# Patient Record
Sex: Male | Born: 1935 | Race: Black or African American | Hispanic: No | Smoking: Never smoker
Health system: Southern US, Community
[De-identification: ages and names within clinical notes are randomized; demographics above are authoritative.]

## PROBLEM LIST (undated history)

## (undated) DIAGNOSIS — E876 Hypokalemia: Secondary | ICD-10-CM

## (undated) DIAGNOSIS — C641 Malignant neoplasm of right kidney, except renal pelvis: Secondary | ICD-10-CM

## (undated) DIAGNOSIS — I472 Ventricular tachycardia: Secondary | ICD-10-CM

## (undated) DIAGNOSIS — F1410 Cocaine abuse, uncomplicated: Secondary | ICD-10-CM

## (undated) DIAGNOSIS — I428 Other cardiomyopathies: Secondary | ICD-10-CM

## (undated) DIAGNOSIS — R9439 Abnormal result of other cardiovascular function study: Secondary | ICD-10-CM

## (undated) DIAGNOSIS — M1990 Unspecified osteoarthritis, unspecified site: Secondary | ICD-10-CM

## (undated) DIAGNOSIS — M545 Low back pain, unspecified: Secondary | ICD-10-CM

## (undated) DIAGNOSIS — I493 Ventricular premature depolarization: Secondary | ICD-10-CM

## (undated) DIAGNOSIS — I10 Essential (primary) hypertension: Secondary | ICD-10-CM

## (undated) DIAGNOSIS — Z9189 Other specified personal risk factors, not elsewhere classified: Secondary | ICD-10-CM

## (undated) DIAGNOSIS — F1010 Alcohol abuse, uncomplicated: Secondary | ICD-10-CM

## (undated) DIAGNOSIS — G8929 Other chronic pain: Secondary | ICD-10-CM

## (undated) HISTORY — PX: TRANSTHORACIC ECHOCARDIOGRAM: SHX275

## (undated) HISTORY — PX: FOREARM FRACTURE SURGERY: SHX649

## (undated) HISTORY — PX: TONSILLECTOMY: SUR1361

## (undated) HISTORY — PX: CARDIAC CATHETERIZATION: SHX172

---
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
## 1957-07-09 HISTORY — PX: LACERATION REPAIR: SHX5168

## 1978-12-31 HISTORY — PX: STERNUM FRACTURE SURGERY: SHX790

## 2003-05-28 ENCOUNTER — Emergency Department (HOSPITAL_COMMUNITY): Admission: EM | Admit: 2003-05-28 | Discharge: 2003-05-29 | Payer: Self-pay | Admitting: *Deleted

## 2003-05-29 ENCOUNTER — Encounter: Payer: Self-pay | Admitting: *Deleted

## 2003-11-30 ENCOUNTER — Ambulatory Visit (HOSPITAL_COMMUNITY): Admission: RE | Admit: 2003-11-30 | Discharge: 2003-11-30 | Payer: Self-pay | Admitting: Family Medicine

## 2004-01-20 ENCOUNTER — Emergency Department (HOSPITAL_COMMUNITY): Admission: EM | Admit: 2004-01-20 | Discharge: 2004-01-20 | Payer: Self-pay | Admitting: Emergency Medicine

## 2004-01-31 ENCOUNTER — Emergency Department (HOSPITAL_COMMUNITY): Admission: EM | Admit: 2004-01-31 | Discharge: 2004-01-31 | Payer: Self-pay | Admitting: Emergency Medicine

## 2004-02-09 ENCOUNTER — Ambulatory Visit (HOSPITAL_COMMUNITY): Admission: RE | Admit: 2004-02-09 | Discharge: 2004-02-09 | Payer: Self-pay | Admitting: Family Medicine

## 2004-02-09 ENCOUNTER — Emergency Department (HOSPITAL_COMMUNITY): Admission: EM | Admit: 2004-02-09 | Discharge: 2004-02-09 | Payer: Self-pay

## 2004-10-11 ENCOUNTER — Emergency Department (HOSPITAL_COMMUNITY): Admission: EM | Admit: 2004-10-11 | Discharge: 2004-10-11 | Payer: Self-pay | Admitting: Emergency Medicine

## 2007-07-16 ENCOUNTER — Emergency Department (HOSPITAL_COMMUNITY): Admission: EM | Admit: 2007-07-16 | Discharge: 2007-07-16 | Payer: Self-pay | Admitting: Emergency Medicine

## 2009-04-07 ENCOUNTER — Encounter: Admission: RE | Admit: 2009-04-07 | Discharge: 2009-04-07 | Payer: Self-pay | Admitting: General Practice

## 2009-11-04 ENCOUNTER — Emergency Department (HOSPITAL_COMMUNITY): Admission: EM | Admit: 2009-11-04 | Discharge: 2009-11-04 | Payer: Self-pay | Admitting: Emergency Medicine

## 2010-02-19 ENCOUNTER — Emergency Department (HOSPITAL_COMMUNITY): Admission: EM | Admit: 2010-02-19 | Discharge: 2010-02-20 | Payer: Self-pay | Admitting: Emergency Medicine

## 2010-03-02 ENCOUNTER — Encounter (INDEPENDENT_AMBULATORY_CARE_PROVIDER_SITE_OTHER): Payer: Self-pay | Admitting: General Surgery

## 2010-03-02 ENCOUNTER — Ambulatory Visit (HOSPITAL_COMMUNITY): Admission: RE | Admit: 2010-03-02 | Discharge: 2010-03-03 | Payer: Self-pay | Admitting: General Surgery

## 2010-05-04 ENCOUNTER — Emergency Department (HOSPITAL_COMMUNITY): Admission: EM | Admit: 2010-05-04 | Discharge: 2010-05-04 | Payer: Self-pay | Admitting: Emergency Medicine

## 2010-08-17 ENCOUNTER — Emergency Department (HOSPITAL_COMMUNITY): Admission: EM | Admit: 2010-08-17 | Discharge: 2010-08-17 | Payer: Self-pay | Admitting: Emergency Medicine

## 2011-03-20 LAB — POCT I-STAT, CHEM 8
BUN: 18 mg/dL (ref 6–23)
Calcium, Ion: 1.2 mmol/L (ref 1.12–1.32)
Chloride: 108 mEq/L (ref 96–112)
Creatinine, Ser: 1.1 mg/dL (ref 0.4–1.5)
Glucose, Bld: 124 mg/dL — ABNORMAL HIGH (ref 70–99)
HCT: 50 % (ref 39.0–52.0)
Hemoglobin: 17 g/dL (ref 13.0–17.0)
Potassium: 3.7 mEq/L (ref 3.5–5.1)
Sodium: 140 mEq/L (ref 135–145)
TCO2: 23 mmol/L (ref 0–100)

## 2011-03-20 LAB — URINALYSIS, ROUTINE W REFLEX MICROSCOPIC
Bilirubin Urine: NEGATIVE
Glucose, UA: NEGATIVE mg/dL
Hgb urine dipstick: NEGATIVE
Ketones, ur: NEGATIVE mg/dL
Nitrite: NEGATIVE
Protein, ur: NEGATIVE mg/dL
Specific Gravity, Urine: 1.019 (ref 1.005–1.030)
Urobilinogen, UA: 0.2 mg/dL (ref 0.0–1.0)
pH: 5 (ref 5.0–8.0)

## 2011-03-21 LAB — BASIC METABOLIC PANEL
BUN: 12 mg/dL (ref 6–23)
CO2: 22 mEq/L (ref 19–32)
Calcium: 9.2 mg/dL (ref 8.4–10.5)
Chloride: 108 mEq/L (ref 96–112)
Creatinine, Ser: 1.14 mg/dL (ref 0.4–1.5)
GFR calc Af Amer: >60 mL/min (ref >60)
GFR calc non Af Amer: >60 mL/min (ref >60)
Glucose, Bld: 130 mg/dL — ABNORMAL HIGH (ref 70–99)
Potassium: 3.7 mEq/L (ref 3.5–5.1)
Sodium: 138 mEq/L (ref 135–145)

## 2011-03-21 LAB — DIFFERENTIAL
Basophils Absolute: 0 10*3/uL (ref 0.0–0.1)
Basophils Relative: 0 % (ref 0–1)
Eosinophils Absolute: 0 10*3/uL (ref 0.0–0.7)
Eosinophils Relative: 0 % (ref 0–5)
Lymphocytes Relative: 38 % (ref 12–46)
Lymphs Abs: 2.1 10*3/uL (ref 0.7–4.0)
Monocytes Absolute: 0.4 10*3/uL (ref 0.1–1.0)
Monocytes Relative: 7 % (ref 3–12)
Neutro Abs: 3 10*3/uL (ref 1.7–7.7)
Neutrophils Relative %: 54 % (ref 43–77)

## 2011-03-21 LAB — CBC
HCT: 46.3 % (ref 39.0–52.0)
Hemoglobin: 15.7 g/dL (ref 13.0–17.0)
MCHC: 34 g/dL (ref 30.0–36.0)
MCV: 93.3 fL (ref 78.0–100.0)
Platelets: 161 10*3/uL (ref 150–400)
RBC: 4.96 MIL/uL (ref 4.22–5.81)
RDW: 13.9 % (ref 11.5–15.5)
WBC: 5.5 10*3/uL (ref 4.0–10.5)

## 2011-03-21 LAB — URINALYSIS, ROUTINE W REFLEX MICROSCOPIC
Bilirubin Urine: NEGATIVE
Glucose, UA: NEGATIVE mg/dL
Hgb urine dipstick: NEGATIVE
Ketones, ur: NEGATIVE mg/dL
Nitrite: NEGATIVE
Protein, ur: NEGATIVE mg/dL
Specific Gravity, Urine: 1.011 (ref 1.005–1.030)
Urobilinogen, UA: 0.2 mg/dL (ref 0.0–1.0)
pH: 5 (ref 5.0–8.0)

## 2011-03-26 LAB — DIFFERENTIAL
Basophils Absolute: 0 10*3/uL (ref 0.0–0.1)
Basophils Relative: 1 % (ref 0–1)
Eosinophils Absolute: 0.1 10*3/uL (ref 0.0–0.7)
Eosinophils Relative: 1 % (ref 0–5)
Lymphocytes Relative: 36 % (ref 12–46)
Lymphs Abs: 1.6 10*3/uL (ref 0.7–4.0)
Monocytes Absolute: 0.4 10*3/uL (ref 0.1–1.0)
Monocytes Relative: 8 % (ref 3–12)
Neutro Abs: 2.5 10*3/uL (ref 1.7–7.7)
Neutrophils Relative %: 54 % (ref 43–77)

## 2011-03-26 LAB — BASIC METABOLIC PANEL
BUN: 13 mg/dL (ref 6–23)
BUN: 7 mg/dL (ref 6–23)
CO2: 25 mEq/L (ref 19–32)
CO2: 25 mEq/L (ref 19–32)
Calcium: 8.6 mg/dL (ref 8.4–10.5)
Calcium: 9.4 mg/dL (ref 8.4–10.5)
Chloride: 104 mEq/L (ref 96–112)
Chloride: 106 mEq/L (ref 96–112)
Creatinine, Ser: 1.12 mg/dL (ref 0.4–1.5)
Creatinine, Ser: 1.15 mg/dL (ref 0.4–1.5)
GFR calc Af Amer: >60 mL/min (ref >60)
GFR calc Af Amer: >60 mL/min (ref >60)
GFR calc non Af Amer: >60 mL/min (ref >60)
GFR calc non Af Amer: >60 mL/min (ref >60)
Glucose, Bld: 119 mg/dL — ABNORMAL HIGH (ref 70–99)
Glucose, Bld: 129 mg/dL — ABNORMAL HIGH (ref 70–99)
Potassium: 3.6 mEq/L (ref 3.5–5.1)
Potassium: 3.7 mEq/L (ref 3.5–5.1)
Sodium: 138 mEq/L (ref 135–145)
Sodium: 140 mEq/L (ref 135–145)

## 2011-03-26 LAB — CBC
HCT: 43.3 % (ref 39.0–52.0)
HCT: 47.5 % (ref 39.0–52.0)
Hemoglobin: 14.9 g/dL (ref 13.0–17.0)
Hemoglobin: 16 g/dL (ref 13.0–17.0)
MCHC: 33.6 g/dL (ref 30.0–36.0)
MCHC: 34.5 g/dL (ref 30.0–36.0)
MCV: 91.3 fL (ref 78.0–100.0)
MCV: 91.5 fL (ref 78.0–100.0)
Platelets: 153 10*3/uL (ref 150–400)
Platelets: 165 10*3/uL (ref 150–400)
RBC: 4.75 MIL/uL (ref 4.22–5.81)
RBC: 5.19 MIL/uL (ref 4.22–5.81)
RDW: 12.9 % (ref 11.5–15.5)
RDW: 13.7 % (ref 11.5–15.5)
WBC: 4.6 10*3/uL (ref 4.0–10.5)
WBC: 8.5 10*3/uL (ref 4.0–10.5)

## 2012-02-01 HISTORY — PX: INGUINAL HERNIA REPAIR: SUR1180

## 2013-07-06 ENCOUNTER — Inpatient Hospital Stay (HOSPITAL_COMMUNITY)
Admission: EM | Admit: 2013-07-06 | Discharge: 2013-07-12 | DRG: 287 | Disposition: A | Discharge status: Home / Self Care | Payer: Medicare Other | Attending: Internal Medicine | Admitting: Internal Medicine

## 2013-07-06 ENCOUNTER — Encounter (HOSPITAL_COMMUNITY): Payer: Self-pay | Admitting: *Deleted

## 2013-07-06 ENCOUNTER — Emergency Department (HOSPITAL_COMMUNITY): Payer: Medicare Other

## 2013-07-06 DIAGNOSIS — I2589 Other forms of chronic ischemic heart disease: Secondary | ICD-10-CM | POA: Diagnosis present

## 2013-07-06 DIAGNOSIS — R7989 Other specified abnormal findings of blood chemistry: Secondary | ICD-10-CM | POA: Insufficient documentation

## 2013-07-06 DIAGNOSIS — I499 Cardiac arrhythmia, unspecified: Secondary | ICD-10-CM

## 2013-07-06 DIAGNOSIS — E876 Hypokalemia: Secondary | ICD-10-CM | POA: Diagnosis not present

## 2013-07-06 DIAGNOSIS — I200 Unstable angina: Secondary | ICD-10-CM | POA: Diagnosis present

## 2013-07-06 DIAGNOSIS — I10 Essential (primary) hypertension: Secondary | ICD-10-CM | POA: Diagnosis present

## 2013-07-06 DIAGNOSIS — I5021 Acute systolic (congestive) heart failure: Secondary | ICD-10-CM | POA: Diagnosis present

## 2013-07-06 DIAGNOSIS — R079 Chest pain, unspecified: Secondary | ICD-10-CM

## 2013-07-06 DIAGNOSIS — I255 Ischemic cardiomyopathy: Secondary | ICD-10-CM | POA: Diagnosis present

## 2013-07-06 DIAGNOSIS — N179 Acute kidney failure, unspecified: Secondary | ICD-10-CM | POA: Diagnosis present

## 2013-07-06 DIAGNOSIS — N289 Disorder of kidney and ureter, unspecified: Secondary | ICD-10-CM | POA: Diagnosis not present

## 2013-07-06 DIAGNOSIS — I509 Heart failure, unspecified: Secondary | ICD-10-CM | POA: Diagnosis present

## 2013-07-06 DIAGNOSIS — I472 Ventricular tachycardia, unspecified: Secondary | ICD-10-CM | POA: Diagnosis not present

## 2013-07-06 DIAGNOSIS — I5041 Acute combined systolic (congestive) and diastolic (congestive) heart failure: Principal | ICD-10-CM | POA: Diagnosis present

## 2013-07-06 DIAGNOSIS — R9439 Abnormal result of other cardiovascular function study: Secondary | ICD-10-CM | POA: Diagnosis present

## 2013-07-06 DIAGNOSIS — I4729 Other ventricular tachycardia: Secondary | ICD-10-CM | POA: Diagnosis not present

## 2013-07-06 DIAGNOSIS — T502X5A Adverse effect of carbonic-anhydrase inhibitors, benzothiadiazides and other diuretics, initial encounter: Secondary | ICD-10-CM | POA: Diagnosis present

## 2013-07-06 DIAGNOSIS — F1410 Cocaine abuse, uncomplicated: Secondary | ICD-10-CM | POA: Diagnosis present

## 2013-07-06 DIAGNOSIS — R799 Abnormal finding of blood chemistry, unspecified: Secondary | ICD-10-CM

## 2013-07-06 DIAGNOSIS — I340 Nonrheumatic mitral (valve) insufficiency: Secondary | ICD-10-CM | POA: Diagnosis present

## 2013-07-06 DIAGNOSIS — I252 Old myocardial infarction: Secondary | ICD-10-CM

## 2013-07-06 DIAGNOSIS — Z9189 Other specified personal risk factors, not elsewhere classified: Secondary | ICD-10-CM | POA: Diagnosis present

## 2013-07-06 DIAGNOSIS — N390 Urinary tract infection, site not specified: Secondary | ICD-10-CM | POA: Diagnosis present

## 2013-07-06 DIAGNOSIS — I493 Ventricular premature depolarization: Secondary | ICD-10-CM | POA: Diagnosis present

## 2013-07-06 DIAGNOSIS — I059 Rheumatic mitral valve disease, unspecified: Secondary | ICD-10-CM | POA: Diagnosis present

## 2013-07-06 DIAGNOSIS — I420 Dilated cardiomyopathy: Secondary | ICD-10-CM

## 2013-07-06 DIAGNOSIS — A498 Other bacterial infections of unspecified site: Secondary | ICD-10-CM | POA: Diagnosis present

## 2013-07-06 DIAGNOSIS — I2510 Atherosclerotic heart disease of native coronary artery without angina pectoris: Secondary | ICD-10-CM | POA: Diagnosis present

## 2013-07-06 HISTORY — DX: Low back pain: M54.5

## 2013-07-06 HISTORY — DX: Other chronic pain: G89.29

## 2013-07-06 HISTORY — DX: Unspecified osteoarthritis, unspecified site: M19.90

## 2013-07-06 HISTORY — DX: Abnormal result of other cardiovascular function study: R94.39

## 2013-07-06 HISTORY — DX: Other specified personal risk factors, not elsewhere classified: Z91.89

## 2013-07-06 HISTORY — DX: Low back pain, unspecified: M54.50

## 2013-07-06 HISTORY — DX: Essential (primary) hypertension: I10

## 2013-07-06 LAB — CBC WITH DIFFERENTIAL/PLATELET
Basophils Absolute: 0 10*3/uL (ref 0.0–0.1)
Basophils Relative: 0 % (ref 0–1)
HCT: 43.7 % (ref 39.0–52.0)
MCHC: 35.2 g/dL (ref 30.0–36.0)
Monocytes Absolute: 0.4 10*3/uL (ref 0.1–1.0)
Neutro Abs: 2.6 10*3/uL (ref 1.7–7.7)
RDW: 13.9 % (ref 11.5–15.5)

## 2013-07-06 LAB — PRO B NATRIURETIC PEPTIDE: Pro B Natriuretic peptide (BNP): 2887 pg/mL — ABNORMAL HIGH (ref 0–125)

## 2013-07-06 LAB — CBC
HCT: 48.5 % (ref 39.0–52.0)
Hemoglobin: 16.9 g/dL (ref 13.0–17.0)
MCHC: 34.8 g/dL (ref 30.0–36.0)
RBC: 5.61 MIL/uL (ref 4.22–5.81)
WBC: 5 10*3/uL (ref 4.0–10.5)

## 2013-07-06 LAB — POCT I-STAT TROPONIN I

## 2013-07-06 LAB — COMPREHENSIVE METABOLIC PANEL
AST: 44 U/L — ABNORMAL HIGH (ref 0–37)
Albumin: 3.4 g/dL — ABNORMAL LOW (ref 3.5–5.2)
Calcium: 9.3 mg/dL (ref 8.4–10.5)
Chloride: 103 mEq/L (ref 96–112)
Creatinine, Ser: 1.28 mg/dL (ref 0.50–1.35)
Total Bilirubin: 0.6 mg/dL (ref 0.3–1.2)

## 2013-07-06 LAB — CREATININE, SERUM
Creatinine, Ser: 1.26 mg/dL (ref 0.50–1.35)
GFR calc Af Amer: 63 mL/min — ABNORMAL LOW (ref >90)
GFR calc non Af Amer: 54 mL/min — ABNORMAL LOW (ref >90)

## 2013-07-06 LAB — TROPONIN I: Troponin I: <0.3 ng/mL (ref <0.30)

## 2013-07-06 LAB — TSH: TSH: 2.391 u[IU]/mL (ref 0.350–4.500)

## 2013-07-06 MED ORDER — SODIUM CHLORIDE 0.9 % IJ SOLN: 3.0000 mL | INTRAMUSCULAR | Status: DC | PRN

## 2013-07-06 MED ORDER — LISINOPRIL 2.5 MG PO TABS
2.5000 mg | ORAL_TABLET | Freq: Every day | ORAL | Status: DC
  Administered 2013-07-06 – 2013-07-08 (×3): 2.5 mg via ORAL
  Filled 2013-07-06 (×3): qty 1

## 2013-07-06 MED ORDER — ENOXAPARIN SODIUM 40 MG/0.4ML ~~LOC~~ SOLN
40.0000 mg | SUBCUTANEOUS | Status: DC
  Administered 2013-07-06 – 2013-07-08 (×3): 40 mg via SUBCUTANEOUS
  Filled 2013-07-06 (×4): qty 0.4

## 2013-07-06 MED ORDER — HYDROCHLOROTHIAZIDE 25 MG PO TABS: 25.0000 mg | ORAL_TABLET | Freq: Every day | ORAL | Status: DC

## 2013-07-06 MED ORDER — CARVEDILOL 3.125 MG PO TABS
3.1250 mg | ORAL_TABLET | Freq: Two times a day (BID) | ORAL | Status: DC
  Administered 2013-07-07 – 2013-07-09 (×5): 3.125 mg via ORAL
  Filled 2013-07-06 (×7): qty 1

## 2013-07-06 MED ORDER — SODIUM CHLORIDE 0.9 % IJ SOLN
3.0000 mL | Freq: Two times a day (BID) | INTRAMUSCULAR | Status: DC
  Administered 2013-07-06 – 2013-07-12 (×11): 3 mL via INTRAVENOUS

## 2013-07-06 MED ORDER — SODIUM CHLORIDE 0.9 % IV SOLN: 250.0000 mL | INTRAVENOUS | Status: DC | PRN

## 2013-07-06 MED ORDER — PNEUMOCOCCAL VAC POLYVALENT 25 MCG/0.5ML IJ INJ
0.5000 mL | INJECTION | INTRAMUSCULAR | Status: AC
  Administered 2013-07-07: 0.5 mL via INTRAMUSCULAR
  Filled 2013-07-06: qty 0.5

## 2013-07-06 MED ORDER — REGADENOSON 0.4 MG/5ML IV SOLN
0.4000 mg | Freq: Once | INTRAVENOUS | Status: AC
  Filled 2013-07-06: qty 5

## 2013-07-06 MED ORDER — FUROSEMIDE 10 MG/ML IJ SOLN
40.0000 mg | Freq: Once | INTRAMUSCULAR | Status: AC
  Administered 2013-07-06: 40 mg via INTRAVENOUS
  Filled 2013-07-06: qty 4

## 2013-07-06 MED ORDER — HYDROCODONE-ACETAMINOPHEN 7.5-325 MG PO TABS: 1.0000 | ORAL_TABLET | Freq: Four times a day (QID) | ORAL | Status: DC | PRN

## 2013-07-06 MED ORDER — ONDANSETRON HCL 4 MG/2ML IJ SOLN: 4.0000 mg | Freq: Four times a day (QID) | INTRAMUSCULAR | Status: DC | PRN

## 2013-07-06 MED ORDER — FUROSEMIDE 40 MG PO TABS
40.0000 mg | ORAL_TABLET | Freq: Two times a day (BID) | ORAL | Status: DC
  Administered 2013-07-06 – 2013-07-08 (×4): 40 mg via ORAL
  Filled 2013-07-06 (×6): qty 1

## 2013-07-06 MED ORDER — ACETAMINOPHEN 325 MG PO TABS: 650.0000 mg | ORAL_TABLET | ORAL | Status: DC | PRN

## 2013-07-06 NOTE — ED Notes (Signed)
Pt is here with chest pain, sob, and near syncope over the last week.
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
## 2013-07-07 ENCOUNTER — Observation Stay (HOSPITAL_COMMUNITY): Payer: Medicare Other

## 2013-07-07 DIAGNOSIS — E876 Hypokalemia: Secondary | ICD-10-CM

## 2013-07-07 DIAGNOSIS — I059 Rheumatic mitral valve disease, unspecified: Secondary | ICD-10-CM

## 2013-07-07 DIAGNOSIS — I509 Heart failure, unspecified: Secondary | ICD-10-CM | POA: Diagnosis present

## 2013-07-07 HISTORY — DX: Hypokalemia: E87.6

## 2013-07-07 HISTORY — PX: TRANSTHORACIC ECHOCARDIOGRAM: SHX275

## 2013-07-07 LAB — TROPONIN I: Troponin I: <0.3 ng/mL (ref <0.30)

## 2013-07-07 LAB — BASIC METABOLIC PANEL
CO2: 25 mEq/L (ref 19–32)
Chloride: 101 mEq/L (ref 96–112)
Creatinine, Ser: 1.28 mg/dL (ref 0.50–1.35)
Potassium: 3.2 mEq/L — ABNORMAL LOW (ref 3.5–5.1)

## 2013-07-07 LAB — PRO B NATRIURETIC PEPTIDE: Pro B Natriuretic peptide (BNP): 4037 pg/mL — ABNORMAL HIGH (ref 0–125)

## 2013-07-07 MED ORDER — REGADENOSON 0.4 MG/5ML IV SOLN
INTRAVENOUS | Status: AC
  Administered 2013-07-07: 0.4 mg via INTRAVENOUS
  Filled 2013-07-07: qty 5

## 2013-07-07 MED ORDER — POTASSIUM CHLORIDE CRYS ER 20 MEQ PO TBCR
40.0000 meq | EXTENDED_RELEASE_TABLET | ORAL | Status: AC
  Administered 2013-07-07 (×2): 40 meq via ORAL
  Filled 2013-07-07 (×2): qty 2

## 2013-07-07 MED ORDER — ASPIRIN EC 81 MG PO TBEC
81.0000 mg | DELAYED_RELEASE_TABLET | Freq: Every day | ORAL | Status: DC
  Administered 2013-07-07 – 2013-07-12 (×6): 81 mg via ORAL
  Filled 2013-07-07 (×6): qty 1

## 2013-07-07 MED ORDER — TECHNETIUM TC 99M SESTAMIBI GENERIC - CARDIOLITE
30.0000 | Freq: Once | INTRAVENOUS | Status: AC | PRN
  Administered 2013-07-07: 30 via INTRAVENOUS

## 2013-07-07 MED ORDER — TECHNETIUM TC 99M SESTAMIBI GENERIC - CARDIOLITE
10.0000 | Freq: Once | INTRAVENOUS | Status: AC | PRN
  Administered 2013-07-07: 10 via INTRAVENOUS

## 2013-07-07 MED ORDER — PANTOPRAZOLE SODIUM 40 MG PO TBEC
40.0000 mg | DELAYED_RELEASE_TABLET | Freq: Every day | ORAL | Status: DC
  Administered 2013-07-08 – 2013-07-12 (×5): 40 mg via ORAL
  Filled 2013-07-07 (×5): qty 1

## 2013-07-07 NOTE — Progress Notes (Signed)
*  PRELIMINARY RESULTS* Echocardiogram 2D Echocardiogram has been performed.  Billy Parker 07/07/2013, 4:42 PM 

## 2013-07-07 NOTE — Progress Notes (Signed)
A&0x4.  Left floor via w/c for stress test..  Denies pain/discomfort.  Wardell Pokorski,RN.

## 2013-07-07 NOTE — Care Management Note (Signed)
    Page 1 of 1   07/07/2013     5:20:48 PM   CARE MANAGEMENT NOTE 07/07/2013  Patient:  Billy Parker, Billy Parker   Account Number:  [id number]  Date Initiated:  07/07/2013  Documentation initiated by:  Tera Mater  Subjective/Objective Assessment:   77yo male admitted with SOB. Pt. lives with spouse.  Has an aide that comes to assist pt. 7 days a week/80hrs total a month.     Action/Plan:   discharge planning   Anticipated DC Date:  07/08/2013   Anticipated DC Plan:  HOME/SELF CARE      DC Planning Services  CM consult      Choice offered to / List presented to:             Status of service:  In process, will continue to follow Medicare Important Message given?   (If response is "NO", the following Medicare IM given date fields will be blank) Date Medicare IM given:   Date Additional Medicare IM given:    Discharge Disposition:    Per UR Regulation:  Reviewed for med. necessity/level of care/duration of stay  If discussed at Long Length of Stay Meetings, dates discussed:    Comments:  07/07/13 1645 In to complete Heart Failure Home health screen.  At this time, the pt. would not like to have home health services.  He has an aide that comes to assist him 7days a week for a few hrs a day.  NCM will continue to follow for further dc needs. Tera Mater, RN, BSN NCM [phone]

## 2013-07-07 NOTE — Progress Notes (Signed)
Subjective:  No SOB or chest pain this am  Objective:  Vital Signs in the last 24 hours: Temp:  [97.5 F (36.4 C)-98.2 F (36.8 C)] 97.7 F (36.5 C) (07/08 1610) Pulse Rate:  [39-84] 48 (07/08 0623) Resp:  [20-28] 20 (07/08 0623) BP: (140-172)/(67-109) 145/72 mmHg (07/08 0623) SpO2:  [93 %-97 %] 96 % (07/08 0623) Weight:  [67.132 kg (148 lb)-68.675 kg (151 lb 6.4 oz)] 67.132 kg (148 lb) (07/08 0623)  Intake/Output from previous day:  Intake/Output Summary (Last 24 hours) at 07/07/13 1018 Last data filed at 07/07/13 0825  Gross per 24 hour  Intake    600 ml  Output   1850 ml  Net  -1250 ml    Physical Exam: General appearance: alert, cooperative and no distress Neck: no carotid bruit and no JVD Lungs: clear to auscultation bilaterally Heart: irregularly irregular rhythm Extremities: extremities normal, atraumatic, no cyanosis or edema Abd: soft/ NT/ND/NABS, no HSM Neuro: Grossly normal.   Rate: 80  Rhythm: normal sinus rhythm, premature ventricular contractions (PVC) and bigemeny  Lab Results:  Recent Labs  07/06/13 1227 07/06/13 1805  WBC 5.0 5.0  HGB 15.4 16.9  PLT 156 123*    Recent Labs  07/06/13 1227 07/06/13 1805 07/07/13 0520  NA 139  --  138  K 3.6  --  3.2*  CL 103  --  101  CO2 22  --  25  GLUCOSE 106*  --  104*  BUN 13  --  17  CREATININE 1.28 1.26 1.28    Recent Labs  07/06/13 1806 07/07/13 0540  TROPONINI <0.30 <0.30   Hepatic Function Panel  Recent Labs  07/06/13 1227  PROT 7.7  ALBUMIN 3.4*  AST 44*  ALT 23  ALKPHOS 66  BILITOT 0.6   No results found for this basename: CHOL,  in the last 72 hours No results found for this basename: INR,  in the last 72 hours  Imaging: Imaging results have been reviewed  Cardiac Studies:  Assessment/Plan:   Principal Problem:   Chest pain Active Problems:   HTN (hypertension)   Acute CHF- SOB with elevated BNP on adm. No CHF on CXR   Frequent PVCs   Elevated brain  natriuretic peptide (BNP) level    PLAN: It appears he had some degree of CHF on admission. He is symptom free after 1.2L diuresis. Myoview today. Echo pending.  Corine Shelter [name] Beeper [phone] 07/07/2013, 10:18 AM   I have seen and evaluated the patient this PM along with Corine Shelter, PA. I agree with his findings, examination as well as impression recommendations.  Unfortunately, his NST results are still not ready.   His Echo has been done, but as yet not ready to be read (will be on the lookout for it).  proBNP was notably elevated -- agree with diuresis ; will be interested in results of studies -- prelim look at black & white NST images is not overly helpful.  While BB is "Best Practice" with CHF, we may need to back off due to bradycardia, & would prefer not to start in setting of decompensated HF. -- hold for now.  Will follow up after studies -- he would probably benefit from additional diuresis regardless & would prefer not d/c today.   MD Time with pt: 10 min  [name],Thao W, M.D., M.S. [facility] [address] [place], [place]  [address]  [phone] Pager # [phone] 07/07/2013 3:46 PM

## 2013-07-07 NOTE — Progress Notes (Addendum)
[facility] PROGRESS NOTE  Billy Parker ZOX:[id number] DOB: [birth year] DOA: 07/06/2013 PCP: Erlinda Hong, MD  Assessment/Plan: #1 chest pain/ PVCs Patient presented with chest pain. Patient also with shortness of breath on minimal exertion. Chest pain improved. Cardiac enzymes negative x2. TSH within normal limits at 2.391. 2-D echo pending. Patient is status post Myoview stress test results are pending. Continue diuretics, Coreg, lisinopril. Will place on aspirin and PPI. Cardiology following and appreciate input and recommendations.  #2 acute CHF exacerbation/elevated BNP Patient presented with shortness of breath on admission. Clinical improvement with diuresis. Patient is -1.25 L over the past 24 hours. 2-D echo pending. Cardiac enzymes negative x2. Patient is status post Myoview stress test with results pending. Continue diuretics, Coreg, lisinopril. Cardiology following.  #3 hypokalemia Secondary to diuretics. Repleted.  #4 hypertension Stable. Continue Coreg and lisinopril.  #5 prophylaxis Protonix for GI prophylaxis. Lovenox for DVT prophylaxis.   Code Status: Full Family Communication: Updated patient no family at bedside. Disposition Plan: Home when medically stable.   Consultants:  Cardiology: Dr. Allyson Sabal 07/06/2013  Procedures:  Myoview stress test 07/07/2013  Chest x-ray 07/06/2013  Antibiotics:  None  HPI/Subjective: Patient states shortness of breath improving. Patient states chest pain and intermittent and improvement.  Objective: Filed Vitals:   07/07/13 1015 07/07/13 1016 07/07/13 1020 07/07/13 1158  BP: 115/97 132/88 135/80 114/72  Pulse:      Temp:      TempSrc:      Resp:      Weight:      SpO2:        Intake/Output Summary (Last 24 hours) at 07/07/13 1426 Last data filed at 07/07/13 0825  Gross per 24 hour  Intake    600 ml  Output   1850 ml  Net  -1250 ml   Filed Weights   07/06/13 1542 07/06/13 1736 07/07/13 0623  Weight:  68.675 kg (151 lb 6.4 oz) 68.675 kg (151 lb 6.4 oz) 67.132 kg (148 lb)    Exam:   General:  NAD  Cardiovascular: RRR  Respiratory: CTAB  Abdomen: Soft/NT/ND/+BS  EXTREMITIES: nO C/C/E  Data Reviewed: Basic Metabolic Panel:  Recent Labs Lab 07/06/13 1227 07/06/13 1805 07/07/13 0520  NA 139  --  138  K 3.6  --  3.2*  CL 103  --  101  CO2 22  --  25  GLUCOSE 106*  --  104*  BUN 13  --  17  CREATININE 1.28 1.26 1.28  CALCIUM 9.3  --  9.2   Liver Function Tests:  Recent Labs Lab 07/06/13 1227  AST 44*  ALT 23  ALKPHOS 66  BILITOT 0.6  PROT 7.7  ALBUMIN 3.4*   No results found for this basename: LIPASE, AMYLASE,  in the last 168 hours No results found for this basename: AMMONIA,  in the last 168 hours CBC:  Recent Labs Lab 07/06/13 1227 07/06/13 1805  WBC 5.0 5.0  NEUTROABS 2.6  --   HGB 15.4 16.9  HCT 43.7 48.5  MCV 85.5 86.5  PLT 156 123*   Cardiac Enzymes:  Recent Labs Lab 07/06/13 1806 07/07/13 0540  TROPONINI <0.30 <0.30   BNP (last 3 results)  Recent Labs  07/06/13 1227 07/07/13 0540  PROBNP 2887.0* 4037.0*   CBG: No results found for this basename: GLUCAP,  in the last 168 hours  No results found for this or any previous visit (from the past 240 hour(s)).   Studies: Dg Chest 2 View  07/06/2013   *  RADIOLOGY REPORT*  Clinical Data: Chest pain, shortness of breath  CHEST - 2 VIEW  Comparison: 11/04/2009  Findings: Cardiomediastinal silhouette is stable.  No acute infiltrate or pleural effusion.  No pulmonary edema.  Bony thorax is stable.  IMPRESSION: No active disease.  No significant change.   Original Report Authenticated By: Natasha Mead, M.D.    Scheduled Meds: . carvedilol  3.125 mg Oral BID WC  . enoxaparin (LOVENOX) injection  40 mg Subcutaneous Q24H  . furosemide  40 mg Oral BID  . lisinopril  2.5 mg Oral Daily  . sodium chloride  3 mL Intravenous Q12H   Continuous Infusions:   Principal Problem:   Chest pain Active  Problems:   Frequent PVCs   Elevated brain natriuretic peptide (BNP) level   HTN (hypertension)   Acute CHF- SOB with elevated BNP on adm. No CHF on CXR   Hypokalemia    Time spent: > 35 mins    [facility]  [facility] Pager [phone]. If 7PM-7AM, please contact night-coverage at [email or web address], password [facility] 07/07/2013, 2:26 PM  LOS: 1 day

## 2013-07-08 ENCOUNTER — Encounter (HOSPITAL_COMMUNITY): Payer: Self-pay | Admitting: Pharmacy Technician

## 2013-07-08 ENCOUNTER — Encounter (HOSPITAL_COMMUNITY): Payer: Self-pay | Admitting: Cardiology

## 2013-07-08 DIAGNOSIS — R9439 Abnormal result of other cardiovascular function study: Secondary | ICD-10-CM

## 2013-07-08 DIAGNOSIS — N179 Acute kidney failure, unspecified: Secondary | ICD-10-CM

## 2013-07-08 DIAGNOSIS — I428 Other cardiomyopathies: Secondary | ICD-10-CM

## 2013-07-08 DIAGNOSIS — I255 Ischemic cardiomyopathy: Secondary | ICD-10-CM | POA: Diagnosis present

## 2013-07-08 DIAGNOSIS — I4949 Other premature depolarization: Secondary | ICD-10-CM

## 2013-07-08 DIAGNOSIS — I5021 Acute systolic (congestive) heart failure: Secondary | ICD-10-CM

## 2013-07-08 HISTORY — DX: Other cardiomyopathies: I42.8

## 2013-07-08 HISTORY — DX: Abnormal result of other cardiovascular function study: R94.39

## 2013-07-08 LAB — BASIC METABOLIC PANEL
Calcium: 9.4 mg/dL (ref 8.4–10.5)
Chloride: 103 mEq/L (ref 96–112)
Creatinine, Ser: 1.56 mg/dL — ABNORMAL HIGH (ref 0.50–1.35)
GFR calc Af Amer: 49 mL/min — ABNORMAL LOW (ref >90)
Sodium: 139 mEq/L (ref 135–145)

## 2013-07-08 MED ORDER — ASPIRIN 81 MG PO CHEW
324.0000 mg | CHEWABLE_TABLET | ORAL | Status: AC
  Administered 2013-07-09: 324 mg via ORAL
  Filled 2013-07-08: qty 4

## 2013-07-08 MED ORDER — SODIUM CHLORIDE 0.9 % IJ SOLN
3.0000 mL | Freq: Two times a day (BID) | INTRAMUSCULAR | Status: DC
  Administered 2013-07-08: 3 mL via INTRAVENOUS

## 2013-07-08 MED ORDER — DIAZEPAM 2 MG PO TABS: 2.0000 mg | ORAL_TABLET | ORAL | Status: DC

## 2013-07-08 MED ORDER — SODIUM CHLORIDE 0.9 % IJ SOLN: 3.0000 mL | INTRAMUSCULAR | Status: DC | PRN

## 2013-07-08 MED ORDER — SODIUM CHLORIDE 0.9 % IV SOLN: INTRAVENOUS | Status: DC

## 2013-07-08 MED ORDER — SODIUM CHLORIDE 0.9 % IV SOLN: 250.0000 mL | INTRAVENOUS | Status: DC | PRN

## 2013-07-08 NOTE — Progress Notes (Signed)
Subjective: Improved SOB but still present, some while lying in the bed.  No chest pain.  Objective: Vital signs in last 24 hours: Temp:  [97.4 F (36.3 C)-98.2 F (36.8 C)] 97.5 F (36.4 C) (07/09 4540) Pulse Rate:  [64-70] 66 (07/09 0608) Resp:  [18-20] 20 (07/09 0608) BP: (107-149)/(53-97) 149/67 mmHg (07/09 0608) SpO2:  [98 %-99 %] 98 % (07/09 0608) Weight:  [148 lb 13 oz (67.5 kg)] 148 lb 13 oz (67.5 kg) (07/09 9811) Weight change: -2 lb 9.4 oz (-1.175 kg) Last BM Date: 07/07/13 Intake/Output from previous day: -60 (-1760 for hosp stay)  Wt 148.13 down from 151.6 on admit 07/08 0701 - 07/09 0700 In: 340 [P.O.:340] Out: 850 [Urine:850] Intake/Output this shift: Total I/O In: 120 [P.O.:120] Out: -   PE: General:Pleasant affect, NAD Neck:supple, no JVD- up in chair  Heart:S1S2 RRR with premature beats, without murmur, gallup, rub or click Lungs:clear without rales, rhonchi, or wheezes BJY:NWGN, non tender, + BS, do not palpate liver spleen or masses Ext:no lower ext edema Neuro:alert and oriented, MAE, follows commands, + facial symmetry  TELE: freq PVCs, big. At times,  PACs and one run ? PAF short burst approx 6 sec.    Lab Results:  Recent Labs  07/06/13 1227 07/06/13 1805  WBC 5.0 5.0  HGB 15.4 16.9  HCT 43.7 48.5  PLT 156 123*   BMET  Recent Labs  07/07/13 0520 07/08/13 0630  NA 138 139  K 3.2* 4.2  CL 101 103  CO2 25 25  GLUCOSE 104* 109*  BUN 17 20  CREATININE 1.28 1.56*  CALCIUM 9.2 9.4    Recent Labs  07/06/13 1806 07/07/13 0540  TROPONINI <0.30 <0.30    No results found for this basename: CHOL,  HDL,  LDLCALC,  LDLDIRECT,  TRIG,  CHOLHDL   No results found for this basename: HGBA1C     Lab Results  Component Value Date   TSH 2.391 07/06/2013    Hepatic Function Panel  Recent Labs  07/06/13 1227  PROT 7.7  ALBUMIN 3.4*  AST 44*  ALT 23  ALKPHOS 66  BILITOT 0.6    Studies/Results: Dg Chest 2 View  07/06/2013    *RADIOLOGY REPORT*  Clinical Data: Chest pain, shortness of breath  CHEST - 2 VIEW  Comparison: 11/04/2009  Findings: Cardiomediastinal silhouette is stable.  No acute infiltrate or pleural effusion.  No pulmonary edema.  Bony thorax is stable.  IMPRESSION: No active disease.  No significant change.   Original Report Authenticated By: Natasha Mead, M.D.   Nm Myocar Multi W/spect W/wall Motion / Ef  07/07/2013   *RADIOLOGY REPORT*  Clinical Data:  Chest pain  MYOCARDIAL IMAGING WITH SPECT (REST AND PHARMACOLOGIC-STRESS) GATED LEFT VENTRICULAR WALL MOTION STUDY LEFT VENTRICULAR EJECTION FRACTION  Technique:  Standard myocardial SPECT imaging was performed after resting intravenous injection of 10 mCi Tc-79m sestamibi. Subsequently, intravenous infusion of Lexiscan was performed under the supervision of the Cardiology staff.  At peak effect of the drug, 30 mCi Tc-82m sestamibi was injected intravenously and standard myocardial SPECT  imaging was performed.  Quantitative gated imaging was also performed to evaluate left ventricular wall motion, and estimate left ventricular ejection fraction.  Comparison:  None.  Findings: SPECT imaging demonstrates large fixed defect in the inferior wall compatible with old infarct/scar.  No significant periinfarct ischemia.  Quantitative gated analysis shows severe diffuse hypokinesia. Akinesia in the inferior wall.  The resting left ventricular ejection fraction  is 29% with end- diastolic volume of 165 ml and end-systolic volume of 117 ml.  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  No evidence for significant inducible ischemia.  Ejection fraction 29%.   Original Report Authenticated By: Charlett Nose, M.D.   2D Echo: Frequent ectopy was noted on this study, making this study technically difficult for the assessment of cardiac function. Consistent with severe coronary artery disease, predominantly involving the RCA. Ischemic cardiomyopathy due to prior infarction  with congestive heart failure, with reduced cardiac output. The dysfunction is primarily systolic. The estimated ejection fraction was in the range of 25% to 30%.   Medications: I have reviewed the patient's current medications. Scheduled Meds: . aspirin EC  81 mg Oral Daily  . carvedilol  3.125 mg Oral BID WC  . enoxaparin (LOVENOX) injection  40 mg Subcutaneous Q24H  . furosemide  40 mg Oral BID  . lisinopril  2.5 mg Oral Daily  . pantoprazole  40 mg Oral Q0600  . sodium chloride  3 mL Intravenous Q12H   Continuous Infusions:  PRN Meds:.sodium chloride, acetaminophen, HYDROcodone-acetaminophen, ondansetron (ZOFRAN) IV, sodium chloride  Assessment/Plan: Principal Problem:   Chest pain Active Problems:   Frequent PVCs   Elevated brain natriuretic peptide (BNP) level   HTN (hypertension)   Hypokalemia   Cardiomyopathy, ischemic, EF 25-30%   Acute systolic HF (heart failure)   Abnormal nuclear stress test, 07/07/13 large scar no ischemia  PLAN:  With low EF pt is candidate for life vest.  ? Cardiac cath to further determine CAD- MD to see.  Cr. Elevated today.  Is on po lasix. K+ level WNL after replacing.  If pt for cath would hold Lasix.   When asked family history -pt now remembers his sister recently had CABG after MI.   LOS: 2 days   Time spent with pt. :20 minutes. [facility] R  Nurse Practitioner Certified Pager [phone] 07/08/2013, 9:17 AM

## 2013-07-08 NOTE — Progress Notes (Addendum)
Pt. Seen and examined. Agree with the NP/[name] note as written. He recalls feeling like he was hit with a hammer in his chest when it was cold (this past winter) and he stayed in bed for 2 days. 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 Nuclear stress test shows a fixed inferior scar without ischemia, EF 29%. Echo agrees with this, EF 25-30%.  Can't absolutely rule-out multivessel CAD. Creatinine is rising today, BNP also increased? I would recommend [facility] tomorrow to assess the coronaries for possible revascularization targets and filling pressures as to get an idea of his volume status. I discussed the risk and benefits of L/RHC with him today and he understands and agrees to the procedure.  Agree to hold lasix and ACE-I before cath.  Chrystie Nose, MD, [facility] Attending Cardiologist [facility]

## 2013-07-08 NOTE — Progress Notes (Signed)
Pt going for cath 7/10, cath pamphlet given to pt

## 2013-07-08 NOTE — Plan of Care (Signed)
Problem: Phase I Progression Outcomes Goal: Anginal pain relieved Outcome: Completed/Met Date Met:  07/08/13 Pt has not had any c/o anginal pain Goal: Aspirin unless contraindicated Outcome: Completed/Met Date Met:  07/08/13 Pt on ASA

## 2013-07-08 NOTE — Progress Notes (Addendum)
[facility] PROGRESS NOTE  Billy Parker WUJ:[id number] DOB: 27-Jun-1936 DOA: 07/06/2013 PCP: Billy Hong, MD  Assessment/Plan: #1 chest pain/ PVCs Patient presented with chest pain. Patient also with shortness of breath on minimal exertion. Chest pain improved. Cardiac enzymes negative x2. TSH within normal limits at 2.391. 2-D echo pending. - Myoview stress test shows large old infarct/scar in the inferior wall. No evidence of significant NG tube will ischemia. EF 29%  -Continue Coreg and aspirin -Appreciate cardiology assistance, cath planned in am -diuretics and lisinopril on hold per cards prior to cath.  #2 acute CHF exacerbation/elevated BNP Patient presented with shortness of breath on admission. Improved clinically following diuresis with -1.25 L  Over there the initial 24 hours.  -2-D echo with EF 25-30%, severe hypokinesis and scarring of the mid inferior myocardium noted, grade 2 diastolic dysfunction-see full report. -Cath planned in a.m. as above, and diuretics on her right cath as above #3 hypokalemia Secondary to diuretics. Repleted.  #4 hypertension Stable. Continue Coreg, lisinopril on hold for now prior to cath.  #5 ARF. -Creatinine 1.56, agree with holding diuretics and lisinopril as  #6 prophylaxis Protonix for GI prophylaxis. Lovenox for DVT prophylaxis.   Code Status: Full Family Communication: Updated patient no family at bedside. Disposition Plan: Home when medically stable.   Consultants:  Cardiology: Dr. Allyson Parker 07/06/2013  Procedures:  Myoview stress test 07/07/2013  Chest x-ray 07/06/2013  Antibiotics:  None  HPI/Subjective: States he has some shortness of breath last p.m. but breathing better today. He denies chest pain  Objective: Filed Vitals:   07/07/13 1728 07/07/13 2111 07/08/13 0608 07/08/13 1328  BP: 124/70 124/53 149/67 110/55  Pulse: 67 64 66 65  Temp:  98.2 F (36.8 Parker) 97.5 F (36.4 Parker) 97.9 F (36.6 Parker)  TempSrc:   Oral Oral Oral  Resp:  20 20 18   Weight:   67.5 kg (148 lb 13 oz)   SpO2:  99% 98% 98%    Intake/Output Summary (Last 24 hours) at 07/08/13 1534 Last data filed at 07/08/13 1242  Gross per 24 hour  Intake    580 ml  Output    850 ml  Net   -270 ml   Filed Weights   07/06/13 1736 07/07/13 0623 07/08/13 0608  Weight: 68.675 kg (151 lb 6.4 oz) 67.132 kg (148 lb) 67.5 kg (148 lb 13 oz)    Exam:   General:  NAD  Cardiovascular: RRR  Respiratory: CTAB  Abdomen: Soft/NT/ND/+BS  EXTREMITIES: nO Parker/Parker/E  Data Reviewed: Basic Metabolic Panel:  Recent Labs Lab 07/06/13 1227 07/06/13 1805 07/07/13 0520 07/08/13 0630  NA 139  --  138 139  K 3.6  --  3.2* 4.2  CL 103  --  101 103  CO2 22  --  25 25  GLUCOSE 106*  --  104* 109*  BUN 13  --  17 20  CREATININE 1.28 1.26 1.28 1.56*  CALCIUM 9.3  --  9.2 9.4   Liver Function Tests:  Recent Labs Lab 07/06/13 1227  AST 44*  ALT 23  ALKPHOS 66  BILITOT 0.6  PROT 7.7  ALBUMIN 3.4*   No results found for this basename: LIPASE, AMYLASE,  in the last 168 hours No results found for this basename: AMMONIA,  in the last 168 hours CBC:  Recent Labs Lab 07/06/13 1227 07/06/13 1805  WBC 5.0 5.0  NEUTROABS 2.6  --   HGB 15.4 16.9  HCT 43.7 48.5  MCV 85.5 86.5  PLT 156  123*   Cardiac Enzymes:  Recent Labs Lab 07/06/13 1806 07/07/13 0540  TROPONINI <0.30 <0.30   BNP (last 3 results)  Recent Labs  07/06/13 1227 07/07/13 0540  PROBNP 2887.0* 4037.0*   CBG: No results found for this basename: GLUCAP,  in the last 168 hours  No results found for this or any previous visit (from the past 240 hour(s)).   Studies: Nm Myocar Multi W/spect W/wall Motion / Ef  07/07/2013   *RADIOLOGY REPORT*  Clinical Data:  Chest pain  MYOCARDIAL IMAGING WITH SPECT (REST AND PHARMACOLOGIC-STRESS) GATED LEFT VENTRICULAR WALL MOTION STUDY LEFT VENTRICULAR EJECTION FRACTION  Technique:  Standard myocardial SPECT imaging was performed  after resting intravenous injection of 10 mCi Tc-88m sestamibi. Subsequently, intravenous infusion of Lexiscan was performed under the supervision of the Cardiology staff.  At peak effect of the drug, 30 mCi Tc-44m sestamibi was injected intravenously and standard myocardial SPECT  imaging was performed.  Quantitative gated imaging was also performed to evaluate left ventricular wall motion, and estimate left ventricular ejection fraction.  Comparison:  None.  Findings: SPECT imaging demonstrates large fixed defect in the inferior wall compatible with old infarct/scar.  No significant periinfarct ischemia.  Quantitative gated analysis shows severe diffuse hypokinesia. Akinesia in the inferior wall.  The resting left ventricular ejection fraction is 29% with end- diastolic volume of 165 ml and end-systolic volume of 117 ml.  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  No evidence for significant inducible ischemia.  Ejection fraction 29%.   Original Report Authenticated By: Billy Parker, M.D.    Scheduled Meds: . aspirin EC  81 mg Oral Daily  . carvedilol  3.125 mg Oral BID WC  . enoxaparin (LOVENOX) injection  40 mg Subcutaneous Q24H  . pantoprazole  40 mg Oral Q0600  . sodium chloride  3 mL Intravenous Q12H   Continuous Infusions:   Principal Problem:   Chest pain Active Problems:   Frequent PVCs   Elevated brain natriuretic peptide (BNP) level   HTN (hypertension)   Hypokalemia   Cardiomyopathy, ischemic, EF 25-30%   Acute systolic HF (heart failure)   Abnormal nuclear stress test, 07/07/13 large scar no ischemia    Time spent: > 35 mins    Billy Parker  [facility] Pager [phone]. If 7PM-7AM, please contact night-coverage at [email or web address], password [facility] 07/08/2013, 3:34 PM  LOS: 2 days

## 2013-07-09 ENCOUNTER — Encounter (HOSPITAL_COMMUNITY)
Admission: EM | Disposition: A | Discharge status: Home / Self Care | Payer: Self-pay | Source: Home / Self Care | Attending: Internal Medicine

## 2013-07-09 ENCOUNTER — Ambulatory Visit (HOSPITAL_COMMUNITY): Admission: RE | Admit: 2013-07-09 | Payer: Medicare Other | Source: Ambulatory Visit | Admitting: Cardiology

## 2013-07-09 DIAGNOSIS — I472 Ventricular tachycardia: Secondary | ICD-10-CM | POA: Diagnosis not present

## 2013-07-09 DIAGNOSIS — I2589 Other forms of chronic ischemic heart disease: Secondary | ICD-10-CM

## 2013-07-09 DIAGNOSIS — N289 Disorder of kidney and ureter, unspecified: Secondary | ICD-10-CM | POA: Diagnosis not present

## 2013-07-09 DIAGNOSIS — I4729 Other ventricular tachycardia: Secondary | ICD-10-CM

## 2013-07-09 HISTORY — PX: LEFT AND RIGHT HEART CATHETERIZATION WITH CORONARY ANGIOGRAM: SHX5449

## 2013-07-09 HISTORY — DX: Other ventricular tachycardia: I47.29

## 2013-07-09 HISTORY — DX: Ventricular tachycardia: I47.2

## 2013-07-09 LAB — CBC
Platelets: 123 10*3/uL — ABNORMAL LOW (ref 150–400)
RDW: 14.4 % (ref 11.5–15.5)
WBC: 4.1 10*3/uL (ref 4.0–10.5)

## 2013-07-09 LAB — POCT I-STAT 3, VENOUS BLOOD GAS (G3P V)
O2 Saturation: 62 %
TCO2: 25 mmol/L (ref 0–100)
pH, Ven: 7.357 — ABNORMAL HIGH (ref 7.250–7.300)

## 2013-07-09 LAB — URINALYSIS, ROUTINE W REFLEX MICROSCOPIC
Ketones, ur: NEGATIVE mg/dL
Nitrite: NEGATIVE
Protein, ur: 100 mg/dL — AB
Specific Gravity, Urine: 1.018 (ref 1.005–1.030)
Urobilinogen, UA: 0.2 mg/dL (ref 0.0–1.0)

## 2013-07-09 LAB — MAGNESIUM: Magnesium: 1.9 mg/dL (ref 1.5–2.5)

## 2013-07-09 LAB — BASIC METABOLIC PANEL
Calcium: 9 mg/dL (ref 8.4–10.5)
GFR calc non Af Amer: 54 mL/min — ABNORMAL LOW (ref >90)
Sodium: 139 mEq/L (ref 135–145)

## 2013-07-09 LAB — POCT I-STAT 3, ART BLOOD GAS (G3+)
Acid-base deficit: 1 mmol/L (ref 0.0–2.0)
O2 Saturation: 90 %
TCO2: 24 mmol/L (ref 0–100)
pCO2 arterial: 34 mmHg — ABNORMAL LOW (ref 35.0–45.0)

## 2013-07-09 LAB — PRO B NATRIURETIC PEPTIDE: Pro B Natriuretic peptide (BNP): 686.9 pg/mL — ABNORMAL HIGH (ref 0–450)

## 2013-07-09 LAB — URINE MICROSCOPIC-ADD ON

## 2013-07-09 LAB — PROTIME-INR
INR: 1.02 (ref 0.00–1.49)
Prothrombin Time: 13.2 seconds (ref 11.6–15.2)

## 2013-07-09 SURGERY — LEFT AND RIGHT HEART CATHETERIZATION WITH CORONARY ANGIOGRAM
Anesthesia: LOCAL

## 2013-07-09 MED ORDER — CARVEDILOL 6.25 MG PO TABS
6.2500 mg | ORAL_TABLET | Freq: Two times a day (BID) | ORAL | Status: DC
  Administered 2013-07-09 – 2013-07-12 (×6): 6.25 mg via ORAL
  Filled 2013-07-09 (×8): qty 1

## 2013-07-09 MED ORDER — POTASSIUM CHLORIDE CRYS ER 20 MEQ PO TBCR
20.0000 meq | EXTENDED_RELEASE_TABLET | Freq: Once | ORAL | Status: AC
  Administered 2013-07-09: 20 meq via ORAL

## 2013-07-09 MED ORDER — SODIUM CHLORIDE 0.9 % IJ SOLN: 3.0000 mL | INTRAMUSCULAR | Status: DC | PRN

## 2013-07-09 MED ORDER — SODIUM CHLORIDE 0.9 % IV SOLN: 250.0000 mL | INTRAVENOUS | Status: DC | PRN

## 2013-07-09 MED ORDER — HEPARIN (PORCINE) IN NACL 2-0.9 UNIT/ML-% IJ SOLN
INTRAMUSCULAR | Status: AC
  Filled 2013-07-09: qty 1000

## 2013-07-09 MED ORDER — ENOXAPARIN SODIUM 40 MG/0.4ML ~~LOC~~ SOLN
40.0000 mg | SUBCUTANEOUS | Status: DC
  Administered 2013-07-10 – 2013-07-12 (×3): 40 mg via SUBCUTANEOUS
  Filled 2013-07-09 (×4): qty 0.4

## 2013-07-09 MED ORDER — MIDAZOLAM HCL 2 MG/2ML IJ SOLN
INTRAMUSCULAR | Status: AC
  Filled 2013-07-09: qty 2

## 2013-07-09 MED ORDER — FENTANYL CITRATE 0.05 MG/ML IJ SOLN
INTRAMUSCULAR | Status: AC
  Filled 2013-07-09: qty 2

## 2013-07-09 MED ORDER — SODIUM CHLORIDE 0.9 % IJ SOLN
3.0000 mL | Freq: Two times a day (BID) | INTRAMUSCULAR | Status: DC
  Administered 2013-07-09 – 2013-07-11 (×5): 3 mL via INTRAVENOUS

## 2013-07-09 MED ORDER — NITROGLYCERIN 0.2 MG/ML ON CALL CATH LAB
INTRAVENOUS | Status: AC
  Filled 2013-07-09: qty 1

## 2013-07-09 MED ORDER — SODIUM CHLORIDE 0.9 % IV SOLN: 1.0000 mL/kg/h | INTRAVENOUS | Status: AC

## 2013-07-09 MED ORDER — LIDOCAINE HCL (PF) 1 % IJ SOLN
INTRAMUSCULAR | Status: AC
  Filled 2013-07-09: qty 30

## 2013-07-09 NOTE — Interval H&P Note (Signed)
History and Physical Interval Note:  07/09/2013 9:07 AM  Billy Parker  has presented today for surgery, with the diagnosis of Chest pain / Unstable Angina, Ischemic Cardiomyopathy by Echo & Nuclear ST.  The various methods of treatment have been discussed with the patient and family. After consideration of risks, benefits and other options for treatment, the patient has consented to  Procedure(s): LEFT AND RIGHT HEART CATHETERIZATION WITH CORONARY ANGIOGRAM (N/A) AND POSSIBLE PERCUTANEOUS CORONARY INTERVENTION as a surgical intervention .  Marland Kitchen  The patient's history has been reviewed, patient examined, no change in status, stable for surgery.  I have reviewed the patient's chart and labs.  Questions were answered to the patient's satisfaction.    Cath Lab Visit (complete for each Cath Lab visit)  Clinical Evaluation Leading to the Procedure:   ACS: yes; Unstable Angina  Non-ACS:    Anginal Classification: CCS IV; occurred at rest  Anti-ischemic medical therapy: Minimal Therapy (1 class of medications)  Non-Invasive Test Results: Intermediate-risk stress test findings: cardiac mortality 1-3%/year; EF 29% with evidence of prior Inferior Infarct; confirmed by Echocardiogram  Prior CABG: No previous CABG  Marykay Lex, MD

## 2013-07-09 NOTE — CV Procedure (Addendum)
CARDIAC CATHETERIZATION REPORT  NAME:  Billy Parker   MRN: [id number] DOB:  1936-04-12   ADMIT DATE: 07/06/2013 Procedure Date: 07/09/2013  INTERVENTIONAL CARDIOLOGIST: Marykay Lex, M.D., MS PRIMARY CARE PROVIDER: Erlinda Hong, MD PRIMARY CARDIOLOGIST: Marykay Lex, M.D., MS  PATIENT:  Billy Parker is a 77 y.o. male with history of hypertension, but no prior cardiac history he was admitted with the sudden onset chest pain and shortness of breath. The pain, came on over 2 days. He was noted to have an elevated proBNP suggestive of heart failure. He improved significantly with diuresis. An echocardiogram revealed what appeared to be ischemic cardiomyopathy, EF of 20-30%, with a inferior hypokinesis and infarction. This was consistent with a PET scan Myoview also indicated EF of roughly 29% and inferior scar.  He has had frequent ectopy and nonsustained ventricular tachycardia. After reviewing the echocardiogram and stress test results, Dr. Rennis Golden felt it was vital to evaluate for any existing coronary disease to delineate his anatomy.  The non-invasive studies both suggest RCA occlusion / RCA Infarction. It is important also note that the patient does have a history of crack cocaine use, most recently on July 3 when he first noticed prolonged chest discomfort. He is also been noting significant bursts of thumping in his chest which may be consistent with his arrhythmias noted on monitor.  PRE-OPERATIVE DIAGNOSIS:    Presumed Ischemic Cardiomyopathy, with evidence of prior RCA infarct  Nonsustained Ventricular Tachycardia  PROCEDURES PERFORMED:    Left and Right Heart Catheterization with Coronary Angiography  PROCEDURE:Consent:  Risks of procedure as well as the alternatives and risks of each were explained to the (patient/caregiver).  Consent for procedure obtained. Consent for signed by MD and patient with RN witness -- placed on chart.   PROCEDURE: The patient was brought to the 2nd  Floor [facility] in the fasting state and prepped and draped in the usual sterile fashion for Right groin or radial access. A modified Allen's test with plethysmography was performed, revealing excellent Ulnar artery collateral flow.  Sterile technique was used including antiseptics, cap, gloves, gown, hand hygiene, mask and sheet.  Skin prep: Chlorhexidine.  Time Out: Verified patient identification, verified procedure, site/side was marked, verified correct patient position, special equipment/implants available, medications/allergies/relevent history reviewed, required imaging and test results available.  Performed  Access:   Right Common Femoral Artery: 5 Fr Sheath -- fluoroscopically guided modified Seldinger technique   Right Common Femoral Vein: 7 Fr Sheath - Seldinger technique  Right Heart Catheterization:  A 7 Fr Theone Murdoch Catheter was advanced through the sheath, and with the balloon inflated, was advanced under fluoroscopic guidance into the first the Right Atrium, then through the Right Ventricle into the Main Pulmonary Artery and into the Wedge position.  Hemodynamics measurements were obtained in each location. Simultaneous Oxygen saturation measurements were recorded in both the Pulmonary Artery and the (Femoral / Radial) Artery. An Thermodilution injections were performed to calculate Cardiac Output.  Then the catheter removed completely out of the body with the balloon deflated after simultaneous left ventricular and right ventricular pressures were monitored.  Diagnostic Left Heart Catheterization:  Angled Pigtail, JL4, JR 4  Left Coronary Artery Angiography: JL4  Right Coronary Artery Angiography: JR 4  LV Hemodynamics: Angled pigtail  Sheath:  To be removed in the holding area with manual pressure for hemostasis.   MEDICATIONS:  Anesthesia:  Local Lidocaine 12 ml  Sedation:  1 mg IV Versed, 25 mcg IV fentanyl ;  Premedication: 2 mg  oral Valium  Hemodynamics:  Findings: Of note, the pressures were very dependent on the patient's rhythm. Post-PVC beats lead to PA/RA pressures in the 50s however normal sinus beats pressures are more in the 40 mmHg range.   SaO2% Pressures mmHg Mean P mmHg EDP mmHg      Right Atrium   5/0    3       Right Ventricle   57/2    5       Pulmonary Artery  62   40/13 (average)   21        PCWP   11   11        Central Aortic  90   139/52   78        Left Ventricle   136/6    6-10          Cardiac Output:  Cardiac Index:       Fick  4.2    2.4        Thermodilution  3.1    1.70     Left Ventriculography: Not performed  Coronary Anatomy:  Left Main: Large-caliber, short vessel that trifurcates into a large LAD and Circumflex as well as a small caliber Ramus Intermedius. LAD: Large caliber, very tortuous vessel that gives off a small proximal diagonal followed by a moderate to large caliber major second diagonal branch that covers a large portion of the anterolateral wall. Antegrade with normal. The LAD the tortuous route as it reaches down to and wraps around the apex, perfusing the distal inferoapex. Angiographically normal.  D1: Small-caliber, angiographically normal..  D2: Moderate large-caliber vessel that bifurcates into 2 moderate caliber branches. Angiographically normal.  Left Circumflex: Large-caliber vessel which takes a acute angle takeoff from the left main. Then has a hairpin turn as it goes into the AV groove. There is a small first of his marginal before the stent. After the hairpin turn it bifurcates into a large caliber lateral OM that reaches down the inferior lateral wall to the apex and moderate to large caliber AV groove circumflex that trifurcates distally into a 3 small posterolateral branches. Angiographically normal.  Ramus intermedius:  Small to moderate caliber vessel that covers a small portion of the inferolateral wall. Angiographically normal. 10   RCA:  Large caliber, dominant vessel that terminates as the RPDA. Several RV marginal branches are noted. There is no occlusive disease. No evidence of any significant stenosis to explain the patient's inferior infarct.    PATIENT DISPOSITION:     The patient was transferred to the PACU holding area in a hemodynamicaly stable, chest pain free condition.  The patient tolerated the procedure well, and there were no complications.  EBL:   < 10 ml  The patient was stable before, during, and after the procedure.  POST-OPERATIVE DIAGNOSIS:    Echocardiographically, and Myocardial Perfusion Scanning determined Ischemic Cardiomyopathy (with possibly recent MI), but no evidence angiographically of his obstructive coronary disease.  Based on his history of recent crack cocaine use, one consideration would be intense, prolonged spasm resulting in (recent) RCA infarction with spontaneous reperfusion after resolution of the spasm.  This would explain the elevated proBNP.  A Troponin washout with reperfusion led to negative Troponin levels on admission  Moderate to severely reduced cardiac output and index.  Right Heart Catheterization & LVEDP values suggest adequate diuresis.  PLAN OF CARE:  Return to the patient's room.   He may need standing dose  of oral Lasix for discharge, but is adequately diuresis point.  I do concur with a need for considering LifeVest prior to discharge. We will send off the order today and hopefully this can be placed in adequate time to allow for him to be discharged in the next 1-2 days.  Continue to titrate cardiac medications. Would also consider the use of amlodipine versus long-acting nitrate for antispasm medication.  I counseled the patient sternly, and this needs to be reiterated the importance of no further use of cocaine either crack powder form. I explained to the patient that he will need to be on medication but would have severe interaction with cocaine, insinuating  the beta-blockade that he requires for his arrhythmias.  He will need close cardiology followup, and a followup echocardiogram in roughly 3 months to see if he has improvement of his heart overall cardiac function. Unfortunately he will also need to prove being consistently abstinent from use of illicit substances.    Marykay Lex, M.D., M.S. [facility] [address] [place], [place]  [address]  [phone]  07/09/2013 2:45 PM

## 2013-07-09 NOTE — Progress Notes (Signed)
Received from vascular lab s/p cardiac cath, dry dressing to right femoral area.

## 2013-07-09 NOTE — Progress Notes (Signed)
Subjective:  No SOB or chest pain overnight  Objective:  Vital Signs in the last 24 hours: Temp:  [97.9 F (36.6 C)-98.1 F (36.7 C)] 97.9 F (36.6 C) (07/10 0450) Pulse Rate:  [65-96] 95 (07/10 0450) Resp:  [18-20] 20 (07/10 0450) BP: (110-122)/(55-60) 115/60 mmHg (07/10 0450) SpO2:  [96 %-98 %] 96 % (07/10 0450) Weight:  [69.3 kg (152 lb 12.5 oz)] 69.3 kg (152 lb 12.5 oz) (07/10 0450)  Intake/Output from previous day:  Intake/Output Summary (Last 24 hours) at 07/09/13 0831 Last data filed at 07/09/13 0750  Gross per 24 hour  Intake    480 ml  Output    800 ml  Net   -320 ml    Physical Exam: General appearance: alert, cooperative and no distress Lungs: clear to auscultation bilaterally Heart: regular rate and rhythm   Rate: 88  Rhythm: normal sinus rhythm and 6 beats NSVT  Lab Results:  Recent Labs  07/06/13 1805 07/09/13 0505  WBC 5.0 4.1  HGB 16.9 14.5  PLT 123* 123*    Recent Labs  07/08/13 0630 07/09/13 0505  NA 139 139  K 4.2 3.7  CL 103 103  CO2 25 22  GLUCOSE 109* 99  BUN 20 18  CREATININE 1.56* 1.27    Recent Labs  07/06/13 1806 07/07/13 0540  TROPONINI <0.30 <0.30   Hepatic Function Panel  Recent Labs  07/06/13 1227  PROT 7.7  ALBUMIN 3.4*  AST 44*  ALT 23  ALKPHOS 66  BILITOT 0.6   No results found for this basename: CHOL,  in the last 72 hours  Recent Labs  07/09/13 0505  INR 1.02    Imaging: Imaging results have been reviewed  Cardiac Studies:  Assessment/Plan:   Principal Problem:   Unstable angina Active Problems:   Acute systolic HF (heart failure)   Abnormal nuclear stress test, 07/07/13 large scar no ischemia   Cardiomyopathy, ischemic, EF 25-30%   Acute renal insufficiency- one SCr level if 1.57   NSVT (nonsustained ventricular tachycardia)   Frequent PVCs   HTN (hypertension)    PLAN: Cath today. He will need Life Vest at discharge. Increase Coreg 20 meq KCL this X 1.  ACE and diuretic on hold,  he had one SCr level of 1.57, down to his baseline 1.27 this am.   Luke Kilroy [name] Beeper [phone] 07/09/2013, 8:31 AM   I have seen and evaluated the patient this AM along with Luke Kilroy, PA. I agree with his findings, examination as well as impression recommendations.  Renal function has improved. Stable slow UOP in the absence of diuretic. ProBNP dramatically reduced, may only need low dose of standing diuretic.  Tolerating BB dose.  I agree with Dr. Hilty that with his presenting Sx of Chest Pressure, we need to investigate for multivessel disease with LHC, will also plan RHC to determine volume status & CO/CI.  Future Rx direction depends upon data from this procedure. Once we are sure that renal Fxn has stabilized, can initiate ACE-I/ARB therapy. Given his NSVT & EF 25-30% with likely Ischemic CM, would recommend LifeVest prior to d/c.   CARDIAC CATHETERIZATION CONSENT  Performing MD: [name],Tye W, M.D., M.S.   Procedure: LEFT & RIGHT HEART CATHETERIZATION WITH CORONARY ANGIOGRAPHY & POSSIBLE PERCUTANEOUS CORONARY ANGIOGRAPHY   The procedure with Risks/Benefits/Alternatives and Indications was reviewed with the patient. All questions were answered.  Risks / Complications include, but not limited to: Death, MI, CVA/TIA, VF/VT (with defibrillation), Bradycardia (need for temporary   pacer placement), contrast induced nephropathy, bleeding / bruising / hematoma / pseudoaneurysm, vascular or coronary injury (with possible emergent CT or Vascular Surgery), adverse medication reactions, infection.   The patient voices understanding and agree to proceed. Consent signed & on chart.   MD Time with pt: 10 min  [name],Kameren W, M.D., M.S. [facility] [address] [place], [place]  [address]  [phone] Pager # [phone] 07/09/2013 9:05 AM     

## 2013-07-09 NOTE — Progress Notes (Signed)
[facility] PROGRESS NOTE  Billy Parker JWJ:[id number] DOB: 09-Apr-1936 DOA: 07/06/2013 PCP: Erlinda Hong, MD  Assessment/Plan: #1 chest pain/ PVCs Patient presented with chest pain. Patient also with shortness of breath on minimal exertion. Chest pain improved. Cardiac enzymes negative x2. TSH within normal limits at 2.391. 2-D echo pending. - Myoview stress test shows large old infarct/scar in the inferior wall. No evidence of significant NG tube will ischemia. EF 29%  -Continue Coreg and aspirin -Appreciate cardiology assistance, cath planned today -diuretics and lisinopril on hold per cards prior to cath today.  #2 acute CHF exacerbation/elevated BNP Patient presented with shortness of breath on admission. Improved clinically following diuresis with -1.25 L  Over there the initial 24 hours.  -2-D echo with EF 25-30%, severe hypokinesis and scarring of the mid inferior myocardium noted, grade 2 diastolic dysfunction-see full report. -Cardiac cath this a.m., follow #3 hypokalemia Secondary to diuretics. Repleted.  #4 hypertension Stable. Continue Coreg, lisinopril on hold for now prior to cath.  #5 ARF. -Creatinine 1.56, agree with holding diuretics and lisinopril as  #6 prophylaxis Protonix for GI prophylaxis. Lovenox for DVT prophylaxis.   Code Status: Full Family Communication: Updated patient no family at bedside. Disposition Plan: Home when medically stable.   Consultants:  Cardiology: Dr. Allyson Sabal 07/06/2013  Procedures:  Myoview stress test 07/07/2013  Chest x-ray 07/06/2013  Antibiotics:  None  HPI/Subjective:  patient seen prior to cardiac cath today, he denies any complaints today.  Objective: Filed Vitals:   07/08/13 2046 07/09/13 0450 07/09/13 1309 07/09/13 1534  BP: 122/59 115/60  153/69  Pulse: 96 95 36 66  Temp: 98.1 F (36.7 C) 97.9 F (36.6 C)  97.9 F (36.6 C)  TempSrc: Oral Oral  Oral  Resp: 20 20  20   Weight:  69.3 kg (152 lb  12.5 oz)    SpO2: 98% 96%  98%    Intake/Output Summary (Last 24 hours) at 07/09/13 1549 Last data filed at 07/09/13 1539  Gross per 24 hour  Intake    240 ml  Output   1125 ml  Net   -885 ml   Filed Weights   07/07/13 0623 07/08/13 0608 07/09/13 0450  Weight: 67.132 kg (148 lb) 67.5 kg (148 lb 13 oz) 69.3 kg (152 lb 12.5 oz)    Exam:   General:  NAD  Cardiovascular: RRR  Respiratory: CTAB  Abdomen: Soft/NT/ND/+BS  EXTREMITIES: nO C/C/E  Data Reviewed: Basic Metabolic Panel:  Recent Labs Lab 07/06/13 1227 07/06/13 1805 07/07/13 0520 07/08/13 0630 07/09/13 0505  NA 139  --  138 139 139  K 3.6  --  3.2* 4.2 3.7  CL 103  --  101 103 103  CO2 22  --  25 25 22   GLUCOSE 106*  --  104* 109* 99  BUN 13  --  17 20 18   CREATININE 1.28 1.26 1.28 1.56* 1.27  CALCIUM 9.3  --  9.2 9.4 9.0  MG  --   --   --   --  1.9   Liver Function Tests:  Recent Labs Lab 07/06/13 1227  AST 44*  ALT 23  ALKPHOS 66  BILITOT 0.6  PROT 7.7  ALBUMIN 3.4*   No results found for this basename: LIPASE, AMYLASE,  in the last 168 hours No results found for this basename: AMMONIA,  in the last 168 hours CBC:  Recent Labs Lab 07/06/13 1227 07/06/13 1805 07/09/13 0505  WBC 5.0 5.0 4.1  NEUTROABS 2.6  --   --  HGB 15.4 16.9 14.5  HCT 43.7 48.5 43.4  MCV 85.5 86.5 88.2  PLT 156 123* 123*   Cardiac Enzymes:  Recent Labs Lab 07/06/13 1806 07/07/13 0540  TROPONINI <0.30 <0.30   BNP (last 3 results)  Recent Labs  07/06/13 1227 07/07/13 0540 07/09/13 0505  PROBNP 2887.0* 4037.0* 686.9*   CBG: No results found for this basename: GLUCAP,  in the last 168 hours  No results found for this or any previous visit (from the past 240 hour(s)).   Studies: No results found.  Scheduled Meds: . aspirin EC  81 mg Oral Daily  . carvedilol  6.25 mg Oral BID WC  . diazepam  2 mg Oral On Call  . enoxaparin (LOVENOX) injection  40 mg Subcutaneous Q24H  . pantoprazole  40 mg  Oral Q0600  . sodium chloride  3 mL Intravenous Q12H  . sodium chloride  3 mL Intravenous Q12H   Continuous Infusions: . sodium chloride    . sodium chloride 1 mL/kg/hr (07/09/13 1509)    Principal Problem:   Unstable angina Active Problems:   Frequent PVCs   HTN (hypertension)   Cardiomyopathy, ischemic, EF 25-30%   Acute systolic HF (heart failure)   Abnormal nuclear stress test, 07/07/13 large scar no ischemia   Acute renal insufficiency- one SCr level if 1.57   NSVT (nonsustained ventricular tachycardia)    Time spent: > 25 mins    Billy Parker C  [facility] Pager [phone]. If 7PM-7AM, please contact night-coverage at [email or web address], password [email or web address] 07/09/2013, 3:49 PM  LOS: 3 days

## 2013-07-09 NOTE — H&P (View-Only) (Signed)
Subjective:  No SOB or chest pain overnight  Objective:  Vital Signs in the last 24 hours: Temp:  [97.9 F (36.6 C)-98.1 F (36.7 C)] 97.9 F (36.6 C) (07/10 0450) Pulse Rate:  [65-96] 95 (07/10 0450) Resp:  [18-20] 20 (07/10 0450) BP: (110-122)/(55-60) 115/60 mmHg (07/10 0450) SpO2:  [96 %-98 %] 96 % (07/10 0450) Weight:  [69.3 kg (152 lb 12.5 oz)] 69.3 kg (152 lb 12.5 oz) (07/10 0450)  Intake/Output from previous day:  Intake/Output Summary (Last 24 hours) at 07/09/13 0831 Last data filed at 07/09/13 0750  Gross per 24 hour  Intake    480 ml  Output    800 ml  Net   -320 ml    Physical Exam: General appearance: alert, cooperative and no distress Lungs: clear to auscultation bilaterally Heart: regular rate and rhythm   Rate: 88  Rhythm: normal sinus rhythm and 6 beats NSVT  Lab Results:  Recent Labs  07/06/13 1805 07/09/13 0505  WBC 5.0 4.1  HGB 16.9 14.5  PLT 123* 123*    Recent Labs  07/08/13 0630 07/09/13 0505  NA 139 139  K 4.2 3.7  CL 103 103  CO2 25 22  GLUCOSE 109* 99  BUN 20 18  CREATININE 1.56* 1.27    Recent Labs  07/06/13 1806 07/07/13 0540  TROPONINI <0.30 <0.30   Hepatic Function Panel  Recent Labs  07/06/13 1227  PROT 7.7  ALBUMIN 3.4*  AST 44*  ALT 23  ALKPHOS 66  BILITOT 0.6   No results found for this basename: CHOL,  in the last 72 hours  Recent Labs  07/09/13 0505  INR 1.02    Imaging: Imaging results have been reviewed  Cardiac Studies:  Assessment/Plan:   Principal Problem:   Unstable angina Active Problems:   Acute systolic HF (heart failure)   Abnormal nuclear stress test, 07/07/13 large scar no ischemia   Cardiomyopathy, ischemic, EF 25-30%   Acute renal insufficiency- one SCr level if 1.57   NSVT (nonsustained ventricular tachycardia)   Frequent PVCs   HTN (hypertension)    PLAN: Cath today. He will need Life Vest at discharge. Increase Coreg 20 meq KCL this X 1.  ACE and diuretic on hold,  he had one SCr level of 1.57, down to his baseline 1.27 this am.   Corine Shelter [name] Beeper [phone] 07/09/2013, 8:31 AM   I have seen and evaluated the patient this AM along with Corine Shelter, PA. I agree with his findings, examination as well as impression recommendations.  Renal function has improved. Stable slow UOP in the absence of diuretic. ProBNP dramatically reduced, may only need low dose of standing diuretic.  Tolerating BB dose.  I agree with Dr. Rennis Golden that with his presenting Sx of Chest Pressure, we need to investigate for multivessel disease with LHC, will also plan RHC to determine volume status & CO/CI.  Future Rx direction depends upon data from this procedure. Once we are sure that renal Fxn has stabilized, can initiate ACE-I/ARB therapy. Given his NSVT & EF 25-30% with likely Ischemic CM, would recommend LifeVest prior to d/c.   CARDIAC CATHETERIZATION CONSENT  Performing MD: Marykay Lex, M.D., M.S.   Procedure: LEFT & RIGHT HEART CATHETERIZATION WITH CORONARY ANGIOGRAPHY & POSSIBLE PERCUTANEOUS CORONARY ANGIOGRAPHY   The procedure with Risks/Benefits/Alternatives and Indications was reviewed with the patient. All questions were answered.  Risks / Complications include, but not limited to: Death, MI, CVA/TIA, VF/VT (with defibrillation), Bradycardia (need for temporary  pacer placement), contrast induced nephropathy, bleeding / bruising / hematoma / pseudoaneurysm, vascular or coronary injury (with possible emergent CT or Vascular Surgery), adverse medication reactions, infection.   The patient voices understanding and agree to proceed. Consent signed & on chart.   MD Time with pt: 10 min  Chevelle Durr,Kentavious W, M.D., M.S. [facility] [address] [place], [place]  [address]  [phone] Pager # [phone] 07/09/2013 9:05 AM

## 2013-07-09 NOTE — Progress Notes (Signed)
Pt shown cath video 115, and shown stent diagram example. Pt stated he had no further questions at this time.

## 2013-07-10 DIAGNOSIS — F1410 Cocaine abuse, uncomplicated: Secondary | ICD-10-CM | POA: Diagnosis present

## 2013-07-10 DIAGNOSIS — I340 Nonrheumatic mitral (valve) insufficiency: Secondary | ICD-10-CM | POA: Diagnosis present

## 2013-07-10 DIAGNOSIS — I472 Ventricular tachycardia: Secondary | ICD-10-CM

## 2013-07-10 LAB — BASIC METABOLIC PANEL
BUN: 14 mg/dL (ref 6–23)
CO2: 23 mEq/L (ref 19–32)
Calcium: 8.8 mg/dL (ref 8.4–10.5)
GFR calc non Af Amer: 56 mL/min — ABNORMAL LOW (ref >90)
Glucose, Bld: 89 mg/dL (ref 70–99)
Sodium: 140 mEq/L (ref 135–145)

## 2013-07-10 MED ORDER — ISOSORBIDE MONONITRATE ER 30 MG PO TB24
30.0000 mg | ORAL_TABLET | Freq: Every day | ORAL | Status: DC
  Administered 2013-07-10 – 2013-07-12 (×3): 30 mg via ORAL
  Filled 2013-07-10 (×3): qty 1

## 2013-07-10 NOTE — Progress Notes (Signed)
Subjective:  No SOB  Objective:  Vital Signs in the last 24 hours: Temp:  [97.9 F (36.6 C)-98.1 F (36.7 C)] 98.1 F (36.7 C) (07/11 0556) Pulse Rate:  [36-88] 63 (07/11 0556) Resp:  [18-20] 18 (07/11 0556) BP: (109-153)/(48-81) 135/62 mmHg (07/11 0556) SpO2:  [95 %-98 %] 98 % (07/11 0556) Weight:  [145 lb 8 oz (65.998 kg)] 145 lb 8 oz (65.998 kg) (07/11 0556)  Intake/Output from previous day:  Intake/Output Summary (Last 24 hours) at 07/10/13 0910 Last data filed at 07/10/13 0840  Gross per 24 hour  Intake    800 ml  Output    750 ml  Net     50 ml    Physical Exam: General appearance: alert, cooperative and no distress Lungs: clear to auscultation bilaterally Heart: regular rate and rhythm- 2/6 MR murmur Rt groin without hematoma   Rate: 64  Rhythm: normal sinus rhythm  Lab Results:  Recent Labs  07/09/13 0505  WBC 4.1  HGB 14.5  PLT 123*    Recent Labs  07/08/13 0630 07/09/13 0505  NA 139 139  K 4.2 3.7  CL 103 103  CO2 25 22  GLUCOSE 109* 99  BUN 20 18  CREATININE 1.56* 1.27    Recent Labs  07/09/13 0505  INR 1.02    Imaging: Imaging results have been reviewed  Cardiac Studies:  Assessment/Plan:   Principal Problem:   Unstable angina  Active Problems:   Acute systolic HF (heart failure)   Abnormal nuclear stress test, 07/07/13 large scar no ischemia   Cardiomyopathy, ischemic, EF 25-30%   Acute renal insufficiency- one SCr level if 1.57   NSVT (nonsustained ventricular tachycardia)   Cocaine abuse - 07/02/13   Frequent PVCs   HTN (hypertension)    PLAN: No labs ordered for today. Check BMP in am, resume ACE if SCr stable. Life Vest in process (I checked with Morrie Sheldon today). Per Dr Herbie Baltimore, he will need standing diuretic dose at discharge (generic ACE/HCTZ combination would be the most affordable). Norvasc is no longer a low priced generic so will add low dose Nitrate for coronary spasm and afterload reduction.  Corine Shelter  [name] Beeper [phone] 07/10/2013, 9:10 AM

## 2013-07-10 NOTE — Progress Notes (Signed)
Pt. Seen and examined. Agree with the NP/[name] note as written.  Cath did not reveal significant stenosis yesterday, however echo and NST suggestive of inferior infarct. The NST demonstrates a dilated ventricle with more global HK, which supports a non-ischemic etiology. This may be related to cocaine use and he certainly could have had a vasospastic event that lead to acute MI. Nevertheless, he has heart failure. I agree with adding ACE/HCTZ combo as I doubt he needs a loop diuretic - he appears clinically well compensated.  Low dose nitrate will be helpful. Plan for d/c once LifeVest is fitted.  Chrystie Nose, MD, [facility] Attending Cardiologist [facility]

## 2013-07-10 NOTE — Progress Notes (Signed)
Received order post cath for "If PCI" however pt did not have a PCI. Please reorder Phase I Cardiac Rehab if CR services warranted. Thank you. Ethelda Chick CES, ACSM 7:44 AM 07/10/2013

## 2013-07-10 NOTE — Progress Notes (Signed)
Pt up ambulating in hall. No complaints of pain or sob

## 2013-07-10 NOTE — Progress Notes (Addendum)
[facility] PROGRESS NOTE  Billy Parker AVW:[id number] DOB: 09-28-1936 DOA: 07/06/2013 PCP: Erlinda Hong, MD  Assessment/Plan: #1 chest pain/ PVCs Patient presented with chest pain. Patient also with shortness of breath on minimal exertion. Chest pain improved. Cardiac enzymes negative x2. TSH within normal limits at 2.391. 2-D echo pending. - Myoview stress test shows large old infarct/scar in the inferior wall. No evidence of significant NG tube will ischemia. EF 29%  -Continue Coreg and aspirin -Appreciate cardiology assistance, cath 7/10 showed no evidence angiographically of his obstructive coronary disease -But per Dr [name]/cards, Based on his history of recent crack cocaine use, possible consideration >>prolonged spasm resulting in (recent) RCA infarction with spontaneous reperfusion after resolution of the spasm>>the elevated proBNP>>Troponin washout with reperfusion leading to negative Troponin levels on admission -diuretics and lisinopril remain on hold per cards since prior to cath, Cards planning to start pt on ACE/HCTZ on d/c. -pt awaiting life vest #2 acute CHF exacerbation/elevated BNP Patient presented with shortness of breath on admission. Improved clinically following diuresis with -1.25 L  Over there the initial 24 hours.  -2-D echo with EF 25-30%, severe hypokinesis and scarring of the mid inferior myocardium noted, grade 2 diastolic dysfunction-see full report. -cath as above -Cards planning to start pt on ACE/HCTZ on d/c.  #3 hypokalemia Secondary to diuretics. Repleted.  #4 hypertension Stable. Continue Coreg, lisinopril on hold for now as above  #5 ARF. -resolved with holding diuretics and lisinopril as above  #6 prophylaxis Protonix for GI prophylaxis. Lovenox for DVT prophylaxis.  #7cocaine abuse -sw consult for resources to quit  Code Status: Full Family Communication: Updated patient no family at bedside. Disposition Plan: Home when medically  stable.   Consultants:  Cardiology: Dr. Allyson Sabal 07/06/2013  Procedures:  Myoview stress test 07/07/2013  Chest x-ray 07/06/2013  Antibiotics:  None  HPI/Subjective:   he denies any complaints today.  Objective: Filed Vitals:   07/10/13 0556 07/10/13 1100 07/10/13 1300 07/10/13 1602  BP: 135/62 122/60 129/64   Pulse: 63 64 42 68  Temp: 98.1 F (36.7 C)  97.6 F (36.4 C)   TempSrc: Oral  Oral   Resp: 18  20   Height: 5\' 6"  (1.676 m)     Weight: 65.998 kg (145 lb 8 oz)     SpO2: 98%  99%     Intake/Output Summary (Last 24 hours) at 07/10/13 1904 Last data filed at 07/10/13 1826  Gross per 24 hour  Intake   1043 ml  Output    850 ml  Net    193 ml   Filed Weights   07/08/13 0608 07/09/13 0450 07/10/13 0556  Weight: 67.5 kg (148 lb 13 oz) 69.3 kg (152 lb 12.5 oz) 65.998 kg (145 lb 8 oz)    Exam:   General:  NAD  Cardiovascular: RRR  Respiratory: CTAB  Abdomen: Soft/NT/ND/+BS  EXTREMITIES: nO C/C/E  Data Reviewed: Basic Metabolic Panel:  Recent Labs Lab 07/06/13 1227 07/06/13 1805 07/07/13 0520 07/08/13 0630 07/09/13 0505 07/10/13 0615  NA 139  --  138 139 139 140  K 3.6  --  3.2* 4.2 3.7 3.8  CL 103  --  101 103 103 105  CO2 22  --  25 25 22 23   GLUCOSE 106*  --  104* 109* 99 89  BUN 13  --  17 20 18 14   CREATININE 1.28 1.26 1.28 1.56* 1.27 1.23  CALCIUM 9.3  --  9.2 9.4 9.0 8.8  MG  --   --   --   --  1.9  --    Liver Function Tests:  Recent Labs Lab 07/06/13 1227  AST 44*  ALT 23  ALKPHOS 66  BILITOT 0.6  PROT 7.7  ALBUMIN 3.4*   No results found for this basename: LIPASE, AMYLASE,  in the last 168 hours No results found for this basename: AMMONIA,  in the last 168 hours CBC:  Recent Labs Lab 07/06/13 1227 07/06/13 1805 07/09/13 0505  WBC 5.0 5.0 4.1  NEUTROABS 2.6  --   --   HGB 15.4 16.9 14.5  HCT 43.7 48.5 43.4  MCV 85.5 86.5 88.2  PLT 156 123* 123*   Cardiac Enzymes:  Recent Labs Lab 07/06/13 1806  07/07/13 0540  TROPONINI <0.30 <0.30   BNP (last 3 results)  Recent Labs  07/06/13 1227 07/07/13 0540 07/09/13 0505  PROBNP 2887.0* 4037.0* 686.9*   CBG: No results found for this basename: GLUCAP,  in the last 168 hours  No results found for this or any previous visit (from the past 240 hour(s)).   Studies: No results found.  Scheduled Meds: . aspirin EC  81 mg Oral Daily  . carvedilol  6.25 mg Oral BID WC  . enoxaparin (LOVENOX) injection  40 mg Subcutaneous Q24H  . isosorbide mononitrate  30 mg Oral Daily  . pantoprazole  40 mg Oral Q0600  . sodium chloride  3 mL Intravenous Q12H  . sodium chloride  3 mL Intravenous Q12H   Continuous Infusions:    Principal Problem:   Unstable angina Active Problems:   Frequent PVCs   HTN (hypertension)   Cardiomyopathy, ischemic, EF 25-30%   Acute systolic HF (heart failure)   Abnormal nuclear stress test, 07/07/13 large scar no ischemia   Acute renal insufficiency- one SCr level if 1.57   NSVT (nonsustained ventricular tachycardia)   Cocaine abuse   Mitral regurgitation- moderate    Time spent: > 25 mins    Billy Parker C  [facility] Pager [phone]. If 7PM-7AM, please contact night-coverage at [email or web address], password [facility] 07/10/2013, 7:04 PM  LOS: 4 days

## 2013-07-11 DIAGNOSIS — N289 Disorder of kidney and ureter, unspecified: Secondary | ICD-10-CM

## 2013-07-11 DIAGNOSIS — I428 Other cardiomyopathies: Secondary | ICD-10-CM

## 2013-07-11 LAB — URINE CULTURE: Colony Count: >=100000

## 2013-07-11 LAB — BASIC METABOLIC PANEL
BUN: 17 mg/dL (ref 6–23)
CO2: 29 mEq/L (ref 19–32)
Calcium: 8.7 mg/dL (ref 8.4–10.5)
Chloride: 105 mEq/L (ref 96–112)
Creatinine, Ser: 1.36 mg/dL — ABNORMAL HIGH (ref 0.50–1.35)
GFR calc Af Amer: 57 mL/min — ABNORMAL LOW (ref >90)
GFR calc non Af Amer: 49 mL/min — ABNORMAL LOW (ref >90)
Glucose, Bld: 98 mg/dL (ref 70–99)
Potassium: 4 mEq/L (ref 3.5–5.1)
Sodium: 141 mEq/L (ref 135–145)

## 2013-07-11 MED ORDER — LISINOPRIL 2.5 MG PO TABS
2.5000 mg | ORAL_TABLET | Freq: Every day | ORAL | Status: DC
  Administered 2013-07-11 – 2013-07-12 (×2): 2.5 mg via ORAL
  Filled 2013-07-11 (×2): qty 1

## 2013-07-11 NOTE — Progress Notes (Addendum)
Awaiting representative from the supplier of life vest.

## 2013-07-11 NOTE — Progress Notes (Signed)
Clinical Social Work Department BRIEF PSYCHOSOCIAL ASSESSMENT 07/11/2013  Patient:  Billy Parker, Billy Parker     Account Number:  [id number]     Admit date:  07/06/2013  Clinical Social Worker:  Dennison Bulla  Date/Time:  07/11/2013 08:30 AM  Referred by:  Physician  Date Referred:  07/11/2013 Referred for  Substance Abuse   Other Referral:   Interview type:  Patient Other interview type:    PSYCHOSOCIAL DATA Living Status:  OTHER Admitted from facility:   Level of care:   Primary support name:  Renea Ee Primary support relationship to patient:  SIBLING Degree of support available:   Adequate    CURRENT CONCERNS Current Concerns  Substance Abuse   Other Concerns:    SOCIAL WORK ASSESSMENT / PLAN CSW received referral to assess for substance use and to provide resources. CSW reviewed chart and met with patient at bedside. CSW introduced myself and explained role.    Patient reports he recently moved back from [facility] about 4 years ago. Patient reports he was involved with negative influences and felt he needed to move away. Patient reports he was using illegal drugs such as cocaine but went to meetings and moved away from friends in order to eliminate use altogether. Patient reports he cannot remember how long he has been sober but knows that using drugs can affect his health and has decided he wants to remain sober.    Patient is not currently attending any meetings or therapy but does not feel it is needed. Patient reports he rents a room at a boarding house and plans to return at [place]. CSW is signing off but available if further needs arise.   Assessment/plan status:  No Further Intervention Required Other assessment/ plan:   Information/referral to community resources:   Patient refused all resources    PATIENT'S/FAMILY'S RESPONSE TO PLAN OF CARE: Patient alert and oriented. Patient reports that he understands the dangers of substance use but is upset that MD did not tell him several  years ago that cocaine would affect his heart. Patient denies any referrals from CSW.       (weekend coverage)

## 2013-07-11 NOTE — Progress Notes (Signed)
[facility] PROGRESS NOTE  Dymond Spreen AVW:[id number] DOB: 06-Dec-1936 DOA: 07/06/2013 PCP: Erlinda Hong, MD  Assessment/Plan: #1 chest pain/ PVCs Patient presented with chest pain. Patient also with shortness of breath on minimal exertion. Chest pain improved. Cardiac enzymes negative x2. TSH within normal limits at 2.391. 2-D echo pending. - Myoview stress test shows large old infarct/scar in the inferior wall. No evidence of significant NG tube will ischemia. EF 29%  -Continue Coreg and aspirin -Appreciate cardiology assistance, cath 7/10 showed no evidence angiographically of his obstructive coronary disease -But per Dr [name]/cards, Based on his history of recent crack cocaine use, possible consideration >>prolonged spasm resulting in (recent) RCA infarction with spontaneous reperfusion after resolution of the spasm>>the elevated proBNP>>Troponin washout with reperfusion leading to negative Troponin levels on admission -Low dose lisinopril added per cards , to follow and and diuretic as appropriate -pt awaiting life vest #2 acute CHF exacerbation/elevated BNP Patient presented with shortness of breath on admission. Improved clinically following diuresis with -1.25 L  Over there the initial 24 hours.  -2-D echo with EF 25-30%, severe hypokinesis and scarring of the mid inferior myocardium noted, grade 2 diastolic dysfunction-see full report. -cath as above --Low dose lisinopril added per cards , cards to follow and and diuretic as appropriate  #3 hypokalemia Secondary to diuretics. Repleted.  #4 hypertension Stable. Continue Coreg, lisinopril resumed as above  #5 ARF. -resolved with holding diuretics and lisinopril as above, but beginning to trend up, follow on Low dose lisinopril   #6 prophylaxis Protonix for GI prophylaxis. Lovenox for DVT prophylaxis.  #7cocaine abuse -sw consult for resources to quit  Code Status: Full Family Communication: Updated patient no  family at bedside. Disposition Plan: Home when medically stable.   Consultants:  Cardiology: Dr. Allyson Sabal 07/06/2013  Procedures:  Myoview stress test 07/07/2013  Chest x-ray 07/06/2013  Antibiotics:  None  HPI/Subjective: Denies CP, no SOB  Objective: Filed Vitals:   07/10/13 2122 07/11/13 0546 07/11/13 1136 07/11/13 1344  BP: 126/81 137/59 140/60 125/56  Pulse: 66 65  44  Temp: 97.2 F (36.2 C) 97.9 F (36.6 C)  97.6 F (36.4 C)  TempSrc: Oral Oral  Oral  Resp: 20 20  20   Height:      Weight:  68.2 kg (150 lb 5.7 oz)    SpO2: 99% 98%  99%    Intake/Output Summary (Last 24 hours) at 07/11/13 1517 Last data filed at 07/11/13 1348  Gross per 24 hour  Intake   1083 ml  Output    875 ml  Net    208 ml   Filed Weights   07/09/13 0450 07/10/13 0556 07/11/13 0546  Weight: 69.3 kg (152 lb 12.5 oz) 65.998 kg (145 lb 8 oz) 68.2 kg (150 lb 5.7 oz)    Exam:   General:  NAD  Cardiovascular: RRR  Respiratory: CTAB  Abdomen: Soft/NT/ND/+BS  EXTREMITIES: nO C/C/E  Data Reviewed: Basic Metabolic Panel:  Recent Labs Lab 07/07/13 0520 07/08/13 0630 07/09/13 0505 07/10/13 0615 07/11/13 0500  NA 138 139 139 140 141  K 3.2* 4.2 3.7 3.8 4.0  CL 101 103 103 105 105  CO2 25 25 22 23 29   GLUCOSE 104* 109* 99 89 98  BUN 17 20 18 14 17   CREATININE 1.28 1.56* 1.27 1.23 1.36*  CALCIUM 9.2 9.4 9.0 8.8 8.7  MG  --   --  1.9  --   --    Liver Function Tests:  Recent Labs Lab 07/06/13 1227  AST 44*  ALT 23  ALKPHOS 66  BILITOT 0.6  PROT 7.7  ALBUMIN 3.4*   No results found for this basename: LIPASE, AMYLASE,  in the last 168 hours No results found for this basename: AMMONIA,  in the last 168 hours CBC:  Recent Labs Lab 07/06/13 1227 07/06/13 1805 07/09/13 0505  WBC 5.0 5.0 4.1  NEUTROABS 2.6  --   --   HGB 15.4 16.9 14.5  HCT 43.7 48.5 43.4  MCV 85.5 86.5 88.2  PLT 156 123* 123*   Cardiac Enzymes:  Recent Labs Lab 07/06/13 1806  07/07/13 0540  TROPONINI <0.30 <0.30   BNP (last 3 results)  Recent Labs  07/06/13 1227 07/07/13 0540 07/09/13 0505  PROBNP 2887.0* 4037.0* 686.9*   CBG: No results found for this basename: GLUCAP,  in the last 168 hours  Recent Results (from the past 240 hour(s))  URINE CULTURE     Status: None   Collection Time    07/09/13  4:12 PM      Result Value Range Status   Specimen Description URINE, RANDOM   Final   Special Requests NONE   Final   Culture  Setup Time 07/09/2013 17:24   Final   Colony Count >=100,000 COLONIES/ML   Final   Culture ESCHERICHIA COLI   Final   Report Status PENDING   Incomplete     Studies: No results found.  Scheduled Meds: . aspirin EC  81 mg Oral Daily  . carvedilol  6.25 mg Oral BID WC  . enoxaparin (LOVENOX) injection  40 mg Subcutaneous Q24H  . isosorbide mononitrate  30 mg Oral Daily  . lisinopril  2.5 mg Oral Daily  . pantoprazole  40 mg Oral Q0600  . sodium chloride  3 mL Intravenous Q12H  . sodium chloride  3 mL Intravenous Q12H   Continuous Infusions:    Principal Problem:   Unstable angina Active Problems:   Frequent PVCs   HTN (hypertension)   Cardiomyopathy, ischemic, EF 25-30%   Acute systolic HF (heart failure)   Abnormal nuclear stress test, 07/07/13 large scar no ischemia   Acute renal insufficiency- one SCr level if 1.57   NSVT (nonsustained ventricular tachycardia)   Cocaine abuse   Mitral regurgitation- moderate    Time spent: > 25 mins    Hilery Wintle C  [facility] Pager [phone]. If 7PM-7AM, please contact night-coverage at [email or web address], password [email or web address] 07/11/2013, 3:17 PM  LOS: 5 days

## 2013-07-11 NOTE — Progress Notes (Signed)
[facility] Progress Note  Subjective:  No chest pain or SOB  Objective:   Vital Signs in the last 24 hours: Temp:  [97.2 F (36.2 C)-97.9 F (36.6 C)] 97.9 F (36.6 C) (07/12 0546) Pulse Rate:  [42-68] 65 (07/12 0546) Resp:  [20] 20 (07/12 0546) BP: (122-137)/(59-81) 137/59 mmHg (07/12 0546) SpO2:  [98 %-99 %] 98 % (07/12 0546) Weight:  [150 lb 5.7 oz (68.2 kg)] 150 lb 5.7 oz (68.2 kg) (07/12 0546)  Intake/Output from previous day: 07/11 0701 - 07/12 0700 In: 1163 [P.O.:1160; I.V.:3] Out: 775 [Urine:775]  Scheduled: . aspirin EC  81 mg Oral Daily  . carvedilol  6.25 mg Oral BID WC  . enoxaparin (LOVENOX) injection  40 mg Subcutaneous Q24H  . isosorbide mononitrate  30 mg Oral Daily  . pantoprazole  40 mg Oral Q0600  . sodium chloride  3 mL Intravenous Q12H  . sodium chloride  3 mL Intravenous Q12H     Physical Exam:   General appearance: alert, cooperative and no distress Neck: no adenopathy, no JVD and supple, symmetrical, trachea midline Lungs: clear to auscultation bilaterally Heart: regular rate and rhythm 1/6 sem Abdomen: soft, non-tender; bowel sounds normal; no masses,  no organomegaly Extremities: no edema, redness or tenderness in the calves or thighs Skin: Skin color, texture, turgor normal. No rashes or lesions   Rate: 60's  Rhythm: sinus with PAC's  Lab Results:    Recent Labs  07/10/13 0615 07/11/13 0500  NA 140 141  K 3.8 4.0  CL 105 105  CO2 23 29  GLUCOSE 89 98  BUN 14 17  CREATININE 1.23 1.36*   No results found for this basename: TROPONINI, CK, MB,  in the last 72 hours Hepatic Function Panel No results found for this basename: PROT, ALBUMIN, AST, ALT, ALKPHOS, BILITOT, BILIDIR, IBILI,  in the last 72 hours  Recent Labs  07/09/13 0505  INR 1.02   BNP (last 3 results)  Recent Labs  07/06/13 1227 07/07/13 0540 07/09/13 0505  PROBNP 2887.0* 4037.0* 686.9*   Lipid Panel  Lipid Panel  No  results found for this basename: chol, trig, hdl, cholhdl, vldl, ldlcalc      Imaging:  No results found.    Assessment/Plan:   Principal Problem:   Unstable angina Active Problems:   Frequent PVCs   HTN (hypertension)   Cardiomyopathy, ischemic, EF 25-30%   Acute systolic HF (heart failure)   Abnormal nuclear stress test, 07/07/13 large scar no ischemia   Acute renal insufficiency- one SCr level if 1.57   NSVT (nonsustained ventricular tachycardia)   Cocaine abuse   Mitral regurgitation- moderate   Will begin low dose ACE-I today with lisinopril 2.5 mg and titrate as BP and renal fxn allow. Cr 1.36 today. Will not add diuretic yet. For life-vest measuring hopefuuly today. F/U renal fxn in am.  Pt states that his last cocain use was ~ 1 month ago and not immediately prior to his admission.   Lennette Bihari, MD, [facility] 07/11/2013, 9:45 AM

## 2013-07-12 ENCOUNTER — Encounter (HOSPITAL_COMMUNITY): Payer: Self-pay | Admitting: Cardiology

## 2013-07-12 DIAGNOSIS — I2510 Atherosclerotic heart disease of native coronary artery without angina pectoris: Secondary | ICD-10-CM

## 2013-07-12 DIAGNOSIS — Z789 Other specified health status: Secondary | ICD-10-CM

## 2013-07-12 DIAGNOSIS — Z9189 Other specified personal risk factors, not elsewhere classified: Secondary | ICD-10-CM

## 2013-07-12 HISTORY — DX: Other specified personal risk factors, not elsewhere classified: Z91.89

## 2013-07-12 LAB — BASIC METABOLIC PANEL
Calcium: 9.4 mg/dL (ref 8.4–10.5)
GFR calc Af Amer: 63 mL/min — ABNORMAL LOW (ref >90)
GFR calc non Af Amer: 54 mL/min — ABNORMAL LOW (ref >90)
Potassium: 4.1 mEq/L (ref 3.5–5.1)
Sodium: 139 mEq/L (ref 135–145)

## 2013-07-12 LAB — PRO B NATRIURETIC PEPTIDE: Pro B Natriuretic peptide (BNP): 1047 pg/mL — ABNORMAL HIGH (ref 0–450)

## 2013-07-12 MED ORDER — CARVEDILOL 6.25 MG PO TABS: 6.2500 mg | ORAL_TABLET | Freq: Two times a day (BID) | ORAL | Status: DC

## 2013-07-12 MED ORDER — LISINOPRIL 5 MG PO TABS: 5.0000 mg | ORAL_TABLET | Freq: Every day | ORAL | Status: DC

## 2013-07-12 MED ORDER — CEFUROXIME AXETIL 250 MG PO TABS: 250.0000 mg | ORAL_TABLET | Freq: Two times a day (BID) | ORAL | Status: DC

## 2013-07-12 MED ORDER — HYDROCHLOROTHIAZIDE 12.5 MG PO CAPS: 12.5000 mg | ORAL_CAPSULE | Freq: Every day | ORAL | Status: DC

## 2013-07-12 MED ORDER — ISOSORBIDE MONONITRATE ER 30 MG PO TB24: 30.0000 mg | ORAL_TABLET | Freq: Every day | ORAL | Status: DC

## 2013-07-12 MED ORDER — CEFTRIAXONE SODIUM 1 G IJ SOLR
1.0000 g | Freq: Once | INTRAMUSCULAR | Status: DC
  Filled 2013-07-12: qty 10

## 2013-07-12 NOTE — Progress Notes (Signed)
Client verbalized understanding of discharge orders.  IV site discontinued.  Client wearing Life Vest upon release.
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
## 2013-07-12 NOTE — Progress Notes (Signed)
Subjective: No complaints, no chest pain, no SOB  Objective: Vital signs in last 24 hours: Temp:  [97.6 F (36.4 C)-98.7 F (37.1 C)] 98.7 F (37.1 C) (07/13 0455) Pulse Rate:  [42-65] 56 (07/13 0455) Resp:  [18-20] 18 (07/13 0455) BP: (122-158)/(54-61) 158/61 mmHg (07/13 0455) SpO2:  [97 %-99 %] 97 % (07/13 0455) Weight:  [152 lb 3.2 oz (69.037 kg)] 152 lb 3.2 oz (69.037 kg) (07/13 0455) Weight change: 1 lb 13.5 oz (0.837 kg) Last BM Date: 07/10/13 Intake/Output from previous day: +98 (-1784 since last admit)  Wt up from 150.5 yesterday to 152.3 today admit wt 151.6 07/12 0701 - 07/13 0700 In: 1023 [P.O.:1020; I.V.:3] Out: 925 [Urine:925] Intake/Output this shift: Total I/O In: 360 [P.O.:360] Out: -   PE:  General:Pleasant affect, NAD Skin:Warm and dry, brisk capillary refill Heart:S1S2 RRR without murmur, gallup, rub or click Lungs:clear without rales, rhonchi, or wheezes ZOX:WRUE, non tender, + BS, do not palpate liver spleen or masses Ext:no lower ext edema Neuro:alert and oriented, MAE, follows commands, + facial symmetry   Tele: S Brady to 57s with PVCs  Lab Results:  BNP (last 3 results)  Recent Labs  07/07/13 0540 07/09/13 0505 07/12/13 0500  PROBNP 4037.0* 686.9* 1047.0*    No results found for this basename: WBC, HGB, HCT, PLT,  in the last 72 hours BMET  Recent Labs  07/11/13 0500 07/12/13 0450  NA 141 139  K 4.0 4.1  CL 105 103  CO2 29 30  GLUCOSE 98 98  BUN 17 16  CREATININE 1.36* 1.26  CALCIUM 8.7 9.4   No results found for this basename: TROPONINI, CK, MB,  in the last 72 hours  No results found for this basename: CHOL,  HDL,  LDLCALC,  LDLDIRECT,  TRIG,  CHOLHDL   No results found for this basename: HGBA1C     Lab Results  Component Value Date   TSH 2.391 07/06/2013      Studies/Results: No results found.  Medications: I have reviewed the patient's current medications. Scheduled Meds: . aspirin EC  81 mg Oral  Daily  . carvedilol  6.25 mg Oral BID WC  . enoxaparin (LOVENOX) injection  40 mg Subcutaneous Q24H  . isosorbide mononitrate  30 mg Oral Daily  . lisinopril  2.5 mg Oral Daily  . pantoprazole  40 mg Oral Q0600  . sodium chloride  3 mL Intravenous Q12H  . sodium chloride  3 mL Intravenous Q12H   Continuous Infusions:  PRN Meds:.sodium chloride, sodium chloride, acetaminophen, HYDROcodone-acetaminophen, ondansetron (ZOFRAN) IV, sodium chloride, sodium chloride  Assessment/Plan: Principal Problem:   Unstable angina Active Problems:   Frequent PVCs   HTN (hypertension)   Cardiomyopathy, ischemic, EF 25-30%   Acute systolic HF (heart failure)   Abnormal nuclear stress test, 07/07/13 large scar no ischemia   Acute renal insufficiency- one SCr level if 1.57   NSVT (nonsustained ventricular tachycardia)   Cocaine abuse   Mitral regurgitation- moderate   At risk for sudden cardiac death, with decreased EF, to wear life vest.   CAD (coronary artery disease), non obstructive  PLAN: Life vest  Pro bnp with increase.  Pt ambulating with complaints, ? Discharge  Lifevest in room and he has been instructed.  LOS: 6 days   Time spent with pt. :15 minutes. [facility] R  Nurse Practitioner Certified Pager [phone] 07/12/2013, 9:42 AM   Patient seen and examined. Agree with assessment and plan. Tolerating initiation of lisinopril  at 2.5 mg. Breathing well. Life-vest received. Will increase lisinopril to 5 mg. Plan DC and OV f/u in 1 week.   Lennette Bihari, MD, [facility] 07/12/2013 11:19 AM

## 2013-07-14 ENCOUNTER — Telehealth: Payer: Self-pay | Admitting: Physician Assistant

## 2013-07-14 NOTE — Telephone Encounter (Signed)
TCM telephone call  Patient reports that he is doing well and he is wearing his life vest. He had no questions regarding his medications and we confirm his appointment for next Monday on July 21.  Deshanta Lady 5:35 PM

## 2013-07-20 ENCOUNTER — Ambulatory Visit (INDEPENDENT_AMBULATORY_CARE_PROVIDER_SITE_OTHER): Payer: Medicare Other | Admitting: Cardiology

## 2013-07-20 ENCOUNTER — Encounter: Payer: Self-pay | Admitting: Cardiology

## 2013-07-20 VITALS — BP 142/90 | HR 76 | Ht 66.0 in | Wt 154.5 lb

## 2013-07-20 DIAGNOSIS — N289 Disorder of kidney and ureter, unspecified: Secondary | ICD-10-CM

## 2013-07-20 DIAGNOSIS — Z9189 Other specified personal risk factors, not elsewhere classified: Secondary | ICD-10-CM

## 2013-07-20 DIAGNOSIS — I255 Ischemic cardiomyopathy: Secondary | ICD-10-CM

## 2013-07-20 DIAGNOSIS — I2589 Other forms of chronic ischemic heart disease: Secondary | ICD-10-CM

## 2013-07-20 DIAGNOSIS — Z789 Other specified health status: Secondary | ICD-10-CM

## 2013-07-20 DIAGNOSIS — I2510 Atherosclerotic heart disease of native coronary artery without angina pectoris: Secondary | ICD-10-CM

## 2013-07-20 DIAGNOSIS — I493 Ventricular premature depolarization: Secondary | ICD-10-CM

## 2013-07-20 DIAGNOSIS — I4949 Other premature depolarization: Secondary | ICD-10-CM

## 2013-07-20 MED ORDER — HYDROCHLOROTHIAZIDE 12.5 MG PO CAPS: 25.0000 mg | ORAL_CAPSULE | Freq: Every day | ORAL | Status: DC

## 2013-07-20 NOTE — Progress Notes (Signed)
07/20/2013 Carmelina Dane   10-09-1936  [id number]  Primary Physicia Erlinda Hong, MD Primary Cardiologist: Dr Herbie Baltimore  HPI:  77 y/o admitted 07/06/13 with acute CHF. There was a history of cocaine use on 07/02/13. Myoview suggested scar, cath revealed no significant obstructive CAD. He was discharge with a Technical sales engineer on medical Rx. He is seen in the office today for follow up. Since discharge he has  Noted some persistent dyspnea but no orthopnea or PND. He continues to have frequent PVCs.    Current Outpatient Prescriptions  Medication Sig Dispense Refill  . carvedilol (COREG) 6.25 MG tablet Take 1 tablet (6.25 mg total) by mouth 2 (two) times daily with a meal.  60 tablet  0  . cefUROXime (CEFTIN) 250 MG tablet Take 1 tablet (250 mg total) by mouth 2 (two) times daily.  12 tablet  0  . hydrochlorothiazide (MICROZIDE) 12.5 MG capsule Take 1 capsule (12.5 mg total) by mouth daily.  30 capsule  0  . HYDROcodone-acetaminophen (NORCO) 7.5-325 MG per tablet Take 1 tablet by mouth every 6 (six) hours as needed for pain.      . isosorbide mononitrate (IMDUR) 30 MG 24 hr tablet Take 1 tablet (30 mg total) by mouth daily.  30 tablet  0  . lisinopril (PRINIVIL,ZESTRIL) 5 MG tablet Take 1 tablet (5 mg total) by mouth daily.  30 tablet  0   No current facility-administered medications for this visit.    No Known Allergies  History   Social History  . Marital Status: Divorced    Spouse Name: N/A    Number of Children: N/A  . Years of Education: N/A   Occupational History  . Not on file.   Social History Main Topics  . Smoking status: Never Smoker   . Smokeless tobacco: Never Used  . Alcohol Use: 6.0 oz/week    10 Cans of beer per week     Comment: 07/06/2013 "I drink 2, 40oz beers at least 3X/wk"  . Drug Use: Yes    Special: "Crack" cocaine     Comment: 07/06/2013 "last used crack 07/02/2013"  . Sexually Active: Not Currently   Other Topics Concern  . Not on file   Social History Narrative   . No narrative on file     Review of Systems: General: negative for chills, fever, night sweats or weight changes.  Cardiovascular: negative for chest pain, dyspnea on exertion, edema, orthopnea, palpitations, paroxysmal nocturnal dyspnea or shortness of breath Dermatological: negative for rash Respiratory: negative for cough or wheezing Urologic: negative for hematuria Abdominal: negative for nausea, vomiting, diarrhea, bright red blood per rectum, melena, or hematemesis Neurologic: negative for visual changes, syncope, or dizziness All other systems reviewed and are otherwise negative except as noted above.    Blood pressure 142/90, pulse 76, height 5\' 6"  (1.676 m), weight 154 lb 8 oz (70.081 kg).  General appearance: alert, cooperative and no distress Neck: no JVD Lungs: clear to auscultation bilaterally Heart: irregularly irregular rhythm Extremities: no edema  EKG  EKG: normal sinus rhythm, frequent PVC's noted.  ASSESSMENT AND PLAN:   Cardiomyopathy, non ischemic, EF 25-30% .  Acute renal insufficiency- one SCr level if 1.57 .  CAD (coronary artery disease), non obstructive .  At risk for sudden cardiac death, with decreased EF, to wear life vest. .  Frequent PVCs .   PLAN  His wgt is up to 154 from his discharge wgt of 145. Increase HCTZ to 25mg  daily. Check BMP, follow  Up in one week.    Agatha Duplechain KPA-C 07/20/2013 2:42 PM

## 2013-07-20 NOTE — Patient Instructions (Signed)
Increase HCTZ to 25mg  daily. No salt. Follow up next week.  Corine Shelter [name] 07/20/2013 2:50 PM

## 2013-07-21 ENCOUNTER — Encounter: Payer: Self-pay | Admitting: Cardiology

## 2013-07-29 ENCOUNTER — Ambulatory Visit: Payer: Medicare Other | Admitting: Cardiology

## 2013-08-16 ENCOUNTER — Emergency Department (HOSPITAL_COMMUNITY): Payer: Medicare Other

## 2013-08-16 ENCOUNTER — Encounter (HOSPITAL_COMMUNITY): Payer: Self-pay

## 2013-08-16 ENCOUNTER — Emergency Department (HOSPITAL_COMMUNITY)
Admission: EM | Admit: 2013-08-16 | Discharge: 2013-08-16 | Disposition: A | Discharge status: Home / Self Care | Payer: Medicare Other | Attending: Emergency Medicine | Admitting: Emergency Medicine

## 2013-08-16 DIAGNOSIS — Z789 Other specified health status: Secondary | ICD-10-CM | POA: Insufficient documentation

## 2013-08-16 DIAGNOSIS — I2510 Atherosclerotic heart disease of native coronary artery without angina pectoris: Secondary | ICD-10-CM | POA: Insufficient documentation

## 2013-08-16 DIAGNOSIS — N390 Urinary tract infection, site not specified: Secondary | ICD-10-CM | POA: Insufficient documentation

## 2013-08-16 DIAGNOSIS — R42 Dizziness and giddiness: Secondary | ICD-10-CM

## 2013-08-16 DIAGNOSIS — Z8709 Personal history of other diseases of the respiratory system: Secondary | ICD-10-CM | POA: Insufficient documentation

## 2013-08-16 DIAGNOSIS — Z79899 Other long term (current) drug therapy: Secondary | ICD-10-CM | POA: Insufficient documentation

## 2013-08-16 DIAGNOSIS — I10 Essential (primary) hypertension: Secondary | ICD-10-CM | POA: Insufficient documentation

## 2013-08-16 DIAGNOSIS — N289 Disorder of kidney and ureter, unspecified: Secondary | ICD-10-CM | POA: Insufficient documentation

## 2013-08-16 DIAGNOSIS — M129 Arthropathy, unspecified: Secondary | ICD-10-CM | POA: Insufficient documentation

## 2013-08-16 DIAGNOSIS — G8929 Other chronic pain: Secondary | ICD-10-CM | POA: Insufficient documentation

## 2013-08-16 DIAGNOSIS — M545 Low back pain, unspecified: Secondary | ICD-10-CM | POA: Insufficient documentation

## 2013-08-16 DIAGNOSIS — F1410 Cocaine abuse, uncomplicated: Secondary | ICD-10-CM | POA: Insufficient documentation

## 2013-08-16 DIAGNOSIS — Z8679 Personal history of other diseases of the circulatory system: Secondary | ICD-10-CM | POA: Insufficient documentation

## 2013-08-16 DIAGNOSIS — R079 Chest pain, unspecified: Secondary | ICD-10-CM | POA: Insufficient documentation

## 2013-08-16 DIAGNOSIS — I5021 Acute systolic (congestive) heart failure: Secondary | ICD-10-CM | POA: Insufficient documentation

## 2013-08-16 LAB — COMPREHENSIVE METABOLIC PANEL
ALT: 18 U/L (ref 0–53)
CO2: 24 mEq/L (ref 19–32)
Calcium: 8.6 mg/dL (ref 8.4–10.5)
Creatinine, Ser: 1.48 mg/dL — ABNORMAL HIGH (ref 0.50–1.35)
GFR calc Af Amer: 52 mL/min — ABNORMAL LOW (ref >90)
GFR calc non Af Amer: 45 mL/min — ABNORMAL LOW (ref >90)
Glucose, Bld: 143 mg/dL — ABNORMAL HIGH (ref 70–99)
Sodium: 135 mEq/L (ref 135–145)

## 2013-08-16 LAB — URINE MICROSCOPIC-ADD ON

## 2013-08-16 LAB — RAPID URINE DRUG SCREEN, HOSP PERFORMED
Barbiturates: NOT DETECTED
Cocaine: POSITIVE — AB

## 2013-08-16 LAB — URINALYSIS, ROUTINE W REFLEX MICROSCOPIC
Bilirubin Urine: NEGATIVE
Nitrite: POSITIVE — AB
Specific Gravity, Urine: 1.018 (ref 1.005–1.030)
Urobilinogen, UA: 1 mg/dL (ref 0.0–1.0)

## 2013-08-16 LAB — CBC
HCT: 39 % (ref 39.0–52.0)
Hemoglobin: 13.9 g/dL (ref 13.0–17.0)
MCH: 30.5 pg (ref 26.0–34.0)
RBC: 4.56 MIL/uL (ref 4.22–5.81)

## 2013-08-16 LAB — TROPONIN I: Troponin I: <0.3 ng/mL (ref <0.30)

## 2013-08-16 MED ORDER — AZITHROMYCIN 250 MG PO TABS
1000.0000 mg | ORAL_TABLET | Freq: Once | ORAL | Status: AC
  Administered 2013-08-16: 1000 mg via ORAL
  Filled 2013-08-16: qty 4

## 2013-08-16 MED ORDER — SODIUM CHLORIDE 0.9 % IV BOLUS (SEPSIS)
1000.0000 mL | Freq: Once | INTRAVENOUS | Status: AC
  Administered 2013-08-16: 1000 mL via INTRAVENOUS

## 2013-08-16 MED ORDER — DEXTROSE 5 % IV SOLN
1.0000 g | INTRAVENOUS | Status: DC
  Administered 2013-08-16: 1 g via INTRAVENOUS
  Filled 2013-08-16: qty 10

## 2013-08-16 MED ORDER — CEPHALEXIN 500 MG PO CAPS: 500.0000 mg | ORAL_CAPSULE | Freq: Four times a day (QID) | ORAL | Status: DC

## 2013-08-16 NOTE — ED Notes (Signed)
Pt knows that urine is needed. Pt unable to void at this time. Urinal is at bed side

## 2013-08-16 NOTE — ED Notes (Signed)
Pt denies any current drug/alcohol use.  Pt offered fluids, but does not want any at this time.

## 2013-08-16 NOTE — ED Notes (Signed)
Pt does not want any fluids at this time. Pt knows that urine is needed pt does not have to void at this time. Urinal is at bed side

## 2013-08-16 NOTE — ED Notes (Signed)
Pt returned from radiology.
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
## 2013-08-16 NOTE — ED Notes (Signed)
Pt from home where he lives alone.  Pt reports recent Cath with stent placement.  EMS called out for c/o CP with dizziness.  Per EMS, pt alternates NSR with bigeminy PVC's.  EMS reports the PVC's are non perfusing.  Pt c/o dizziness even when lying on stretcher.  Pt given 4mg  Zofran by EMS en route.

## 2013-08-19 LAB — URINE CULTURE: Colony Count: >=100000

## 2013-08-20 NOTE — ED Notes (Signed)
+   Urine Culture- treated with appropriate medication per protocol MD. 

## 2013-08-24 ENCOUNTER — Ambulatory Visit: Payer: Medicare Other | Attending: Internal Medicine | Admitting: Physical Therapy

## 2013-11-23 ENCOUNTER — Ambulatory Visit (INDEPENDENT_AMBULATORY_CARE_PROVIDER_SITE_OTHER): Payer: Medicare Other | Admitting: Surgery

## 2013-12-14 ENCOUNTER — Ambulatory Visit (INDEPENDENT_AMBULATORY_CARE_PROVIDER_SITE_OTHER): Payer: Medicare Other | Admitting: Surgery

## 2014-01-03 ENCOUNTER — Inpatient Hospital Stay (HOSPITAL_COMMUNITY)
Admission: EM | Admit: 2014-01-03 | Discharge: 2014-01-06 | DRG: 292 | Disposition: A | Discharge status: Home / Self Care | Payer: Medicare HMO | Attending: Internal Medicine | Admitting: Internal Medicine

## 2014-01-03 ENCOUNTER — Emergency Department (HOSPITAL_COMMUNITY): Payer: Medicare HMO

## 2014-01-03 ENCOUNTER — Encounter (HOSPITAL_COMMUNITY): Payer: Self-pay | Admitting: Emergency Medicine

## 2014-01-03 DIAGNOSIS — N289 Disorder of kidney and ureter, unspecified: Secondary | ICD-10-CM

## 2014-01-03 DIAGNOSIS — T502X5A Adverse effect of carbonic-anhydrase inhibitors, benzothiadiazides and other diuretics, initial encounter: Secondary | ICD-10-CM | POA: Diagnosis present

## 2014-01-03 DIAGNOSIS — F1410 Cocaine abuse, uncomplicated: Secondary | ICD-10-CM

## 2014-01-03 DIAGNOSIS — I472 Ventricular tachycardia: Secondary | ICD-10-CM

## 2014-01-03 DIAGNOSIS — E876 Hypokalemia: Secondary | ICD-10-CM

## 2014-01-03 DIAGNOSIS — F1420 Cocaine dependence, uncomplicated: Secondary | ICD-10-CM | POA: Diagnosis present

## 2014-01-03 DIAGNOSIS — R7989 Other specified abnormal findings of blood chemistry: Secondary | ICD-10-CM

## 2014-01-03 DIAGNOSIS — Z79899 Other long term (current) drug therapy: Secondary | ICD-10-CM

## 2014-01-03 DIAGNOSIS — I059 Rheumatic mitral valve disease, unspecified: Secondary | ICD-10-CM | POA: Diagnosis present

## 2014-01-03 DIAGNOSIS — Z91199 Patient's noncompliance with other medical treatment and regimen due to unspecified reason: Secondary | ICD-10-CM

## 2014-01-03 DIAGNOSIS — I5021 Acute systolic (congestive) heart failure: Secondary | ICD-10-CM

## 2014-01-03 DIAGNOSIS — I4729 Other ventricular tachycardia: Secondary | ICD-10-CM

## 2014-01-03 DIAGNOSIS — I5043 Acute on chronic combined systolic (congestive) and diastolic (congestive) heart failure: Secondary | ICD-10-CM | POA: Diagnosis present

## 2014-01-03 DIAGNOSIS — I255 Ischemic cardiomyopathy: Secondary | ICD-10-CM

## 2014-01-03 DIAGNOSIS — I340 Nonrheumatic mitral (valve) insufficiency: Secondary | ICD-10-CM

## 2014-01-03 DIAGNOSIS — N179 Acute kidney failure, unspecified: Secondary | ICD-10-CM | POA: Diagnosis present

## 2014-01-03 DIAGNOSIS — I2589 Other forms of chronic ischemic heart disease: Secondary | ICD-10-CM | POA: Diagnosis present

## 2014-01-03 DIAGNOSIS — I5023 Acute on chronic systolic (congestive) heart failure: Principal | ICD-10-CM | POA: Diagnosis present

## 2014-01-03 DIAGNOSIS — I493 Ventricular premature depolarization: Secondary | ICD-10-CM

## 2014-01-03 DIAGNOSIS — I509 Heart failure, unspecified: Secondary | ICD-10-CM

## 2014-01-03 DIAGNOSIS — R9439 Abnormal result of other cardiovascular function study: Secondary | ICD-10-CM

## 2014-01-03 DIAGNOSIS — Z9119 Patient's noncompliance with other medical treatment and regimen: Secondary | ICD-10-CM

## 2014-01-03 DIAGNOSIS — I200 Unstable angina: Secondary | ICD-10-CM

## 2014-01-03 DIAGNOSIS — I4949 Other premature depolarization: Secondary | ICD-10-CM | POA: Diagnosis present

## 2014-01-03 DIAGNOSIS — Z9189 Other specified personal risk factors, not elsewhere classified: Secondary | ICD-10-CM

## 2014-01-03 DIAGNOSIS — Z8249 Family history of ischemic heart disease and other diseases of the circulatory system: Secondary | ICD-10-CM

## 2014-01-03 DIAGNOSIS — I10 Essential (primary) hypertension: Secondary | ICD-10-CM | POA: Diagnosis present

## 2014-01-03 DIAGNOSIS — I2510 Atherosclerotic heart disease of native coronary artery without angina pectoris: Secondary | ICD-10-CM

## 2014-01-03 LAB — CBC
HEMATOCRIT: 45.4 % (ref 39.0–52.0)
HEMATOCRIT: 42.6 % (ref 39.0–52.0)
Hemoglobin: 15.5 g/dL (ref 13.0–17.0)
Hemoglobin: 14.6 g/dL (ref 13.0–17.0)
MCH: 30 pg (ref 26.0–34.0)
MCH: 30 pg (ref 26.0–34.0)
MCHC: 34.1 g/dL (ref 30.0–36.0)
MCHC: 34.3 g/dL (ref 30.0–36.0)
MCV: 88 fL (ref 78.0–100.0)
MCV: 87.5 fL (ref 78.0–100.0)
PLATELETS: 147 10*3/uL — AB (ref 150–400)
Platelets: 122 10*3/uL — ABNORMAL LOW (ref 150–400)
RBC: 5.16 MIL/uL (ref 4.22–5.81)
RBC: 4.87 MIL/uL (ref 4.22–5.81)
RDW: 14.5 % (ref 11.5–15.5)
RDW: 14.4 % (ref 11.5–15.5)
WBC: 4.5 10*3/uL (ref 4.0–10.5)
WBC: 5 10*3/uL (ref 4.0–10.5)

## 2014-01-03 LAB — BASIC METABOLIC PANEL
BUN: 19 mg/dL (ref 6–23)
CALCIUM: 9.3 mg/dL (ref 8.4–10.5)
CHLORIDE: 106 meq/L (ref 96–112)
CO2: 24 mEq/L (ref 19–32)
CREATININE: 1.37 mg/dL — AB (ref 0.50–1.35)
GFR, EST AFRICAN AMERICAN: 57 mL/min — AB (ref >90)
GFR, EST NON AFRICAN AMERICAN: 49 mL/min — AB (ref >90)
Glucose, Bld: 106 mg/dL — ABNORMAL HIGH (ref 70–99)
Potassium: 5 mEq/L (ref 3.7–5.3)
Sodium: 144 mEq/L (ref 137–147)

## 2014-01-03 LAB — CREATININE, SERUM
Creatinine, Ser: 1.39 mg/dL — ABNORMAL HIGH (ref 0.50–1.35)
GFR calc non Af Amer: 48 mL/min — ABNORMAL LOW (ref >90)
GFR, EST AFRICAN AMERICAN: 56 mL/min — AB (ref >90)

## 2014-01-03 LAB — TROPONIN I
Troponin I: <0.3 ng/mL (ref <0.30)
Troponin I: <0.3 ng/mL (ref <0.30)
Troponin I: <0.3 ng/mL (ref <0.30)

## 2014-01-03 LAB — POCT I-STAT TROPONIN I: Troponin i, poc: 0.06 ng/mL (ref 0.00–0.08)

## 2014-01-03 LAB — RAPID URINE DRUG SCREEN, HOSP PERFORMED
AMPHETAMINES: NOT DETECTED
BENZODIAZEPINES: NOT DETECTED
Barbiturates: NOT DETECTED
Cocaine: POSITIVE — AB
Opiates: NOT DETECTED
Tetrahydrocannabinol: NOT DETECTED

## 2014-01-03 LAB — PRO B NATRIURETIC PEPTIDE: Pro B Natriuretic peptide (BNP): 16087 pg/mL — ABNORMAL HIGH (ref 0–450)

## 2014-01-03 MED ORDER — ONDANSETRON HCL 4 MG/2ML IJ SOLN: 4.0000 mg | Freq: Four times a day (QID) | INTRAMUSCULAR | Status: DC | PRN

## 2014-01-03 MED ORDER — LISINOPRIL 5 MG PO TABS
5.0000 mg | ORAL_TABLET | Freq: Every day | ORAL | Status: DC
  Administered 2014-01-03 – 2014-01-06 (×3): 5 mg via ORAL
  Filled 2014-01-03 (×4): qty 1

## 2014-01-03 MED ORDER — CARVEDILOL 6.25 MG PO TABS
6.2500 mg | ORAL_TABLET | Freq: Two times a day (BID) | ORAL | Status: DC
Start: 2014-01-03 — End: 2014-01-06
  Administered 2014-01-03 – 2014-01-06 (×6): 6.25 mg via ORAL
  Filled 2014-01-03 (×8): qty 1

## 2014-01-03 MED ORDER — FUROSEMIDE 10 MG/ML IJ SOLN
40.0000 mg | Freq: Once | INTRAMUSCULAR | Status: AC
Start: 2014-01-03 — End: 2014-01-03
  Administered 2014-01-03: 40 mg via INTRAVENOUS
  Filled 2014-01-03: qty 4

## 2014-01-03 MED ORDER — SODIUM CHLORIDE 0.9 % IJ SOLN
3.0000 mL | Freq: Two times a day (BID) | INTRAMUSCULAR | Status: DC
  Administered 2014-01-03 – 2014-01-05 (×4): 3 mL via INTRAVENOUS

## 2014-01-03 MED ORDER — SODIUM CHLORIDE 0.9 % IV SOLN: 250.0000 mL | INTRAVENOUS | Status: DC | PRN

## 2014-01-03 MED ORDER — ENOXAPARIN SODIUM 40 MG/0.4ML ~~LOC~~ SOLN
40.0000 mg | SUBCUTANEOUS | Status: DC
  Administered 2014-01-03 – 2014-01-05 (×3): 40 mg via SUBCUTANEOUS
  Filled 2014-01-03 (×6): qty 0.4

## 2014-01-03 MED ORDER — SODIUM CHLORIDE 0.9 % IJ SOLN: 3.0000 mL | INTRAMUSCULAR | Status: DC | PRN

## 2014-01-03 MED ORDER — ISOSORBIDE MONONITRATE ER 30 MG PO TB24
30.0000 mg | ORAL_TABLET | Freq: Every day | ORAL | Status: DC
  Administered 2014-01-03 – 2014-01-06 (×4): 30 mg via ORAL
  Filled 2014-01-03 (×4): qty 1

## 2014-01-03 MED ORDER — ALBUTEROL (5 MG/ML) CONTINUOUS INHALATION SOLN
10.0000 mg/h | INHALATION_SOLUTION | Freq: Once | RESPIRATORY_TRACT | Status: AC
  Administered 2014-01-03: 10 mg/h via RESPIRATORY_TRACT
  Filled 2014-01-03: qty 20

## 2014-01-03 MED ORDER — ACETAMINOPHEN 325 MG PO TABS: 650.0000 mg | ORAL_TABLET | ORAL | Status: DC | PRN

## 2014-01-03 MED ORDER — OXYCODONE HCL 5 MG PO TABS: 5.0000 mg | ORAL_TABLET | Freq: Four times a day (QID) | ORAL | Status: DC | PRN

## 2014-01-03 MED ORDER — ALBUTEROL SULFATE (2.5 MG/3ML) 0.083% IN NEBU
2.5000 mg | INHALATION_SOLUTION | RESPIRATORY_TRACT | Status: DC | PRN
  Administered 2014-01-03: 2.5 mg via RESPIRATORY_TRACT
  Filled 2014-01-03: qty 3

## 2014-01-03 MED ORDER — FUROSEMIDE 10 MG/ML IJ SOLN
40.0000 mg | Freq: Two times a day (BID) | INTRAMUSCULAR | Status: DC
  Administered 2014-01-03 – 2014-01-05 (×4): 40 mg via INTRAVENOUS
  Filled 2014-01-03 (×6): qty 4

## 2014-01-03 MED ORDER — POTASSIUM CHLORIDE CRYS ER 20 MEQ PO TBCR
20.0000 meq | EXTENDED_RELEASE_TABLET | Freq: Every day | ORAL | Status: DC
Start: 2014-01-03 — End: 2014-01-06
  Administered 2014-01-03 – 2014-01-06 (×4): 20 meq via ORAL
  Filled 2014-01-03 (×4): qty 1
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
## 2014-01-03 NOTE — Consult Note (Signed)
Pt. Seen and examined. Agree with the NP/[name] note as written. Mr. Knittle is breathing better with lasix but is still volume overloaded. Will need another 2-3 days of diuresis. Unfortunately, he has not been compliant after initial hospital follow-up.  He has a problem with cocaine dependence and continues to use. This does not make him a candidate for LifeVest or AICD in my opinion at this time. Will ask social work/case management with help in arranging drug-rehabilitation.    Thanks for consulting [place]. We will follow along with you.  Pixie Casino, MD, [facility] Attending Cardiologist [place]
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
## 2014-01-03 NOTE — ED Notes (Signed)
Pt from home c/o SOB with exertion, and cough. Hx hiatal hernia, CHF, AFib

## 2014-01-04 DIAGNOSIS — I059 Rheumatic mitral valve disease, unspecified: Secondary | ICD-10-CM

## 2014-01-04 LAB — BASIC METABOLIC PANEL
BUN: 21 mg/dL (ref 6–23)
CALCIUM: 9.3 mg/dL (ref 8.4–10.5)
CO2: 26 mEq/L (ref 19–32)
CREATININE: 1.62 mg/dL — AB (ref 0.50–1.35)
Chloride: 103 mEq/L (ref 96–112)
GFR calc Af Amer: 46 mL/min — ABNORMAL LOW (ref >90)
GFR, EST NON AFRICAN AMERICAN: 40 mL/min — AB (ref >90)
GLUCOSE: 101 mg/dL — AB (ref 70–99)
Potassium: 4.5 mEq/L (ref 3.7–5.3)
Sodium: 143 mEq/L (ref 137–147)

## 2014-01-04 LAB — URINALYSIS, ROUTINE W REFLEX MICROSCOPIC
BILIRUBIN URINE: NEGATIVE
GLUCOSE, UA: NEGATIVE mg/dL
HGB URINE DIPSTICK: NEGATIVE
KETONES UR: NEGATIVE mg/dL
Leukocytes, UA: NEGATIVE
Nitrite: NEGATIVE
PH: 7 (ref 5.0–8.0)
Protein, ur: NEGATIVE mg/dL
Specific Gravity, Urine: 1.007 (ref 1.005–1.030)
Urobilinogen, UA: 0.2 mg/dL (ref 0.0–1.0)

## 2014-01-04 LAB — MAGNESIUM: Magnesium: 1.9 mg/dL (ref 1.5–2.5)

## 2014-01-04 LAB — PRO B NATRIURETIC PEPTIDE: Pro B Natriuretic peptide (BNP): 8684 pg/mL — ABNORMAL HIGH (ref 0–450)

## 2014-01-04 NOTE — Progress Notes (Addendum)
Subjective: Feeling much better. Breathing has improved. No longer using [place].   Objective: Vital signs in last 24 hours: Temp:  [96.5 F (35.8 C)-98.6 F (37 C)] 98.3 F (36.8 C) (01/05 0404) Pulse Rate:  [77-101] 82 (01/05 0404) Resp:  [18-22] 18 (01/05 0404) BP: (104-140)/(54-96) 113/58 mmHg (01/05 0404) SpO2:  [92 %-98 %] 93 % (01/05 0404) Weight:  [147 lb 0.8 oz (66.7 kg)-149 lb 14.6 oz (68 kg)] 147 lb 0.8 oz (66.7 kg) (01/05 0404) Last BM Date: 01/03/14  Intake/Output from previous day: 01/04 0701 - 01/05 0700 In: 840 [P.O.:840] Out: 3100 [Urine:3100] Intake/Output this shift:    Medications Current Facility-Administered Medications  Medication Dose Route Frequency Provider Last Rate Last Dose  . 0.9 %  sodium chloride infusion  250 mL Intravenous PRN Jonetta Osgood, MD      . acetaminophen (TYLENOL) tablet 650 mg  650 mg Oral Q4H PRN Jonetta Osgood, MD      . albuterol (PROVENTIL) (2.5 MG/3ML) 0.083% nebulizer solution 2.5 mg  2.5 mg Nebulization Q2H PRN Jonetta Osgood, MD   2.5 mg at 01/03/14 2152  . carvedilol (COREG) tablet 6.25 mg  6.25 mg Oral BID WC Jonetta Osgood, MD   6.25 mg at 01/04/14 0609  . enoxaparin (LOVENOX) injection 40 mg  40 mg Subcutaneous Q24H Jonetta Osgood, MD   40 mg at 01/03/14 1040  . furosemide (LASIX) injection 40 mg  40 mg Intravenous Q12H Jonetta Osgood, MD   40 mg at 01/03/14 2015  . isosorbide mononitrate (IMDUR) 24 hr tablet 30 mg  30 mg Oral Daily Jonetta Osgood, MD   30 mg at 01/03/14 1041  . lisinopril (PRINIVIL,ZESTRIL) tablet 5 mg  5 mg Oral Daily Jonetta Osgood, MD   5 mg at 01/03/14 1040  . ondansetron (ZOFRAN) injection 4 mg  4 mg Intravenous Q6H PRN Shanker Kristeen Mans, MD      . oxyCODONE (Oxy IR/ROXICODONE) immediate release tablet 5 mg  5 mg Oral Q6H PRN Shanker Kristeen Mans, MD      . potassium chloride SA (K-DUR,KLOR-CON) CR tablet 20 mEq  20 mEq Oral Daily Jonetta Osgood, MD   20 mEq at 01/03/14 1040    . sodium chloride 0.9 % injection 3 mL  3 mL Intravenous Q12H Jonetta Osgood, MD   3 mL at 01/03/14 2015  . sodium chloride 0.9 % injection 3 mL  3 mL Intravenous PRN Shanker Kristeen Mans, MD        PE: General appearance: alert, cooperative and no distress Neck: no JVD Lungs: faint bibasilar rales Heart: regular rate and rhythm, S1, S2 normal, no murmur, click, rub or gallop Extremities: no LEE Pulses: 2+ and symmetric Skin: warm and dry Neurologic: Grossly normal  Lab Results:   Recent Labs  01/03/14 0458 01/03/14 1015  WBC 4.5 5.0  HGB 15.5 14.6  HCT 45.4 42.6  PLT 147* 122*   BMET  Recent Labs  01/03/14 0458 01/03/14 1015 01/04/14 0500  NA 144  --  143  K 5.0  --  4.5  CL 106  --  103  CO2 24  --  26  GLUCOSE 106*  --  101*  BUN 19  --  21  CREATININE 1.37* 1.39* 1.62*  CALCIUM 9.3  --  9.3    Recent Labs  01/03/14 1015 01/03/14 1400 01/03/14 2050  TROPONINI <0.30 <0.30 <0.30   BNP (last 3 results)  Recent Labs  07/12/13 0500 01/03/14 0450 01/04/14 0500  PROBNP 1047.0* 16087.0* 8684.0*   Filed Weights   01/03/14 0827 01/04/14 0404  Weight: 149 lb 14.6 oz (68 kg) 147 lb 0.8 oz (66.7 kg)    Studies/Results:  2D echo - pending  Assessment/Plan  Principal Problem:   Acute systolic CHF (congestive heart failure) Active Problems:   HTN (hypertension)   Cocaine abuse   Mitral regurgitation- moderate  Plan: Admitted for acute on chronic systolic CHF. NICM with EF of 25-30% on last echo 06/2013. Diuresing well. -3.1 urine output in last 24 hours. Weight is down 2 lbs at 147. His dry weight at time of last discharge in 06/2013 was 145 lb. BNP improved from 16,087 to 8,684. He continues to have faint bibasilar rales on physical exam. Will continue IV Lasix, 40 mg BID. Scr slightly increase at 1.62. Will continue to monitor renal function. Electrolytes are WNL. Will continue strict I/Os, daily weight, low sodium diet. Continue to diureses to dry  weight. 2D echo pending, although likely not a candidate for ICD, given history of recent drug use. MD to follow.     LOS: 1 day    Brittainy M. Rosita Fire, [name] 01/04/2014 7:50 AM Agree with assessment as noted above.  The patient is improved.  Admits to only minimal dyspnea now.  No chest discomfort.  Telemetry stable normal sinus rhythm with occasional PVC.  Lungs reveal only minimal inspiratory rales.  Heart reveals no gallop.  Plan continue diuresis.2-D echocardiogram results pending.

## 2014-01-04 NOTE — Progress Notes (Signed)
[facility] PROGRESS NOTE  Billy Parker WSF:[id number] DOB: 08/14/1936 DOA: 01/03/2014 PCP: [name], MD  Assessment/Plan: #1acute on chronic systolic CHF exacerbation Clinical improvement. Cardiac enzymes negative x3.pro BNP is trending down and currently at [place] from [address]. I/O = -2.26 L.2-D echo with a nonischemic cardiomyopathy with EF of 25-30% by last echo of 06/2013.2-D echo done on 1/5/ 2015 with EF of 30-35% with global hypokinesis. Continue Coreg, IV diuretics, imdur, lisinopril. Per cardiology patient not a candidate for ICD given history of recent drug use. Cardiology following and appreciate input and recommendations.  #2 hypertension Stable. Continue lisinopril, diuretics, Coreg, Imdur.  #3 history of cocaine abuse Social work consult confirmation of polysubstance abuse. I spoke to patient about cessation of cocaine.  #4 moderate mitral valve regurgitation Continue current regimen. Per cardiology.  #5 prophylaxis Lovenox for DVT prophylaxis.  Code Status: full Family Communication: updated patient no family at bedside. Disposition Plan: home when medically stable   Consultants:  Cardiology:Dr. Hilty 01/03/2014  Procedures:  2-D echo 01/04/2014  Chest x-ray 01/03/2014    Antibiotics:  none  HPI/Subjective: Patient states shortness of breath has improved.  Objective: Filed Vitals:   01/04/14 1046  BP: 99/60  Pulse:   Temp:   Resp:     Intake/Output Summary (Last 24 hours) at 01/04/14 1402 Last data filed at 01/04/14 0800  Gross per 24 hour  Intake    720 ml  Output   2550 ml  Net  -1830 ml   Filed Weights   01/03/14 0827 01/04/14 0404  Weight: 68 kg (149 lb 14.6 oz) 66.7 kg (147 lb 0.8 oz)    Exam:   General:  NAD  Cardiovascular: RRR  Respiratory: CTAB  Abdomen: Soft/NT/ND/+BS  Musculoskeletal: No c/c/e   Data Reviewed: Basic Metabolic Panel:  Recent Labs Lab 01/03/14 0458 01/03/14 1015 01/04/14 0500  NA 144   --  143  K 5.0  --  4.5  CL 106  --  103  CO2 24  --  26  GLUCOSE 106*  --  101*  BUN 19  --  21  CREATININE 1.37* 1.39* 1.62*  CALCIUM 9.3  --  9.3  MG  --   --  1.9   Liver Function Tests: No results found for this basename: AST, ALT, ALKPHOS, BILITOT, PROT, ALBUMIN,  in the last 168 hours No results found for this basename: LIPASE, AMYLASE,  in the last 168 hours No results found for this basename: AMMONIA,  in the last 168 hours CBC:  Recent Labs Lab 01/03/14 0458 01/03/14 1015  WBC 4.5 5.0  HGB 15.5 14.6  HCT 45.4 42.6  MCV 88.0 87.5  PLT 147* 122*   Cardiac Enzymes:  Recent Labs Lab 01/03/14 1015 01/03/14 1400 01/03/14 2050  TROPONINI <0.30 <0.30 <0.30   BNP (last 3 results)  Recent Labs  07/12/13 0500 01/03/14 0450 01/04/14 0500  PROBNP 1047.0* 16087.0* 8684.0*   CBG: No results found for this basename: GLUCAP,  in the last 168 hours  No results found for this or any previous visit (from the past 240 hour(s)).   Studies: Dg Chest Port 1 View  01/03/2014   CLINICAL DATA:  Shortness of breath  EXAM: PORTABLE CHEST - 1 VIEW  COMPARISON:  08/16/2013  FINDINGS: Cardiomegaly. Pulmonary venous congestion. No edema or consolidation. No effusion or pneumothorax.  IMPRESSION: Cardiomegaly and venous congestion.  No edema or consolidation.   Electronically Signed   By: Jorje Guild M.D.   On: 01/03/2014  05:52    Scheduled Meds: . carvedilol  6.25 mg Oral BID WC  . enoxaparin (LOVENOX) injection  40 mg Subcutaneous Q24H  . furosemide  40 mg Intravenous Q12H  . isosorbide mononitrate  30 mg Oral Daily  . lisinopril  5 mg Oral Daily  . potassium chloride  20 mEq Oral Daily  . sodium chloride  3 mL Intravenous Q12H   Continuous Infusions:   Principal Problem:   Acute systolic CHF (congestive heart failure) Active Problems:   HTN (hypertension)   Cocaine abuse   Mitral regurgitation- moderate    Time spent: 35 mins    [facility] MD [facility] Pager [phone]. If 7PM-7AM, please contact night-coverage at [email or web address], password [email or web address] 01/04/2014, 2:02 PM  LOS: 1 day

## 2014-01-04 NOTE — Progress Notes (Signed)
Echocardiogram 2D Echocardiogram has been performed.  Billy Parker 01/04/2014, 10:01 AM

## 2014-01-04 NOTE — Progress Notes (Signed)
Clinical Social Work Department BRIEF PSYCHOSOCIAL ASSESSMENT 01/04/2014  Patient:  Billy Parker, Billy Parker     Account Number:  [id number]     Admit date:  01/03/2014  Clinical Social Worker:  Adair Laundry  Date/Time:  01/04/2014 02:00 PM  Referred by:  Physician  Date Referred:  01/04/2014 Referred for  Substance Abuse   Other Referral:   Interview type:  Patient Other interview type:    PSYCHOSOCIAL DATA Living Status:  ALONE Admitted from facility:   Level of care:   Primary support name:   Primary support relationship to patient:   Degree of support available:   Pt reports having limited support available    CURRENT CONCERNS Current Concerns  Substance Abuse   Other Concerns:    SOCIAL WORK ASSESSMENT / PLAN CSW spoke with pt regarding substance abuse. Pt reports using "crack". CSW attempted to complete SBIRT with pt but pt not wanting to answer "specific questions". Pt did inform CSW that he has been using for many years and has been to different treatment programs. Pt reports last graduating form [employer] 4 years ago. Pt informed CSW the programs are great for him and work but eventually something will trigger his use. CSW provided pt with list of substance abuse treatment programs in the area. Pt was greatful. Pt also inquired as to how to find senior living as he believes the area he lives in now does not help him. CSW asked pt if he was familiar with senior resources of [place] stated that he is aware of their services. CSW informed pt that this would be a good place to contact to help find senior housing. Pt was thankful and appreciative of CSW. At this time pt has no further CSW needs. Please reconsult if needed.   Assessment/plan status:  No Further Intervention Required Other assessment/ plan:   Information/referral to community resources:   Substance abuse treatment programs, Tax adviser    PATIENT'S/FAMILY'S RESPONSE TO PLAN OF  CARE: Pt was thankful for resources provided       Jeanette Caprice, MSW, [place]

## 2014-01-04 NOTE — Progress Notes (Signed)
Utilization Review Completed.Billy Parker T1/04/2014  

## 2014-01-05 DIAGNOSIS — I2510 Atherosclerotic heart disease of native coronary artery without angina pectoris: Secondary | ICD-10-CM

## 2014-01-05 LAB — BASIC METABOLIC PANEL
BUN: 28 mg/dL — ABNORMAL HIGH (ref 6–23)
CALCIUM: 9.4 mg/dL (ref 8.4–10.5)
CO2: 28 mEq/L (ref 19–32)
Chloride: 97 mEq/L (ref 96–112)
Creatinine, Ser: 1.57 mg/dL — ABNORMAL HIGH (ref 0.50–1.35)
GFR calc Af Amer: 48 mL/min — ABNORMAL LOW (ref >90)
GFR, EST NON AFRICAN AMERICAN: 41 mL/min — AB (ref >90)
Glucose, Bld: 92 mg/dL (ref 70–99)
Potassium: 3.9 mEq/L (ref 3.7–5.3)
SODIUM: 140 meq/L (ref 137–147)

## 2014-01-05 LAB — URINE CULTURE: Colony Count: 2000

## 2014-01-05 MED ORDER — FUROSEMIDE 40 MG PO TABS
40.0000 mg | ORAL_TABLET | Freq: Two times a day (BID) | ORAL | Status: DC
  Administered 2014-01-05 – 2014-01-06 (×2): 40 mg via ORAL
  Filled 2014-01-05 (×4): qty 1

## 2014-01-05 NOTE — Progress Notes (Signed)
Patient Name: Billy Parker Date of Encounter: 01/05/2014     Principal Problem:   Acute systolic CHF (congestive heart failure) Active Problems:   HTN (hypertension)   Cocaine abuse   Mitral regurgitation- moderate    SUBJECTIVE  The patient is feeling better.  His dyspnea is improved.  Diuresing well.  His weight is down 2 more pounds since yesterday. CURRENT MEDS . carvedilol  6.25 mg Oral BID WC  . enoxaparin (LOVENOX) injection  40 mg Subcutaneous Q24H  . furosemide  40 mg Intravenous Q12H  . isosorbide mononitrate  30 mg Oral Daily  . lisinopril  5 mg Oral Daily  . potassium chloride  20 mEq Oral Daily  . sodium chloride  3 mL Intravenous Q12H    OBJECTIVE  Filed Vitals:   01/04/14 1345 01/04/14 1647 01/04/14 2018 01/05/14 0300  BP: 100/61 116/75 106/65 105/59  Pulse: 59 67 67 66  Temp: 97.9 F (36.6 C)  98 F (36.7 C) 97.9 F (36.6 C)  TempSrc: Oral  Oral Oral  Resp: 20  18 20   Height:      Weight:    145 lb 1 oz (65.8 kg)  SpO2: 94%  96% 98%    Intake/Output Summary (Last 24 hours) at 01/05/14 1041 Last data filed at 01/04/14 2037  Gross per 24 hour  Intake    720 ml  Output    650 ml  Net     70 ml   Filed Weights   01/03/14 0827 01/04/14 0404 01/05/14 0300  Weight: 149 lb 14.6 oz (68 kg) 147 lb 0.8 oz (66.7 kg) 145 lb 1 oz (65.8 kg)    PHYSICAL EXAM  General: Pleasant, NAD. Neuro: Alert and oriented X 3. Moves all extremities spontaneously. Psych: Normal affect. HEENT:  Normal  Neck: Supple without bruits or JVD. Lungs:  Resp regular and unlabored, CTA. Heart: RRR no s3, s4, or murmurs. Abdomen: Soft, non-tender, non-distended, BS + x 4.  Extremities: No clubbing, cyanosis or edema. DP/PT/Radials 2+ and equal bilaterally.  Accessory Clinical Findings  CBC  Recent Labs  01/03/14 0458 01/03/14 1015  WBC 4.5 5.0  HGB 15.5 14.6  HCT 45.4 42.6  MCV 88.0 87.5  PLT 147* 976*   Basic Metabolic Panel  Recent Labs  01/04/14 0500  01/05/14 0304  NA 143 140  K 4.5 3.9  CL 103 97  CO2 26 28  GLUCOSE 101* 92  BUN 21 28*  CREATININE 1.62* 1.57*  CALCIUM 9.3 9.4  MG 1.9  --    Liver Function Tests No results found for this basename: AST, ALT, ALKPHOS, BILITOT, PROT, ALBUMIN,  in the last 72 hours No results found for this basename: LIPASE, AMYLASE,  in the last 72 hours Cardiac Enzymes  Recent Labs  01/03/14 1015 01/03/14 1400 01/03/14 2050  TROPONINI <0.30 <0.30 <0.30   BNP No components found with this basename: POCBNP,  D-Dimer No results found for this basename: DDIMER,  in the last 72 hours Hemoglobin A1C No results found for this basename: HGBA1C,  in the last 72 hours Fasting Lipid Panel No results found for this basename: CHOL, HDL, LDLCALC, TRIG, CHOLHDL, LDLDIRECT,  in the last 72 hours Thyroid Function Tests No results found for this basename: TSH, T4TOTAL, FREET3, T3FREE, THYROIDAB,  in the last 72 hours  TELE   Normal sinus rhythm with occasional PVC and paired Pvcs  ECG  2D Echo: Study Conclusions  - Left ventricle: The cavity size was normal.  Wall thickness was increased in a pattern of moderate LVH. Systolic function was moderately to severely reduced. The estimated ejection fraction was in the range of 30% to 35%. Global hypokinesis, with more predominant inferolateral hypokinesis. LV diastolic function cannot be assessed due to a-fib. The E/e' ratio, however, is >15, suggesting markedly elevated LV filling pressure. - Mitral valve: Mildly thickened leaflets. Theanterior leaflet is sclerotic. Moderate regurgitation, posteriorly directed. - Left atrium: The atrium was normal in size. - Right ventricle: The cavity size was mildly dilated. - Atrial septum: No defect or patent foramen ovale was identified. - Inferior vena cava: The vessel was normal in size; the respirophasic diameter changes were in the normal range (= 50%); findings are consistent with normal central  venous pressure. - Pericardium, extracardiac: There was no pericardial effusion.   Radiology/Studies  Dg Chest Port 1 View  01/03/2014   CLINICAL DATA:  Shortness of breath  EXAM: PORTABLE CHEST - 1 VIEW  COMPARISON:  08/16/2013  FINDINGS: Cardiomegaly. Pulmonary venous congestion. No edema or consolidation. No effusion or pneumothorax.  IMPRESSION: Cardiomegaly and venous congestion.  No edema or consolidation.   Electronically Signed   By: Jorje Guild M.D.   On: 01/03/2014 05:52    ASSESSMENT AND PLAN Principal Problem:  Acute systolic CHF (congestive heart failure)  Active Problems:  HTN (hypertension)  Cocaine abuse  Mitral regurgitation- moderate  Plan: Will switch to oral Lasix today.  Possibly home in a.m. if continues to do well.  I spoke with him about the importance of avoiding cocaine.   Signed, Darlin Coco MD

## 2014-01-05 NOTE — Care Management Note (Unsigned)
    Page 1 of 1   01/05/2014     3:06:19 PM   CARE MANAGEMENT NOTE 01/05/2014  Patient:  Billy Parker, Billy Parker   Account Number:  [id number]  Date Initiated:  01/05/2014  Documentation initiated by:  Daisa Stennis  Subjective/Objective Assessment:   PT ADM ON 01/03/14 WITH CHF, PSA.  PTA, PT INDEPENDENT, LIVES ALONE.     Action/Plan:   WILL FOLLOW FOR DC NEEDS AS PT PROGRESSES.  CSW CONSULTED FOR PSA COUNSELING.   Anticipated DC Date:  01/07/2014   Anticipated DC Plan:  [place]  CM consult      Choice offered to / List presented to:             Status of service:  In process, will continue to follow Medicare Important Message given?   (If response is "NO", the following Medicare IM given date fields will be blank) Date Medicare IM given:   Date Additional Medicare IM given:    Discharge Disposition:    Per UR Regulation:  Reviewed for med. necessity/level of care/duration of stay  If discussed at [place] of Stay Meetings, dates discussed:    Comments:

## 2014-01-05 NOTE — Progress Notes (Signed)
[facility] PROGRESS NOTE  Timber Lucarelli UKG:[id number] DOB: 08-14-1936 DOA: 01/03/2014 PCP: [name], MD  Assessment/Plan: #1acute on chronic systolic CHF exacerbation Clinical improvement. Cardiac enzymes negative x3.pro BNP is trending down and currently at [place] from [address]. I/O = -2.39 L.2-D echo with a nonischemic cardiomyopathy with EF of 25-30% by last echo of 06/2013.2-D echo done on 1/5/ 2015 with EF of 30-35% with global hypokinesis. Continue Coreg, IV diuretics, imdur, lisinopril. Per cardiology patient not a candidate for ICD given history of recent drug use. Cardiology following and appreciate input and recommendations.  #2 hypertension Stable. Continue lisinopril, diuretics, Coreg, Imdur.  #3 history of cocaine abuse Social work has given patient info on cessation of polysubstance abuse. I spoke to patient about cessation of cocaine.  #4 moderate mitral valve regurgitation Continue current regimen. Per cardiology.  #5 prophylaxis Lovenox for DVT prophylaxis.  Code Status: full Family Communication: updated patient no family at bedside. Disposition Plan: home when medically stable   Consultants:  Cardiology:Dr. Hilty 01/03/2014  Procedures:  2-D echo 01/04/2014  Chest x-ray 01/03/2014    Antibiotics:  none  HPI/Subjective: Patient states shortness of breath has improved. No CP  Objective: Filed Vitals:   01/05/14 0300  BP: 105/59  Pulse: 66  Temp: 97.9 F (36.6 C)  Resp: 20    Intake/Output Summary (Last 24 hours) at 01/05/14 1042 Last data filed at 01/04/14 2037  Gross per 24 hour  Intake    720 ml  Output    650 ml  Net     70 ml   Filed Weights   01/03/14 0827 01/04/14 0404 01/05/14 0300  Weight: 68 kg (149 lb 14.6 oz) 66.7 kg (147 lb 0.8 oz) 65.8 kg (145 lb 1 oz)    Exam:   General:  NAD  Cardiovascular: RRR  Respiratory: CTAB  Abdomen: Soft/NT/ND/+BS  Musculoskeletal: No c/c/e   Data Reviewed: Basic Metabolic  Panel:  Recent Labs Lab 01/03/14 0458 01/03/14 1015 01/04/14 0500 01/05/14 0304  NA 144  --  143 140  K 5.0  --  4.5 3.9  CL 106  --  103 97  CO2 24  --  26 28  GLUCOSE 106*  --  101* 92  BUN 19  --  21 28*  CREATININE 1.37* 1.39* 1.62* 1.57*  CALCIUM 9.3  --  9.3 9.4  MG  --   --  1.9  --    Liver Function Tests: No results found for this basename: AST, ALT, ALKPHOS, BILITOT, PROT, ALBUMIN,  in the last 168 hours No results found for this basename: LIPASE, AMYLASE,  in the last 168 hours No results found for this basename: AMMONIA,  in the last 168 hours CBC:  Recent Labs Lab 01/03/14 0458 01/03/14 1015  WBC 4.5 5.0  HGB 15.5 14.6  HCT 45.4 42.6  MCV 88.0 87.5  PLT 147* 122*   Cardiac Enzymes:  Recent Labs Lab 01/03/14 1015 01/03/14 1400 01/03/14 2050  TROPONINI <0.30 <0.30 <0.30   BNP (last 3 results)  Recent Labs  07/12/13 0500 01/03/14 0450 01/04/14 0500  PROBNP 1047.0* 16087.0* 8684.0*   CBG: No results found for this basename: GLUCAP,  in the last 168 hours  No results found for this or any previous visit (from the past 240 hour(s)).   Studies: No results found.  Scheduled Meds: . carvedilol  6.25 mg Oral BID WC  . enoxaparin (LOVENOX) injection  40 mg Subcutaneous Q24H  . furosemide  40 mg Intravenous Q12H  .  isosorbide mononitrate  30 mg Oral Daily  . lisinopril  5 mg Oral Daily  . potassium chloride  20 mEq Oral Daily  . sodium chloride  3 mL Intravenous Q12H   Continuous Infusions:   Principal Problem:   Acute systolic CHF (congestive heart failure) Active Problems:   HTN (hypertension)   Cocaine abuse   Mitral regurgitation- moderate    Time spent: 35 mins    [facility] MD [facility] Pager [phone]. If 7PM-7AM, please contact night-coverage at [email or web address], password [facility] 01/05/2014, 10:42 AM  LOS: 2 days

## 2014-01-06 LAB — BASIC METABOLIC PANEL
BUN: 28 mg/dL — ABNORMAL HIGH (ref 6–23)
CALCIUM: 9.2 mg/dL (ref 8.4–10.5)
CO2: 27 mEq/L (ref 19–32)
Chloride: 99 mEq/L (ref 96–112)
Creatinine, Ser: 1.71 mg/dL — ABNORMAL HIGH (ref 0.50–1.35)
GFR calc Af Amer: 43 mL/min — ABNORMAL LOW (ref >90)
GFR, EST NON AFRICAN AMERICAN: 37 mL/min — AB (ref >90)
Glucose, Bld: 90 mg/dL (ref 70–99)
POTASSIUM: 4 meq/L (ref 3.7–5.3)
SODIUM: 140 meq/L (ref 137–147)

## 2014-01-06 MED ORDER — FUROSEMIDE 40 MG PO TABS: 40.0000 mg | ORAL_TABLET | Freq: Two times a day (BID) | ORAL | Status: DC

## 2014-01-06 NOTE — Discharge Summary (Signed)
Physician Discharge Summary  Billy Parker JAS:[id number] DOB: 1936-03-11 DOA: 01/03/2014  PCP: [name], MD  Admit date: 01/03/2014 Discharge date: 01/06/2014  Time spent: 40 minutes  Recommendations for Outpatient Follow-up:  1. Patient will need to follow up with PCP within one week of discharge.  2. Patient will need to follow up with cardiology on 01/12/14 as scheduled. At that time BMET will be rechecked to moitor renal function.  Discharge Diagnoses:  Principal Problem:   Acute systolic CHF (congestive heart failure) Active Problems:   HTN (hypertension)   Cocaine abuse   Mitral regurgitation- moderate   Discharge Condition: good  Diet recommendation: heart healthy low sodium  Filed Weights   01/04/14 0404 01/05/14 0300 01/06/14 0533  Weight: 66.7 kg (147 lb 0.8 oz) 65.8 kg (145 lb 1 oz) 64.91 kg (143 lb 1.6 oz)    History of present illness:  Billy Parker is a 78 y.o. male with a Past Medical History of chronic systolic heart failure, cocaine use who presents today with the above noted complaint. Per patient, for the past 2 weeks he has had dyspnea primarily on exertion, however for the past 2 days, this has progressed and has gotten much worse. He now claims to have had significant orthopnea and could not sleep last night. Subsequently he presented to the ED, where he was found to have acute decompensated systolic heart failure, I was asked to admit this patient for further evaluation and treatment.  Patient denies any fever, headache, chest pain, nausea, vomiting or diarrhea. He also denies any abdominal pain. Unfortunately, he continues to abuse cocaine.  Hospital Course:  1) acute on chronic systolic CHF exacerbation:  Clinical improvement. Cardiac enzymes negative x3.pro BNP is trending down and currently at [place] from [address]. I/O = -2.39 L.2-D echo with a nonischemic cardiomyopathy with EF of 25-30% by last echo of 06/2013.2-D echo done on 1/5/ 2015 with EF of 30-35%  with global hypokinesis. Continue Coreg, IV diuretics, imdur, lisinopril. Per cardiology patient not a candidate for ICD given history of recent drug use.  He will follow up with cardiology as an out patient.  2) hypertension: stable.  Continue lisinopril, diuretics, coreg, imdur  3) history of cocaine abuse: Social work has given information and counseling to patient regarding cessation.   I spoke to the patient regarding cessation.  He plans to find new housing to avoid exposure.  Is familiar with community support resources.  4) moderate mitral valve regurgitation: stable. Continue current regimen.  5) acute renal injury: creatinine up during admission due to diuresis.  Baseline seems to be around 1.2-1.3, at discharge Cr is 1.7.  He will need to follow up with his PCP for further evaluation.    6) dispo: discarge to home.  Procedures:  2-D ECHO 01/04/14  Chest xray 01/03/14  Consultations:  Cardiology Dr. [name] 01/03/14  Discharge Exam: Filed Vitals:   01/06/14 1045  BP: 112/57  Pulse:   Temp:   Resp:     General: alert, no distress, up and walking in hall Cardiovascular: RRR no mrg Respiratory: CTAB  Discharge Instructions      Discharge Orders   Future Appointments Provider Department Dept Phone   01/12/2014 10:00 AM Brittainy Ladoris Gene [facility] [phone]   01/13/2014 11:00 AM Adin Hector, MD [facility] [phone]   Future Orders Complete By Expires   (HEART FAILURE PATIENTS) Call MD:  Anytime you have any of the following symptoms: 1) 3 pound weight gain  in 24 hours or 5 pounds in 1 week 2) shortness of breath, with or without a dry hacking cough 3) swelling in the hands, feet or stomach 4) if you have to sleep on extra pillows at night in order to breathe.  As directed    Call MD for:  difficulty breathing, headache or visual disturbances  As directed    Call MD for:  persistant dizziness or light-headedness  As directed     Diet - low sodium heart healthy  As directed    Discharge instructions  As directed    Comments:     Please keep all follow up appointments after discharge.   You need to maintain a healthy lifestyle to prevent further hospitalizations.   Increase activity slowly  As directed        Medication List    STOP taking these medications       hydrochlorothiazide 12.5 MG capsule  Commonly known as:  MICROZIDE      TAKE these medications       carvedilol 6.25 MG tablet  Commonly known as:  COREG  Take 1 tablet (6.25 mg total) by mouth 2 (two) times daily with a meal.     furosemide 40 MG tablet  Commonly known as:  LASIX  Take 1 tablet (40 mg total) by mouth 2 (two) times daily.     isosorbide mononitrate 30 MG 24 hr tablet  Commonly known as:  IMDUR  Take 1 tablet (30 mg total) by mouth daily.     lisinopril 5 MG tablet  Commonly known as:  PRINIVIL,ZESTRIL  Take 1 tablet (5 mg total) by mouth daily.       No Known Allergies Follow-up Information   Follow up with Lyda Jester, [name] On 01/12/2014. (10:00 am )    Specialty:  Cardiology   Contact information:   [address] [place] [address] [phone]       Follow up with [facility], MD In 1 week.   Specialty:  General Practice   Contact information:   [address] [place] [address] [phone]        The results of significant diagnostics from this hospitalization (including imaging, microbiology, ancillary and laboratory) are listed below for reference.    Significant Diagnostic Studies: Dg Chest Port 1 View  01/03/2014   CLINICAL DATA:  Shortness of breath  EXAM: PORTABLE CHEST - 1 VIEW  COMPARISON:  08/16/2013  FINDINGS: Cardiomegaly. Pulmonary venous congestion. No edema or consolidation. No effusion or pneumothorax.  IMPRESSION: Cardiomegaly and venous congestion.  No edema or consolidation.   Electronically Signed   By: Jorje Guild M.D.   On: 01/03/2014  05:52    Microbiology: Recent Results (from the past 240 hour(s))  URINE CULTURE     Status: None   Collection Time    01/04/14 12:27 PM      Result Value Range Status   Specimen Description URINE, RANDOM   Final   Special Requests NONE   Final   Culture  Setup Time     Final   Value: 01/04/2014 00:00     Performed at [facility] Count     Final   Value: 2,000 COLONIES/ML     Performed at [facility]   Culture     Final   Value: INSIGNIFICANT GROWTH     Performed at [facility]   Report Status 01/05/2014 FINAL   Final  Labs: Basic Metabolic Panel:  Recent Labs Lab 01/03/14 0458 01/03/14 1015 01/04/14 0500 01/05/14 0304 01/06/14 0305  NA 144  --  143 140 140  K 5.0  --  4.5 3.9 4.0  CL 106  --  103 97 99  CO2 24  --  26 28 27   GLUCOSE 106*  --  101* 92 90  BUN 19  --  21 28* 28*  CREATININE 1.37* 1.39* 1.62* 1.57* 1.71*  CALCIUM 9.3  --  9.3 9.4 9.2  MG  --   --  1.9  --   --    Liver Function Tests: No results found for this basename: AST, ALT, ALKPHOS, BILITOT, PROT, ALBUMIN,  in the last 168 hours No results found for this basename: LIPASE, AMYLASE,  in the last 168 hours No results found for this basename: AMMONIA,  in the last 168 hours CBC:  Recent Labs Lab 01/03/14 0458 01/03/14 1015  WBC 4.5 5.0  HGB 15.5 14.6  HCT 45.4 42.6  MCV 88.0 87.5  PLT 147* 122*   Cardiac Enzymes:  Recent Labs Lab 01/03/14 1015 01/03/14 1400 01/03/14 2050  TROPONINI <0.30 <0.30 <0.30   BNP: BNP (last 3 results)  Recent Labs  07/12/13 0500 01/03/14 0450 01/04/14 0500  PROBNP 1047.0* 16087.0* 8684.0*   CBG: No results found for this basename: GLUCAP,  in the last 168 hours     Signed:  WALSH,CATHERINE  [facility] 01/06/2014, 11:42 AM

## 2014-01-06 NOTE — Progress Notes (Addendum)
01/06/2014 0825 PT. Noted to be off telemetry monitor. Upon assessment, pt fully dressed states he removed his telemetry box because he was told he could go home today. Dr. Volanda Napoleon in hallway and stated is was ok to leave patient off telemetry for now. Central telemetry updated on situation. Explained to patient that if cardiology decides to keep patient one more day the heart monitor may have to be replaced. Pt. Voiced understanding. Pt. Also stated he had removed his IV site from Left hand and catheter was intact. Pt. States he placed IV in sharps box. Education provided to patient regarding RN availability to remove IV site once discharge was confirmed. Site inspected. No trauma or abnormality noted. Will continue to closely monitor patient.  Billy Parker, Billy Parker

## 2014-01-06 NOTE — Progress Notes (Signed)
01/06/2014 1230 Discharge avs form, medications already taken today and those due this evening given and explained to patient. Location of called in rx reviewed. Follow up appointments and when to call MD reviewed. Pt. Given heart failure education packet and contents reviewed along with daily weights and low sodium diet. Pt. Verbalized understanding. Pt. Self removed IV access earlier in shift as well as telemetry. Site inspected again and WNL. D/c home per orders.  Inger Wiest, Arville Lime

## 2014-01-06 NOTE — Progress Notes (Signed)
Per RN, patient needs a bus pass. CSW provided bus pass to nurse to give to patient. CSW signing off.  Jeanette Caprice, MSW, [place]

## 2014-01-06 NOTE — Progress Notes (Signed)
Subjective: SOB improved. He was able to sleep supine last PM w/o orthopnea or PND. Ambuating w/o difficulty.,   Objective: Vital signs in last 24 hours: Temp:  [97.4 F (36.3 C)-97.9 F (36.6 C)] 97.6 F (36.4 C) (01/07 0533) Pulse Rate:  [62-73] 70 (01/07 0533) Resp:  [18-20] 20 (01/07 0533) BP: (99-135)/(63-86) 99/65 mmHg (01/07 0533) SpO2:  [94 %-100 %] 94 % (01/07 0533) Weight:  [143 lb 1.6 oz (64.91 kg)] 143 lb 1.6 oz (64.91 kg) (01/07 0533) Last BM Date: 01/04/14  Intake/Output from previous day: 01/06 0701 - 01/07 0700 In: 51 [P.O.:960] Out: 1702 [Urine:1700; Stool:2] Intake/Output this shift:    Medications Current Facility-Administered Medications  Medication Dose Route Frequency Provider Last Rate Last Dose  . 0.9 %  sodium chloride infusion  250 mL Intravenous PRN Jonetta Osgood, MD      . acetaminophen (TYLENOL) tablet 650 mg  650 mg Oral Q4H PRN Jonetta Osgood, MD      . albuterol (PROVENTIL) (2.5 MG/3ML) 0.083% nebulizer solution 2.5 mg  2.5 mg Nebulization Q2H PRN Jonetta Osgood, MD   2.5 mg at 01/03/14 2152  . carvedilol (COREG) tablet 6.25 mg  6.25 mg Oral BID WC Jonetta Osgood, MD   6.25 mg at 01/06/14 0700  . enoxaparin (LOVENOX) injection 40 mg  40 mg Subcutaneous Q24H Jonetta Osgood, MD   40 mg at 01/05/14 1058  . furosemide (LASIX) tablet 40 mg  40 mg Oral BID Darlin Coco, MD   40 mg at 01/05/14 1800  . isosorbide mononitrate (IMDUR) 24 hr tablet 30 mg  30 mg Oral Daily Jonetta Osgood, MD   30 mg at 01/05/14 1057  . lisinopril (PRINIVIL,ZESTRIL) tablet 5 mg  5 mg Oral Daily Jonetta Osgood, MD   5 mg at 01/05/14 1057  . ondansetron (ZOFRAN) injection 4 mg  4 mg Intravenous Q6H PRN Shanker Kristeen Mans, MD      . oxyCODONE (Oxy IR/ROXICODONE) immediate release tablet 5 mg  5 mg Oral Q6H PRN Shanker Kristeen Mans, MD      . potassium chloride SA (K-DUR,KLOR-CON) CR tablet 20 mEq  20 mEq Oral Daily Jonetta Osgood, MD   20 mEq at  01/05/14 1057  . sodium chloride 0.9 % injection 3 mL  3 mL Intravenous Q12H Jonetta Osgood, MD   3 mL at 01/05/14 0917  . sodium chloride 0.9 % injection 3 mL  3 mL Intravenous PRN Jonetta Osgood, MD        PE: General appearance: alert, cooperative and no distress Neck: no JVD Lungs: clear to auscultation bilaterally Heart: no LEE Extremities: extremities normal, atraumatic, no cyanosis or edema Pulses: warm and dry Skin: Skin color, texture, turgor normal. No rashes or lesions Neurologic: Grossly normal  Lab Results:   Recent Labs  01/03/14 1015  WBC 5.0  HGB 14.6  HCT 42.6  PLT 122*   BMET  Recent Labs  01/04/14 0500 01/05/14 0304 01/06/14 0305  NA 143 140 140  K 4.5 3.9 4.0  CL 103 97 99  CO2 26 28 27   GLUCOSE 101* 92 90  BUN 21 28* 28*  CREATININE 1.62* 1.57* 1.71*  CALCIUM 9.3 9.4 9.2   Filed Weights   01/04/14 0404 01/05/14 0300 01/06/14 0533  Weight: 147 lb 0.8 oz (66.7 kg) 145 lb 1 oz (65.8 kg) 143 lb 1.6 oz (64.91 kg)   Assessment/Plan    Principal Problem:  Acute systolic CHF (congestive heart failure) Active Problems:   HTN (hypertension)   Cocaine abuse   Mitral regurgitation- moderate  Plan: SOB improved. He was able to sleep supine last PM w/o orthopnea or PND. Ambuating w/o difficulty. I/Os negative. He has diuresed 3L since admission. His weight today is 143 lb, which is 2 lbs below his dry weight. Physical exam normal. HR and BP both stable. On PO Lasix, 40 mg BID. SCr slightly up from yesterday at 1.71. Baseline ~1.3. From a cardiac standpoint, he appears to be stable enough for discharge. However, may consider rechecking Scr in several days as an outpatient with a BMP. If home today, continue BB, ACE-I, Diuretic and Nitrate. We will arrange f/u at [facility] office. MD to follow with further recommendations.     LOS: 3 days    Brittainy M. Ladoris Gene 01/06/2014 7:49 AM The patient is doing well today.  He is stable for  discharge.  We will continue current Lasix 40 mg twice a day and have him return to the [facility] office in several days for followup BMET.

## 2014-01-12 ENCOUNTER — Ambulatory Visit: Payer: Medicare HMO | Admitting: Cardiology

## 2014-01-13 ENCOUNTER — Encounter (INDEPENDENT_AMBULATORY_CARE_PROVIDER_SITE_OTHER): Payer: Self-pay

## 2014-01-13 ENCOUNTER — Encounter (INDEPENDENT_AMBULATORY_CARE_PROVIDER_SITE_OTHER): Payer: Self-pay | Admitting: Surgery

## 2014-01-13 ENCOUNTER — Ambulatory Visit (INDEPENDENT_AMBULATORY_CARE_PROVIDER_SITE_OTHER): Payer: Medicare HMO | Admitting: Surgery

## 2014-01-13 VITALS — BP 150/80 | HR 84 | Temp 97.8°F | Resp 14 | Ht 65.0 in | Wt 150.4 lb

## 2014-01-13 DIAGNOSIS — I2589 Other forms of chronic ischemic heart disease: Secondary | ICD-10-CM

## 2014-01-13 DIAGNOSIS — I255 Ischemic cardiomyopathy: Secondary | ICD-10-CM

## 2014-01-13 DIAGNOSIS — N183 Chronic kidney disease, stage 3 (moderate): Secondary | ICD-10-CM

## 2014-01-13 DIAGNOSIS — N182 Chronic kidney disease, stage 2 (mild): Secondary | ICD-10-CM | POA: Insufficient documentation

## 2014-01-13 DIAGNOSIS — K4090 Unilateral inguinal hernia, without obstruction or gangrene, not specified as recurrent: Secondary | ICD-10-CM | POA: Insufficient documentation

## 2014-01-13 NOTE — Patient Instructions (Signed)
See the Handout(s) we gave you.  Consider surgery to repair the hernia in her right groin/inguinal region..    Please call our office at [phone] if you wish to schedule surgery or if you have further questions / concerns.   Hernia A hernia occurs when an internal organ pushes out through a weak spot in the abdominal wall. Hernias most commonly occur in the groin and around the navel. Hernias often can be pushed back into place (reduced). Most hernias tend to get worse over time. Some abdominal hernias can get stuck in the opening (irreducible or incarcerated hernia) and cannot be reduced. An irreducible abdominal hernia which is tightly squeezed into the opening is at risk for impaired blood supply (strangulated hernia). A strangulated hernia is a medical emergency. Because of the risk for an irreducible or strangulated hernia, surgery may be recommended to repair a hernia. CAUSES   Heavy lifting.  Prolonged coughing.  Straining to have a bowel movement.  A cut (incision) made during an abdominal surgery. HOME CARE INSTRUCTIONS   Bed rest is not required. You may continue your normal activities.  Avoid lifting more than 10 pounds (4.5 kg) or straining.  Cough gently. If you are a smoker it is best to stop. Even the best hernia repair can break down with the continual strain of coughing. Even if you do not have your hernia repaired, a cough will continue to aggravate the problem.  Do not wear anything tight over your hernia. Do not try to keep it in with an outside bandage or truss. These can damage abdominal contents if they are trapped within the hernia sac.  Eat a normal diet.  Avoid constipation. Straining over long periods of time will increase hernia size and encourage breakdown of repairs. If you cannot do this with diet alone, stool softeners may be used. SEEK IMMEDIATE MEDICAL CARE IF:   You have a fever.  You develop increasing abdominal pain.  You feel nauseous or  vomit.  Your hernia is stuck outside the abdomen, looks discolored, feels hard, or is tender.  You have any changes in your bowel habits or in the hernia that are unusual for you.  You have increased pain or swelling around the hernia.  You cannot push the hernia back in place by applying gentle pressure while lying down. MAKE SURE YOU:   Understand these instructions.  Will watch your condition.  Will get help right away if you are not doing well or get worse. Document Released: 12/17/2005 Document Revised: 03/10/2012 Document Reviewed: 08/05/2008 [facility] Patient Information 2014 [place].  Managing Pain  Pain after surgery or related to activity is often due to strain/injury to muscle, tendon, nerves and/or incisions.  This pain is usually short-term and will improve in a few months.   Many people find it helpful to do the following things TOGETHER to help speed the process of healing and to get back to regular activity more quickly:  1. Avoid heavy physical activity a.  no lifting greater than 20 pounds b. Do not "push through" the pain.  Listen to your body and avoid positions and maneuvers than reproduce the pain c. Walking is okay as tolerated, but go slowly and stop when getting sore.  d. Remember: If it hurts to do it, then don't do it! 2. Take Anti-inflammatory medication  a. Take with food/snack around the clock for 1-2 weeks i. This helps the muscle and nerve tissues become less irritable and calm down faster  b. Choose ONE of the following over-the-counter medications: i. Naproxen 220mg  tabs (ex. Aleve) 1-2 pills twice a day  ii. Ibuprofen 200mg  tabs (ex. Advil, Motrin) 3-4 pills with every meal and just before bedtime iii. Acetaminophen 500mg  tabs (Tylenol) 1-2 pills with every meal and just before bedtime 3. Use a Heating pad or Ice/Cold Pack a. 4-6 times a day b. May use warm bath/hottub  or showers 4. Try Gentle Massage and/or Stretching  a. at the area  of pain many times a day b. stop if you feel pain - do not overdo it  Try these steps together to help you body heal faster and avoid making things get worse.  Doing just one of these things may not be enough.    If you are not getting better after two weeks or are noticing you are getting worse, contact our office for further advice; we may need to re-evaluate you & see what other things we can do to help.  GETTING TO GOOD BOWEL HEALTH. Irregular bowel habits such as constipation and diarrhea can lead to many problems over time.  Having one soft bowel movement a day is the most important way to prevent further problems.  The anorectal canal is designed to handle stretching and feces to safely manage our ability to get rid of solid waste (feces, poop, stool) out of our body.  BUT, hard constipated stools can act like ripping concrete bricks and diarrhea can be a burning fire to this very sensitive area of our body, causing inflamed hemorrhoids, anal fissures, increasing risk is perirectal abscesses, abdominal pain/bloating, an making irritable bowel worse.     The goal: ONE SOFT BOWEL MOVEMENT A DAY!  To have soft, regular bowel movements:    Drink at least 8 tall glasses of water a day.     Take plenty of fiber.  Fiber is the undigested part of plant food that passes into the colon, acting s "natures broom" to encourage bowel motility and movement.  Fiber can absorb and hold large amounts of water. This results in a larger, bulkier stool, which is soft and easier to pass. Work gradually over several weeks up to 6 servings a day of fiber (25g a day even more if needed) in the form of: o Vegetables -- Root (potatoes, carrots, turnips), leafy green (lettuce, salad greens, celery, spinach), or cooked high residue (cabbage, broccoli, etc) o Fruit -- Fresh (unpeeled skin & pulp), Dried (prunes, apricots, cherries, etc ),  or stewed ( applesauce)  o Whole grain breads, pasta, etc (whole wheat)  o Bran  cereals    Bulking Agents -- This type of water-retaining fiber generally is easily obtained each day by one of the following:  o Psyllium bran -- The psyllium plant is remarkable because its ground seeds can retain so much water. This product is available as Metamucil, Konsyl, Effersyllium, Per Diem Fiber, or the less expensive generic preparation in drug and health food stores. Although labeled a laxative, it really is not a laxative.  o Methylcellulose -- This is another fiber derived from wood which also retains water. It is available as Citrucel. o Polyethylene Glycol - and "artificial" fiber commonly called Miralax or Glycolax.  It is helpful for people with gassy or bloated feelings with regular fiber o Flax Seed - a less gassy fiber than psyllium   No reading or other relaxing activity while on the toilet. If bowel movements take longer than 5 minutes, you are too constipated  AVOID CONSTIPATION.  High fiber and water intake usually takes care of this.  Sometimes a laxative is needed to stimulate more frequent bowel movements, but    Laxatives are not a good long-term solution as it can wear the colon out. o Osmotics (Milk of Magnesia, Fleets phosphosoda, Magnesium citrate, MiraLax, GoLytely) are safer than  o Stimulants (Senokot, Castor Oil, Dulcolax, Ex Lax)    o Do not take laxatives for more than 7days in a row.    IF SEVERELY CONSTIPATED, try a Bowel Retraining Program: o Do not use laxatives.  o Eat a diet high in roughage, such as bran cereals and leafy vegetables.  o Drink six (6) ounces of prune or apricot juice each morning.  o Eat two (2) large servings of stewed fruit each day.  o Take one (1) heaping tablespoon of a psyllium-based bulking agent twice a day. Use sugar-free sweetener when possible to avoid excessive calories.  o Eat a normal breakfast.  o Set aside 15 minutes after breakfast to sit on the toilet, but do not strain to have a bowel movement.  o If you do not  have a bowel movement by the third day, use an enema and repeat the above steps.    Controlling diarrhea o Switch to liquids and simpler foods for a few days to avoid stressing your intestines further. o Avoid dairy products (especially milk & ice cream) for a short time.  The intestines often can lose the ability to digest lactose when stressed. o Avoid foods that cause gassiness or bloating.  Typical foods include beans and other legumes, cabbage, broccoli, and dairy foods.  Every person has some sensitivity to other foods, so listen to our body and avoid those foods that trigger problems for you. o Adding fiber (Citrucel, Metamucil, psyllium, Miralax) gradually can help thicken stools by absorbing excess fluid and retrain the intestines to act more normally.  Slowly increase the dose over a few weeks.  Too much fiber too soon can backfire and cause cramping & bloating. o Probiotics (such as active yogurt, Align, etc) may help repopulate the intestines and colon with normal bacteria and calm down a sensitive digestive tract.  Most studies show it to be of mild help, though, and such products can be costly. o Medicines:   Bismuth subsalicylate (ex. Kayopectate, Pepto Bismol) every 30 minutes for up to 6 doses can help control diarrhea.  Avoid if pregnant.   Loperamide (Immodium) can slow down diarrhea.  Start with two tablets (4mg  total) first and then try one tablet every 6 hours.  Avoid if you are having fevers or severe pain.  If you are not better or start feeling worse, stop all medicines and call your doctor for advice o Call your doctor if you are getting worse or not better.  Sometimes further testing (cultures, endoscopy, X-ray studies, bloodwork, etc) may be needed to help diagnose and treat the cause of the diarrhea. o
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
## 2014-01-25 ENCOUNTER — Ambulatory Visit: Payer: Medicare HMO | Admitting: Cardiology

## 2014-01-25 ENCOUNTER — Telehealth: Payer: Self-pay | Admitting: *Deleted

## 2014-01-25 NOTE — Telephone Encounter (Signed)
In basket message- to [employer] MA. .Billy Parker did not show up for his appointment with [facility] today.  He still has not been cleared

## 2014-02-04 ENCOUNTER — Ambulatory Visit: Payer: Medicare HMO | Admitting: Cardiology

## 2014-03-04 ENCOUNTER — Telehealth (INDEPENDENT_AMBULATORY_CARE_PROVIDER_SITE_OTHER): Payer: Self-pay

## 2014-03-04 NOTE — Telephone Encounter (Signed)
Called pt to check on the status of him no showing twice now with the cardiologist to get cardiac clearance so he can get his hernia repaired by Dr Johney Maine. The pt states his phone was stollen and he just got his phone back today. The pt was rambling on about his insurance going to pay for everything that he had good insurance. I advised pt that he needed to get set up with cardiology again so we can get him cleared for surgery b/c I advised pt that he would not be able to schedule without cardiac clearance. The pt said he is going to see his doctor on 3/10 but I couldn't understand who he said he was going to see. The pt said he would see cardiology after he saw his doctor first. I advised pt that he needed to call me once he wants me to r/s the cardiology appt for cardiac clearance. I am not going to do anything until I hear back from him. The pt understands.

## 2014-03-05 ENCOUNTER — Telehealth (INDEPENDENT_AMBULATORY_CARE_PROVIDER_SITE_OTHER): Payer: Self-pay | Admitting: Surgery

## 2014-03-05 NOTE — Telephone Encounter (Signed)
We have called to try and set up cardiology.  He has missed the cardiologist's office appts 2 times.

## 2014-03-05 NOTE — Telephone Encounter (Signed)
Pt called in sx scheduling when is my surgery.  Per OV notes needs CC / told pt need CC stated i dont know anything about no cardiologist

## 2014-03-08 ENCOUNTER — Ambulatory Visit: Payer: Medicare HMO | Admitting: Cardiology

## 2014-03-10 ENCOUNTER — Encounter (HOSPITAL_COMMUNITY): Payer: Self-pay | Admitting: Emergency Medicine

## 2014-03-10 ENCOUNTER — Emergency Department (HOSPITAL_COMMUNITY)
Admission: EM | Admit: 2014-03-10 | Discharge: 2014-03-10 | Disposition: A | Discharge status: Home / Self Care | Payer: Medicare HMO | Attending: Emergency Medicine | Admitting: Emergency Medicine

## 2014-03-10 DIAGNOSIS — G8929 Other chronic pain: Secondary | ICD-10-CM | POA: Insufficient documentation

## 2014-03-10 DIAGNOSIS — Z8739 Personal history of other diseases of the musculoskeletal system and connective tissue: Secondary | ICD-10-CM | POA: Insufficient documentation

## 2014-03-10 DIAGNOSIS — I5021 Acute systolic (congestive) heart failure: Secondary | ICD-10-CM | POA: Insufficient documentation

## 2014-03-10 DIAGNOSIS — K4090 Unilateral inguinal hernia, without obstruction or gangrene, not specified as recurrent: Secondary | ICD-10-CM | POA: Diagnosis present

## 2014-03-10 DIAGNOSIS — I2510 Atherosclerotic heart disease of native coronary artery without angina pectoris: Secondary | ICD-10-CM | POA: Diagnosis not present

## 2014-03-10 DIAGNOSIS — I10 Essential (primary) hypertension: Secondary | ICD-10-CM | POA: Diagnosis not present

## 2014-03-10 DIAGNOSIS — Z792 Long term (current) use of antibiotics: Secondary | ICD-10-CM | POA: Insufficient documentation

## 2014-03-10 DIAGNOSIS — Z79899 Other long term (current) drug therapy: Secondary | ICD-10-CM | POA: Diagnosis not present

## 2014-03-10 NOTE — Discharge Instructions (Signed)

## 2014-03-10 NOTE — ED Notes (Signed)
Pt c/o right inguinal hernia pain. sts he was dx with it in January, was told to follow up with a doctor but hasn't been able to get into the office, sts he made an appointment but then wrote down the wrong date so he is trying to get in again but can't go for another 2 weeks. Pt sts he wants it checked out before then. Denies testicular pain. Nad, skin warm and dry, resp e/u.
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
## 2014-03-10 NOTE — ED Notes (Addendum)
Pt reports having a hernia located right inguinal which causes occasional discomfort (currently not causing any pain), and c/o abdominal pain.  States abd pain started in January. Pt states pain is intermittent with differing quality (at times sharp, others dull). Currently rates pain 2/10.  Pt reports hx of respiratory problems associated with CHF.  Pt denies n/v/d. No acute distress noted.  Respirations equal and unlabored.  Skin warm and dry.

## 2014-03-16 ENCOUNTER — Ambulatory Visit: Payer: Medicare HMO | Admitting: Cardiology

## 2014-03-24 ENCOUNTER — Ambulatory Visit: Payer: Medicare HMO | Admitting: Cardiology

## 2014-03-24 ENCOUNTER — Ambulatory Visit (INDEPENDENT_AMBULATORY_CARE_PROVIDER_SITE_OTHER): Payer: Medicare HMO | Admitting: Cardiology

## 2014-03-24 VITALS — BP 142/98 | HR 99 | Ht 66.0 in | Wt 158.4 lb

## 2014-03-24 DIAGNOSIS — I2589 Other forms of chronic ischemic heart disease: Secondary | ICD-10-CM

## 2014-03-24 DIAGNOSIS — I059 Rheumatic mitral valve disease, unspecified: Secondary | ICD-10-CM

## 2014-03-24 DIAGNOSIS — I5022 Chronic systolic (congestive) heart failure: Secondary | ICD-10-CM

## 2014-03-24 DIAGNOSIS — I255 Ischemic cardiomyopathy: Secondary | ICD-10-CM

## 2014-03-24 DIAGNOSIS — I340 Nonrheumatic mitral (valve) insufficiency: Secondary | ICD-10-CM

## 2014-03-24 MED ORDER — LISINOPRIL 10 MG PO TABS: 10.0000 mg | ORAL_TABLET | Freq: Every day | ORAL | Status: DC

## 2014-03-24 MED ORDER — FUROSEMIDE 40 MG PO TABS: 40.0000 mg | ORAL_TABLET | Freq: Two times a day (BID) | ORAL | Status: DC

## 2014-03-24 MED ORDER — CARVEDILOL 12.5 MG PO TABS: 12.5000 mg | ORAL_TABLET | Freq: Two times a day (BID) | ORAL | Status: DC

## 2014-03-24 NOTE — Patient Instructions (Signed)
Increase Lisinopril to 10 mg daily Increase Coreg to 12.5 mg twice a day Continue taking all other medications a prescribed.  Our office will call to let you know if it is ok for you to undergo surgery. Will also contact Dr. Johney Maine' office to notify him as well.

## 2014-03-25 ENCOUNTER — Encounter: Payer: Self-pay | Admitting: Cardiology

## 2014-03-25 NOTE — Progress Notes (Addendum)
Patient ID: Billy Parker, male   DOB: 04-24-1936, 78 y.o.   MRN: [id number]    03/25/2014 Billy Parker   11-13-1936  [id number]  Primary Physicia Philis Fendt, MD Primary Cardiologist: Dr. Ellyn Hack   HPI:  [name] is a 78 y.o. AA male with a past medical history of chronic systolic heart failure, nonischemic cardiomyopathy by cath in July 2014 with normal coronaries, EF of 25-30% and history of  cocaine use, who presents to clinic for evaluation for preoperative clearance, before undergoing a right inguinal repair by Dr. Johney Maine. He was hospitalized Jan. 2015 for acute on chronic CHF exacerbation.  Repeat echo at that time demonstrated slight improvement in LV systolic function, however it remained severely reduced at 30-35%. Moderate mitral regurgitation was also noted. He has been turned down for consideration for an ICD due to his cocaine use. He tested positive for cocaine on urine drug screens on his last two hospitalizations, both in Jan and in July 2014. He has been treated medically with beta blocker, ace inhibitor and diuretic therapy. Unfortunately, he failed to show up for his post-hospital office visit and thus has had no titration in his HF meds since discharge.   He presents today stating that he has been doing well. He denies CP, SOB, orthopnea, PND and LEE. He denies further drug use. He reports daily compliance with his medications.     Current Outpatient Prescriptions  Medication Sig Dispense Refill  . albuterol (PROVENTIL HFA;VENTOLIN HFA) 108 (90 BASE) MCG/ACT inhaler Inhale into the lungs every 4 (four) hours as needed for wheezing or shortness of breath.      . carvedilol (COREG) 12.5 MG tablet Take 1 tablet (12.5 mg total) by mouth 2 (two) times daily with a meal.  60 tablet  5  . erythromycin ophthalmic ointment 1 application at bedtime.      . furosemide (LASIX) 40 MG tablet Take 1 tablet (40 mg total) by mouth 2 (two) times daily.  30 tablet  0  .  hydrochlorothiazide (HYDRODIURIL) 12.5 MG tablet Take 12.5 mg by mouth 2 (two) times daily.      Marland Kitchen lisinopril (PRINIVIL,ZESTRIL) 10 MG tablet Take 1 tablet (10 mg total) by mouth daily.  30 tablet  5   No current facility-administered medications for this visit.    No Known Allergies  History   Social History  . Marital Status: Divorced    Spouse Name: N/A    Number of Children: N/A  . Years of Education: N/A   Occupational History  . Not on file.   Social History Main Topics  . Smoking status: Never Smoker   . Smokeless tobacco: Never Used  . Alcohol Use: 6.0 oz/week    10 Cans of beer per week     Comment: 07/06/2013 "I drink 2, 40oz beers at least 3X/wk"  . Drug Use: No     Comment: 07/06/2013 "last used crack 07/02/2013"  . Sexual Activity: Not Currently   Other Topics Concern  . Not on file   Social History Narrative  . No narrative on file     Review of Systems: General: negative for chills, fever, night sweats or weight changes.  Cardiovascular: negative for chest pain, dyspnea on exertion, edema, orthopnea, palpitations, paroxysmal nocturnal dyspnea or shortness of breath Dermatological: negative for rash Respiratory: negative for cough or wheezing Urologic: negative for hematuria Abdominal: negative for nausea, vomiting, diarrhea, bright red blood per rectum, melena, or hematemesis Neurologic: negative for visual changes, syncope, or  dizziness All other systems reviewed and are otherwise negative except as noted above.    Blood pressure 142/98, pulse 99, height 5\' 6"  (1.676 m), weight 158 lb 6.4 oz (71.85 kg).  General appearance: alert, cooperative and no distress Neck: no carotid bruit and no JVD Lungs: clear to auscultation bilaterally Heart: regular rate and rhythm and 2/3 murmur best heard at the apex Abdomen: soft, non-tender; bowel sounds normal; no masses,  no organomegaly and + radiation of heart murmur in LUQ Extremities: no LEE Pulses: 2+ and  symmetric Skin: warm and dry  Neurologic: Grossly normal  EKG EKG w/ occasional PVCs. HR 99 bpm.  LHC 07/06/2013 Coronary Anatomy:  Left Main: Large-caliber, short vessel that trifurcates into a large LAD and Circumflex as well as a small caliber Ramus Intermedius. LAD: Large caliber, very tortuous vessel that gives off a small proximal diagonal followed by a moderate to large caliber major second diagonal branch that covers a large portion of the anterolateral wall. Antegrade with normal. The LAD the tortuous route as it reaches down to and wraps around the apex, perfusing the distal inferoapex. Angiographically normal.  D1: Small-caliber, angiographically normal..  D2: Moderate large-caliber vessel that bifurcates into 2 moderate caliber branches. Angiographically normal. Left Circumflex: Large-caliber vessel which takes a acute angle takeoff from the left main. Then has a hairpin turn as it goes into the AV groove. There is a small first of his marginal before the stent. After the hairpin turn it bifurcates into a large caliber lateral OM that reaches down the inferior lateral wall to the apex and moderate to large caliber AV groove circumflex that trifurcates distally into a 3 small posterolateral branches. Angiographically normal.  Ramus intermedius: Small to moderate caliber vessel that covers a small portion of the inferolateral wall. Angiographically normal. 10  RCA: Large caliber, dominant vessel that terminates as the RPDA. Several RV marginal branches are noted. There is no occlusive disease. No evidence of any significant stenosis to explain the patient's inferior infarct.   2D echo 01/04/14 Study Conclusions  - Left ventricle: The cavity size was normal. Wall thickness was increased in a pattern of moderate LVH. Systolic function was moderately to severely reduced. The estimated ejection fraction was in the range of 30% to 35%. Global hypokinesis, with more predominant  inferolateral hypokinesis. LV diastolic function cannot be assessed due to a-fib. The E/e' ratio, however, is >15, suggesting markedly elevated LV filling pressure. - Mitral valve: Mildly thickened leaflets. Theanterior leaflet is sclerotic. Moderate regurgitation, posteriorly directed. - Left atrium: The atrium was normal in size. - Right ventricle: The cavity size was mildly dilated. - Atrial septum: No defect or patent foramen ovale was identified. - Inferior vena cava: The vessel was normal in size; the respirophasic diameter changes were in the normal range (= 50%); findings are consistent with normal central venous pressure. - Pericardium, extracardiac: There was no pericardial effusion.   ASSESSMENT AND PLAN:   Cardiomyopathy, non ischemic, EF 30-35% Denies symptoms of decompensated HF. He is euvolemic on physical exam, w/o pulmonic rales, JVD and LEE. He reports compliance with medications. He is on low doses of his BB and ACE-I. BP today is 142/98 and HR is 99 bpm. Will increase his Coreg to 12.5 mg BID and his Lisinopril to 10 mg daily. Continue daily Lasix, low sodium diet and daily weights.   Mitral regurgitation- moderate Asymptomatic. Pt denies signs/ symptoms of HF. No resting dyspnea or DOE.     PLAN  LHC  less than 1 year ago revealed angiographically normal coronaries. He has moderate-severe LVF with an EF of 30-35%, on last assessment in Jan of this year, and asymptomatic mitral regurgitation. He he has been on BB and ACE-I therapy, however since he has failed multiple times to follow-up regarding his HF, he has not had any titration in his HF meds. I have opted to increase today, as both BP and HR allow for further titration. ? if re-evaluation of systolic function is needed before determining overall surgical risk. Will consult with Dr. Ellyn Hack, his primary cardiologist. Will notify Dr. Johney Maine once risk assessment is complete.   Alyxandria Wentz,  BRITTAINYPA-C 03/25/2014 2:16 PM  PREOPERATIVE CARDIAC RISK ASSESSMENT  Revised Cardiac Risk Index: High Risk Surgery: no; Infrainguinal Defined as Intraperitoneal, intrathoracic or suprainguinal vascular  Active CAD: no;  CHF: =YES; EF ~30-35% (no active Sx)  Cerebrovascular Disease: no;  Diabetes: no;  On Insulin: no  CKD (Cr >~ 2): no; (1.6-1.7)   Total: 1 Estimated Risk of Adverse Outcome: Low-Intermediate Estimated Risk of MI, PE, VF/VT (Cardiac Arrest), Complete Heart Block: ~3-6 %, reduced with stable BB dose.  Based on low calculated risk, using the Preoperative Cardiac Risk Assessment Scale, Mr. Band has been cleared to undergo right inguinal repair.   Mason Burleigh [name] 04/12/2014 11:05 AM

## 2014-03-25 NOTE — Assessment & Plan Note (Signed)
Asymptomatic. Pt denies signs/ symptoms of HF. No resting dyspnea or DOE.

## 2014-03-25 NOTE — Assessment & Plan Note (Signed)
Denies symptoms of decompensated HF. He is euvolemic on physical exam, w/o pulmonic rales, JVD and LEE. He reports compliance with medications. He is on low doses of his BB and ACE-I. BP today is 142/98 and HR is 99 bpm. Will increase his Coreg to 12.5 mg BID and his Lisinopril to 10 mg daily. Continue daily Lasix, low sodium diet and daily weights.

## 2014-03-26 ENCOUNTER — Other Ambulatory Visit: Payer: Self-pay | Admitting: *Deleted

## 2014-03-29 ENCOUNTER — Ambulatory Visit: Payer: Medicare HMO | Admitting: Cardiology

## 2014-04-06 ENCOUNTER — Telehealth: Payer: Self-pay | Admitting: Cardiology

## 2014-04-06 NOTE — Telephone Encounter (Signed)
Returned call.  Left message that message received and being forwarded to [facility], [name] as no documentation of clearance noted.  Also to call back before 4pm if questions.  Message forwarded to Lyda Jester, [name]/JC, LPN.

## 2014-04-06 NOTE — Telephone Encounter (Signed)
Pr wants to know if [place] have given the okay for his surgery with Dr Harrington Challenger at [facility]?

## 2014-04-07 NOTE — Telephone Encounter (Signed)
Returning call .Marland Kitchen Please call at this number [phone]

## 2014-04-08 NOTE — Telephone Encounter (Signed)
Message forwarded to Dr. Sharlyn Bologna, RN.

## 2014-04-12 NOTE — Telephone Encounter (Signed)
I added the follow addendum to my original office note. He has been cleared for surgery. I routed the addended note to Dr. Michael Boston. Please notify patient as well.   Addedum: PREOPERATIVE CARDIAC RISK ASSESSMENT  Revised Cardiac Risk Index: High Risk Surgery: no; Infrainguinal Defined as Intraperitoneal, intrathoracic or suprainguinal vascular  Active CAD: no;  CHF: =YES; EF ~30-35% (no active Sx)  Cerebrovascular Disease: no;  Diabetes: no;  On Insulin: no  CKD (Cr >~ 2): no; (1.6-1.7)   Total: 1 Estimated Risk of Adverse Outcome: Low-Intermediate Estimated Risk of MI, PE, VF/VT (Cardiac Arrest), Complete Heart Block: ~3-6 %, reduced with stable BB dose.  Based on low calculated risk, using the Preoperative Cardiac Risk Assessment Scale, Billy Parker has been cleared to undergo right inguinal repair.   SIMMONS, BRITTAINY [name] 04/12/2014 11:05 AM

## 2014-04-12 NOTE — Telephone Encounter (Signed)
Returned call.  Left message on pt-identified VM that he has been cleared for surgery and to contact his doctor.  Call back tomorrow before 4pm if questions.

## 2014-04-12 NOTE — Telephone Encounter (Signed)
This looks good.  Accurate. I agree based upon the clinic note.  Leonie Man, MD

## 2014-04-13 ENCOUNTER — Other Ambulatory Visit (INDEPENDENT_AMBULATORY_CARE_PROVIDER_SITE_OTHER): Payer: Self-pay | Admitting: Surgery

## 2014-04-20 ENCOUNTER — Emergency Department (HOSPITAL_COMMUNITY): Payer: Medicare HMO

## 2014-04-20 ENCOUNTER — Encounter (HOSPITAL_COMMUNITY): Payer: Self-pay | Admitting: Emergency Medicine

## 2014-04-20 ENCOUNTER — Emergency Department (HOSPITAL_COMMUNITY)
Admission: EM | Admit: 2014-04-20 | Discharge: 2014-04-20 | Disposition: A | Discharge status: Home / Self Care | Payer: Medicare HMO | Attending: Emergency Medicine | Admitting: Emergency Medicine

## 2014-04-20 DIAGNOSIS — M19039 Primary osteoarthritis, unspecified wrist: Secondary | ICD-10-CM | POA: Insufficient documentation

## 2014-04-20 DIAGNOSIS — I2510 Atherosclerotic heart disease of native coronary artery without angina pectoris: Secondary | ICD-10-CM | POA: Insufficient documentation

## 2014-04-20 DIAGNOSIS — R112 Nausea with vomiting, unspecified: Secondary | ICD-10-CM | POA: Insufficient documentation

## 2014-04-20 DIAGNOSIS — R0989 Other specified symptoms and signs involving the circulatory and respiratory systems: Principal | ICD-10-CM | POA: Insufficient documentation

## 2014-04-20 DIAGNOSIS — M129 Arthropathy, unspecified: Secondary | ICD-10-CM | POA: Insufficient documentation

## 2014-04-20 DIAGNOSIS — R0609 Other forms of dyspnea: Secondary | ICD-10-CM | POA: Insufficient documentation

## 2014-04-20 DIAGNOSIS — F1410 Cocaine abuse, uncomplicated: Secondary | ICD-10-CM | POA: Insufficient documentation

## 2014-04-20 DIAGNOSIS — Z79899 Other long term (current) drug therapy: Secondary | ICD-10-CM | POA: Insufficient documentation

## 2014-04-20 DIAGNOSIS — G8929 Other chronic pain: Secondary | ICD-10-CM | POA: Insufficient documentation

## 2014-04-20 DIAGNOSIS — N390 Urinary tract infection, site not specified: Secondary | ICD-10-CM | POA: Insufficient documentation

## 2014-04-20 DIAGNOSIS — R0600 Dyspnea, unspecified: Secondary | ICD-10-CM

## 2014-04-20 DIAGNOSIS — I5021 Acute systolic (congestive) heart failure: Secondary | ICD-10-CM | POA: Insufficient documentation

## 2014-04-20 DIAGNOSIS — R0789 Other chest pain: Secondary | ICD-10-CM | POA: Insufficient documentation

## 2014-04-20 DIAGNOSIS — I10 Essential (primary) hypertension: Secondary | ICD-10-CM | POA: Insufficient documentation

## 2014-04-20 LAB — BASIC METABOLIC PANEL
BUN: 19 mg/dL (ref 6–23)
CO2: 21 mEq/L (ref 19–32)
Calcium: 8.6 mg/dL (ref 8.4–10.5)
Chloride: 104 mEq/L (ref 96–112)
Creatinine, Ser: 1.24 mg/dL (ref 0.50–1.35)
GFR calc Af Amer: 64 mL/min — ABNORMAL LOW (ref >90)
GFR calc non Af Amer: 55 mL/min — ABNORMAL LOW (ref >90)
Glucose, Bld: 101 mg/dL — ABNORMAL HIGH (ref 70–99)
Potassium: 3.7 mEq/L (ref 3.7–5.3)
Sodium: 142 mEq/L (ref 137–147)

## 2014-04-20 LAB — URINALYSIS, ROUTINE W REFLEX MICROSCOPIC
Bilirubin Urine: NEGATIVE
Glucose, UA: NEGATIVE mg/dL
Ketones, ur: NEGATIVE mg/dL
Nitrite: POSITIVE — AB
Protein, ur: >300 mg/dL — AB
Specific Gravity, Urine: 1.023 (ref 1.005–1.030)
Urobilinogen, UA: 1 mg/dL (ref 0.0–1.0)
pH: 5.5 (ref 5.0–8.0)

## 2014-04-20 LAB — CBC WITH DIFFERENTIAL/PLATELET
Basophils Absolute: 0 10*3/uL (ref 0.0–0.1)
Basophils Relative: 0 % (ref 0–1)
Eosinophils Absolute: 0.1 10*3/uL (ref 0.0–0.7)
Eosinophils Relative: 2 % (ref 0–5)
HCT: 42 % (ref 39.0–52.0)
Hemoglobin: 14.3 g/dL (ref 13.0–17.0)
Lymphocytes Relative: 26 % (ref 12–46)
Lymphs Abs: 1.6 10*3/uL (ref 0.7–4.0)
MCH: 30.2 pg (ref 26.0–34.0)
MCHC: 34 g/dL (ref 30.0–36.0)
MCV: 88.8 fL (ref 78.0–100.0)
Monocytes Absolute: 0.4 10*3/uL (ref 0.1–1.0)
Monocytes Relative: 7 % (ref 3–12)
Neutro Abs: 4 10*3/uL (ref 1.7–7.7)
Neutrophils Relative %: 65 % (ref 43–77)
Platelets: 160 10*3/uL (ref 150–400)
RBC: 4.73 MIL/uL (ref 4.22–5.81)
RDW: 14.2 % (ref 11.5–15.5)
WBC: 6.2 10*3/uL (ref 4.0–10.5)

## 2014-04-20 LAB — TROPONIN I: Troponin I: <0.3 ng/mL (ref <0.30)

## 2014-04-20 LAB — URINE MICROSCOPIC-ADD ON

## 2014-04-20 LAB — RAPID URINE DRUG SCREEN, HOSP PERFORMED
Amphetamines: NOT DETECTED
Barbiturates: NOT DETECTED
Benzodiazepines: NOT DETECTED
Cocaine: POSITIVE — AB
Opiates: NOT DETECTED
Tetrahydrocannabinol: POSITIVE — AB

## 2014-04-20 LAB — PRO B NATRIURETIC PEPTIDE: Pro B Natriuretic peptide (BNP): 15684 pg/mL — ABNORMAL HIGH (ref 0–450)

## 2014-04-20 MED ORDER — CEPHALEXIN 500 MG PO CAPS: 500.0000 mg | ORAL_CAPSULE | Freq: Four times a day (QID) | ORAL | Status: DC

## 2014-04-20 MED ORDER — ONDANSETRON HCL 4 MG/2ML IJ SOLN
4.0000 mg | Freq: Once | INTRAMUSCULAR | Status: AC
  Administered 2014-04-20: 4 mg via INTRAVENOUS
  Filled 2014-04-20: qty 2

## 2014-04-20 MED ORDER — DEXTROSE 5 % IV SOLN
1.0000 g | Freq: Once | INTRAVENOUS | Status: AC
  Administered 2014-04-20: 1 g via INTRAVENOUS

## 2014-04-20 MED ORDER — CEFTRIAXONE SODIUM 1 G IJ SOLR
1.0000 g | INTRAMUSCULAR | Status: DC
  Filled 2014-04-20: qty 10

## 2014-04-20 NOTE — ED Notes (Signed)
Pt instructed to use call bell if he has needs or worsening chest pain.

## 2014-04-20 NOTE — ED Notes (Signed)
Patient transported to X-ray 

## 2014-04-20 NOTE — ED Notes (Signed)
Phleb at bedside; pt still reports feeling "much better".

## 2014-04-20 NOTE — ED Notes (Signed)
Case manager called; pt to be discharged and moved to POD C for case management to talk to him.

## 2014-04-20 NOTE — ED Notes (Signed)
PT ambulated with baseline gait; VSS; A&Ox3; no signs of distress; respirations even and unlabored; skin warm and dry; no questions upon discharge.  

## 2014-04-20 NOTE — ED Notes (Signed)
MD at bedside. 

## 2014-04-20 NOTE — ED Notes (Signed)
Pt given urinal and asked to provide sample. 

## 2014-04-20 NOTE — Discharge Instructions (Signed)
Polysubstance Abuse When people abuse more than one drug or type of drug it is called polysubstance or polydrug abuse. For example, many smokers also drink alcohol. This is one form of polydrug abuse. Polydrug abuse also refers to the use of a drug to counteract an unpleasant effect produced by another drug. It may also be used to help with withdrawal from another drug. People who take stimulants may become agitated. Sometimes this agitation is countered with a tranquilizer. This helps protect against the unpleasant side effects. Polydrug abuse also refers to the use of different drugs at the same time.  Anytime drug use is interfering with normal living activities, it has become abuse. This includes problems with family and friends. Psychological dependence has developed when your mind tells you that the drug is needed. This is usually followed by physical dependence which has developed when continuing increases of drug are required to get the same feeling or "high". This is known as addiction or chemical dependency. A person's risk is much higher if there is a history of chemical dependency in the family. SIGNS OF CHEMICAL DEPENDENCY  You have been told by friends or family that drugs have become a problem.  You fight when using drugs.  You are having blackouts (not remembering what you do while using).  You feel sick from using drugs but continue using.  You lie about use or amounts of drugs (chemicals) used.  You need chemicals to get you going.  You are suffering in work performance or in school because of drug use.  You get sick from use of drugs but continue to use anyway.  You need drugs to relate to people or feel comfortable in social situations.  You use drugs to forget problems. "Yes" answered to any of the above signs of chemical dependency indicates there are problems. The longer the use of drugs continues, the greater the problems will become. If there is a family history of  drug or alcohol use, it is best not to experiment with these drugs. Continual use leads to tolerance. After tolerance develops more of the drug is needed to get the same feeling. This is followed by addiction. With addiction, drugs become the most important part of life. It becomes more important to take drugs than participate in the other usual activities of life. This includes relating to friends and family. Addiction is followed by dependency. Dependency is a condition where drugs are now needed not just to get high, but to feel normal. Addiction cannot be cured but it can be stopped. This often requires outside help and the care of professionals. Treatment centers are listed in the yellow pages under: Cocaine, Narcotics, and Alcoholics Anonymous. Most hospitals and clinics can refer you to a specialized care center. Talk to your caregiver if you need help. Document Released: 08/08/2005 Document Revised: 03/10/2012 Document Reviewed: 12/17/2005 [facility] Patient Information 2014 [place].  Shortness of Breath Shortness of breath means you have trouble breathing. Shortness of breath needs medical care right away. HOME CARE   Do not smoke.  Avoid being around chemicals or things (paint fumes, dust) that may bother your breathing.  Rest as needed. Slowly begin your normal activities.  Only take medicines as told by your doctor.  Keep all doctor visits as told. GET HELP RIGHT AWAY IF:   Your shortness of breath gets worse.  You feel lightheaded, pass out (faint), or have a cough that is not helped by medicine.  You cough up blood.  You have pain with breathing.  You have pain in your chest, arms, shoulders, or belly (abdomen).  You have a fever.  You cannot walk up stairs or exercise the way you normally do.  You do not get better in the time expected.  You have a hard time doing normal activities even with rest.  You have problems with your medicines.  You have any new  symptoms. MAKE SURE YOU:  Understand these instructions.  Will watch your condition.  Will get help right away if you are not doing well or get worse. Document Released: 06/04/2008 Document Revised: 06/17/2012 Document Reviewed: 03/03/2012 [facility] Patient Information 2014 [place], [place].  Urinary Tract Infection Urinary tract infections (UTIs) can develop anywhere along your urinary tract. Your urinary tract is your body's drainage system for removing wastes and extra water. Your urinary tract includes two kidneys, two ureters, a bladder, and a urethra. Your kidneys are a pair of bean-shaped organs. Each kidney is about the size of your fist. They are located below your ribs, one on each side of your spine. CAUSES Infections are caused by microbes, which are microscopic organisms, including fungi, viruses, and bacteria. These organisms are so small that they can only be seen through a microscope. Bacteria are the microbes that most commonly cause UTIs. SYMPTOMS  Symptoms of UTIs may vary by age and gender of the patient and by the location of the infection. Symptoms in young women typically include a frequent and intense urge to urinate and a painful, burning feeling in the bladder or urethra during urination. Older women and men are more likely to be tired, shaky, and weak and have muscle aches and abdominal pain. A fever may mean the infection is in your kidneys. Other symptoms of a kidney infection include pain in your back or sides below the ribs, nausea, and vomiting. DIAGNOSIS To diagnose a UTI, your caregiver will ask you about your symptoms. Your caregiver also will ask to provide a urine sample. The urine sample will be tested for bacteria and white blood cells. White blood cells are made by your body to help fight infection. TREATMENT  Typically, UTIs can be treated with medication. Because most UTIs are caused by a bacterial infection, they usually can be treated with the use of  antibiotics. The choice of antibiotic and length of treatment depend on your symptoms and the type of bacteria causing your infection. HOME CARE INSTRUCTIONS  If you were prescribed antibiotics, take them exactly as your caregiver instructs you. Finish the medication even if you feel better after you have only taken some of the medication.  Drink enough water and fluids to keep your urine clear or pale yellow.  Avoid caffeine, tea, and carbonated beverages. They tend to irritate your bladder.  Empty your bladder often. Avoid holding urine for long periods of time.  Empty your bladder before and after sexual intercourse.  After a bowel movement, women should cleanse from front to back. Use each tissue only once. SEEK MEDICAL CARE IF:   You have back pain.  You develop a fever.  Your symptoms do not begin to resolve within 3 days. SEEK IMMEDIATE MEDICAL CARE IF:   You have severe back pain or lower abdominal pain.  You develop chills.  You have nausea or vomiting.  You have continued burning or discomfort with urination. MAKE SURE YOU:   Understand these instructions.  Will watch your condition.  Will get help right away if you are not doing well  or get worse. Document Released: 09/26/2005 Document Revised: 06/17/2012 Document Reviewed: 01/25/2012 [facility] Patient Information 2014 [place].   Emergency Department Resource Guide 1) Find a Doctor and Pay Out of Pocket Although you won't have to find out who is covered by your insurance plan, it is a good idea to ask around and get recommendations. You will then need to call the office and see if the doctor you have chosen will accept you as a new patient and what types of options they offer for patients who are self-pay. Some doctors offer discounts or will set up payment plans for their patients who do not have insurance, but you will need to ask so you aren't surprised when you get to your appointment.  2) Contact  Your Local Health Department Not all health departments have doctors that can see patients for sick visits, but many do, so it is worth a call to see if yours does. If you don't know where your local health department is, you can check in your phone book. The CDC also has a tool to help you locate your state's health department, and many state websites also have listings of all of their local health departments.  3) Find a [facility] If your illness is not likely to be very severe or complicated, you may want to try a walk in clinic. These are popping up all over the country in pharmacies, drugstores, and shopping centers. They're usually staffed by nurse practitioners or physician assistants that have been trained to treat common illnesses and complaints. They're usually fairly quick and inexpensive. However, if you have serious medical issues or chronic medical problems, these are probably not your best option.  No Primary Care Doctor: - Call Health Connect at  [phone] - they can help you locate a primary care doctor that  accepts your insurance, provides certain services, etc. - Physician Referral Service- [phone]  Chronic Pain Problems: Organization         Address  Phone   Notes  [facility]  [phone] Patients need to be referred by their primary care doctor.   Medication Assistance: Organization         Address  Phone   Notes  [facility] Medication [facility] [address] [place], [place] [address] [phone] --Must be a resident of [facility] -- Must have NO insurance coverage whatsoever (no Medicaid/ Medicare, etc.) -- The pt. MUST have a primary care doctor that directs their care regularly and follows them in the community   MedAssist  [phone]   [employer]  [phone]    Agencies that provide inexpensive medical care: Organization         Address  Phone   Notes  [place]  [phone]   [facility]    [phone]   [facility] [place],  [address] [phone]   [place] [address], [place] [phone]   Planned Parenthood    [phone]   [facility]    [phone]   [place] and [address], [place] Phone:  [phone], Fax:  [phone] Hours of Operation:  9 am - 6 pm, M-F.  Also accepts Medicaid/Medicare and self-pay.  [facility] for [place], [address], [place] Phone: [phone], Fax: [phone].  Hours of Operation:  8:30 am - 5:30 pm, M-F.  Also accepts Medicaid and self-pay.  [facility] [place] [address] [place] Phone: [phone]   [place], [place], [place] [phone], Ext. [phone] Mondays & Thursdays: 7-9 AM.  First 15 patients are seen on a first come, first serve basis.    [place] Providers:  Organization         Address  Phone   Notes  [facility] [address], [place] [phone] Also accepts self-pay patients.  [facility] [address], [place]  [phone]   [place], [address], [place] [phone]   [facility] [address], [place] [phone]   Lucianne Lei [address], [place]   [phone] Only accepts [place] Access [place] patients after they have their name applied to their card.   Self-Pay (no insurance) in [facility]:  Organization         Address  Phone   Notes  Sickle Cell Patients, [facility] [place] [phone]   [facility] Urgent Care [place] [phone]   [facility] [place]  [place],  [place], [place] [phone]   [facility]/Dr. Osei-Bonsu  [address] [place] or [address], [place] [phone] Phone number for both [place] and [place] locations is the same.  Urgent Medical and [facility] [address] [place] [phone]   [facility] [address] [place] or [address] [phone]   [facility] [address] [place] [phone], phone; [phone], fax Sees patients 1st and 3rd Saturday of every month.  Must not qualify for public or private insurance (i.e. Medicaid, Medicare, [place] Health Choice, Veterans' Benefits)  Household income should be no more than 200% of the poverty level The clinic cannot treat you if you are pregnant or think you are pregnant  Sexually transmitted diseases are not treated at the clinic.    Dental Care: Organization         Address  Phone  Notes  [facility] Department of [facility] [place] [phone] Accepts children up to age 45 who are enrolled in [place] or [place]; pregnant women with a Medicaid card; and children who have applied for Medicaid or [place] Health Choice, but were declined, whose parents can pay a reduced fee at time of service.  [facility] Department of [facility]  [address], [place] [phone] Accepts children up to age 19 who are enrolled in [place] or [place]; pregnant women with a Medicaid card; and children who have applied for Medicaid or [place] Health Choice, but were declined, whose parents can pay a reduced fee at time of service.  [place] Adult [facility] Access PROGRAM  [place] [phone] Patients are seen by appointment only. Walk-ins are not accepted. [place] will see patients 8 years of age and older. Monday - Tuesday (8am-5pm) Most  Wednesdays (8:30-5pm) $30 per visit, cash only  [facility] Adult [employer] PROGRAM  [address], Outpatient Carecenter [phone] Patients  are seen by appointment only. Walk-ins are not accepted. [place] will see patients 42 years of age and older. One Wednesday Evening (Monthly: Volunteer Based).  $30 per visit, cash only  [place]  [phone] for adults; Children under age 55, call Graduate Pediatric Dentistry at [phone]. Children aged 26-14, please call [phone] to request a pediatric application.  Dental services are provided in all areas of dental care including fillings, crowns and bridges, complete and partial dentures, implants, gum treatment, root canals, and extractions. Preventive care is also provided. Treatment is provided to both adults and children. Patients are selected via a lottery and there is often a waiting list.   [facility] [address], [place]  [phone] [email or web address]   [facility] [address] [place], [place] [phone], Ext. [phone] Second and Fourth Thursday of each month, opens at 6:30 AM; Clinic ends at 9 AM.  Patients are seen on a first-come first-served basis, and a limited number are seen during each clinic.   [facility]  [address] Hillard Danker [place], [place] [phone]   Eligibility Requirements You must have lived in [place], [place], or [place] counties for at least the last three months.   You cannot be eligible for state or federal sponsored [employer], including [employer], [place], or [employer].   You generally cannot be eligible for healthcare insurance through your employer.    How to apply: Eligibility screenings are held every Tuesday and Wednesday afternoon from 1:00 pm until 4:00 pm. You do not need an appointment for the interview!  [facility] [address], [place], [place]     [place]  [place] Department  [place]  [phone]    Behavioral Health Resources in the Community: Intensive Outpatient Programs Organization         Address  Phone  Notes  [place]. [address], [place], [place] [phone]   [facility] Outpatient [address], [place], [place]   ADS: Alcohol & Drug Svcs [address] [place], [place]   [place] [address],  [place], [place] or [phone]   Substance Abuse Resources Organization         Address  Phone  Notes  Alcohol and Drug Services  [phone]   [place]  [phone]   [facility] [place]   Chinita Pester  [phone]   Residential & Outpatient Substance Abuse Program  [phone]   Psychological Services Organization         Address  Phone  Notes  [facility] [place]  [place]  [phone]   [place] [address] [place] or [phone]    Mobile Crisis Teams Organization         Address  Phone  Notes  [facility], Mobile Crisis Care Unit  [phone]   [facility]  [address] [place], [place]   [place] Levels [address], [place] [phone]    Self-Help/Support Groups Organization         Address  Phone             Notes  [place]. of [place] - variety of support groups  [place] Call  for more information  Narcotics Anonymous (NA), [facility]aring [facility] [address], [place]  2 meetings at this location   Residential Facilities manager         Address  Phone  Notes  [facility] [place],    [place]  [phone]   [facility]  [address] [place]  [phone], [place], [place]   [place] Yetter, [place] [phone] Admissions: 8am-3pm M-F  Incentives Substance [place] [address]    [place], [place] [phone]   [facility] [address] [place] [address] [place], [place], [place]   [facility] [address],  [place], [place]   [facility] - Intensive Outpatient [address] Kristeen [address], [place], [place]   [facility] ([place] [place].,  [place], [place] [phone] or [phone]   [facility] Services (RTS) [address], [place], [place] Accepts Medicaid  [facility] [place] [address]  [place] [phone] Substance Abuse/Addiction Treatment   [facility] Organization         Address  Phone  Notes  [facility]  [phone]   Domenic Schwab, PhD [address] [place]lis [place], [place]   [phone] or [phone]   [place] Fonda Toyah [place], [place] [phone]   [facility] [address], [place], [place] [phone] Insurance/Medicaid/sponsorship through [facility] and Families [address] [place]                                    [place], [place] [phone] [place] [address] [place], [place] [phone]    Dr. Adele Schilder  [phone]   [facility] Dept. 1) [address], [place] 2) [place] 3)  [place] [address], [place] [phone]  [phone]   [place] [phone] or [phone] (After Hours)

## 2014-04-20 NOTE — ED Notes (Signed)
Pt got on bus this morning and got sick; went home. Chest pressure since last night "elephant sitting on chest" and SOB. 10/10. Aspiring and 2 Nitros gave relief. Pain now 4/10/

## 2014-04-20 NOTE — ED Notes (Signed)
RN unable to pull blood off IV site. Site is flushing well.

## 2014-04-20 NOTE — ED Notes (Signed)
Pt reports he has been out of his meds for several days due to financial reasons.

## 2014-04-20 NOTE — Progress Notes (Signed)
CSW/CM called to speak to pt regarding medication assistance. Pt left without seeing CSW/CM. CSW left message on pt's voice mail. CSW informed attending RN, Pincus Large. No further intervention needed.   [address] [place]

## 2014-04-21 ENCOUNTER — Telehealth: Payer: Self-pay | Admitting: *Deleted

## 2014-04-21 NOTE — Telephone Encounter (Signed)
Returned call and pt verified x 2.  Pt informed message received.  Also informed RN left a message on his VM last week b/c I wasn't able to speak w/ him that he was cleared and this information was sent to Dr. Johney Maine' office.  Advised he contact Dr. Johney Maine' office.  Pt verbalized understanding and agreed w/ plan.

## 2014-04-21 NOTE — Telephone Encounter (Signed)
Pt was calling in regards to his operation. He wants to know if it was ok for him to go through with his surgery. Stated that [place] was supposed to let him know.  BS
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
## 2014-04-26 ENCOUNTER — Telehealth: Payer: Self-pay | Admitting: *Deleted

## 2014-04-26 ENCOUNTER — Encounter (HOSPITAL_COMMUNITY): Payer: Self-pay | Admitting: Emergency Medicine

## 2014-04-26 ENCOUNTER — Inpatient Hospital Stay (HOSPITAL_COMMUNITY)
Admission: EM | Admit: 2014-04-26 | Discharge: 2014-04-29 | DRG: 291 | Disposition: A | Discharge status: Home / Self Care | Payer: Medicare HMO | Attending: Internal Medicine | Admitting: Internal Medicine

## 2014-04-26 ENCOUNTER — Emergency Department (HOSPITAL_COMMUNITY): Payer: Medicare HMO

## 2014-04-26 DIAGNOSIS — I2510 Atherosclerotic heart disease of native coronary artery without angina pectoris: Secondary | ICD-10-CM | POA: Diagnosis present

## 2014-04-26 DIAGNOSIS — I200 Unstable angina: Secondary | ICD-10-CM

## 2014-04-26 DIAGNOSIS — N390 Urinary tract infection, site not specified: Secondary | ICD-10-CM | POA: Diagnosis present

## 2014-04-26 DIAGNOSIS — G8929 Other chronic pain: Secondary | ICD-10-CM | POA: Diagnosis present

## 2014-04-26 DIAGNOSIS — I5022 Chronic systolic (congestive) heart failure: Secondary | ICD-10-CM

## 2014-04-26 DIAGNOSIS — Z79899 Other long term (current) drug therapy: Secondary | ICD-10-CM

## 2014-04-26 DIAGNOSIS — Z9189 Other specified personal risk factors, not elsewhere classified: Secondary | ICD-10-CM

## 2014-04-26 DIAGNOSIS — R0602 Shortness of breath: Secondary | ICD-10-CM | POA: Diagnosis present

## 2014-04-26 DIAGNOSIS — F1210 Cannabis abuse, uncomplicated: Secondary | ICD-10-CM | POA: Diagnosis present

## 2014-04-26 DIAGNOSIS — E876 Hypokalemia: Secondary | ICD-10-CM

## 2014-04-26 DIAGNOSIS — R7989 Other specified abnormal findings of blood chemistry: Secondary | ICD-10-CM

## 2014-04-26 DIAGNOSIS — I10 Essential (primary) hypertension: Secondary | ICD-10-CM | POA: Diagnosis present

## 2014-04-26 DIAGNOSIS — M545 Low back pain, unspecified: Secondary | ICD-10-CM | POA: Diagnosis present

## 2014-04-26 DIAGNOSIS — F1410 Cocaine abuse, uncomplicated: Secondary | ICD-10-CM | POA: Diagnosis present

## 2014-04-26 DIAGNOSIS — I2589 Other forms of chronic ischemic heart disease: Secondary | ICD-10-CM | POA: Diagnosis present

## 2014-04-26 DIAGNOSIS — I255 Ischemic cardiomyopathy: Secondary | ICD-10-CM

## 2014-04-26 DIAGNOSIS — I509 Heart failure, unspecified: Secondary | ICD-10-CM | POA: Diagnosis present

## 2014-04-26 DIAGNOSIS — J9600 Acute respiratory failure, unspecified whether with hypoxia or hypercapnia: Secondary | ICD-10-CM | POA: Diagnosis present

## 2014-04-26 DIAGNOSIS — Z8249 Family history of ischemic heart disease and other diseases of the circulatory system: Secondary | ICD-10-CM | POA: Diagnosis not present

## 2014-04-26 DIAGNOSIS — I5023 Acute on chronic systolic (congestive) heart failure: Principal | ICD-10-CM | POA: Diagnosis present

## 2014-04-26 DIAGNOSIS — I4729 Other ventricular tachycardia: Secondary | ICD-10-CM

## 2014-04-26 DIAGNOSIS — N183 Chronic kidney disease, stage 3 unspecified: Secondary | ICD-10-CM

## 2014-04-26 DIAGNOSIS — K4090 Unilateral inguinal hernia, without obstruction or gangrene, not specified as recurrent: Secondary | ICD-10-CM

## 2014-04-26 DIAGNOSIS — I340 Nonrheumatic mitral (valve) insufficiency: Secondary | ICD-10-CM

## 2014-04-26 DIAGNOSIS — I472 Ventricular tachycardia: Secondary | ICD-10-CM

## 2014-04-26 DIAGNOSIS — R9439 Abnormal result of other cardiovascular function study: Secondary | ICD-10-CM

## 2014-04-26 DIAGNOSIS — I5021 Acute systolic (congestive) heart failure: Secondary | ICD-10-CM

## 2014-04-26 DIAGNOSIS — I493 Ventricular premature depolarization: Secondary | ICD-10-CM

## 2014-04-26 DIAGNOSIS — N289 Disorder of kidney and ureter, unspecified: Secondary | ICD-10-CM

## 2014-04-26 LAB — I-STAT TROPONIN, ED: Troponin i, poc: 0.04 ng/mL (ref 0.00–0.08)

## 2014-04-26 LAB — CBC
HCT: 45.2 % (ref 39.0–52.0)
Hemoglobin: 15.4 g/dL (ref 13.0–17.0)
MCH: 30.1 pg (ref 26.0–34.0)
MCHC: 34.1 g/dL (ref 30.0–36.0)
MCV: 88.5 fL (ref 78.0–100.0)
PLATELETS: 199 10*3/uL (ref 150–400)
RBC: 5.11 MIL/uL (ref 4.22–5.81)
RDW: 14.9 % (ref 11.5–15.5)
WBC: 5.4 10*3/uL (ref 4.0–10.5)

## 2014-04-26 LAB — BASIC METABOLIC PANEL
BUN: 16 mg/dL (ref 6–23)
CALCIUM: 9 mg/dL (ref 8.4–10.5)
CO2: 21 mEq/L (ref 19–32)
Chloride: 103 mEq/L (ref 96–112)
Creatinine, Ser: 1.14 mg/dL (ref 0.50–1.35)
GFR calc Af Amer: 71 mL/min — ABNORMAL LOW (ref >90)
GFR calc non Af Amer: 61 mL/min — ABNORMAL LOW (ref >90)
Glucose, Bld: 92 mg/dL (ref 70–99)
Potassium: 3.8 mEq/L (ref 3.7–5.3)
SODIUM: 140 meq/L (ref 137–147)

## 2014-04-26 LAB — PRO B NATRIURETIC PEPTIDE: Pro B Natriuretic peptide (BNP): 16079 pg/mL — ABNORMAL HIGH (ref 0–450)

## 2014-04-26 MED ORDER — FUROSEMIDE 10 MG/ML IJ SOLN
60.0000 mg | Freq: Once | INTRAMUSCULAR | Status: AC
  Administered 2014-04-26: 60 mg via INTRAVENOUS
  Filled 2014-04-26: qty 6

## 2014-04-26 MED ORDER — SODIUM CHLORIDE 0.9 % IJ SOLN: 3.0000 mL | INTRAMUSCULAR | Status: DC | PRN

## 2014-04-26 MED ORDER — FUROSEMIDE 10 MG/ML IJ SOLN
40.0000 mg | Freq: Two times a day (BID) | INTRAMUSCULAR | Status: DC
  Administered 2014-04-26: 40 mg via INTRAVENOUS
  Filled 2014-04-26 (×3): qty 4

## 2014-04-26 MED ORDER — ENOXAPARIN SODIUM 40 MG/0.4ML ~~LOC~~ SOLN
40.0000 mg | SUBCUTANEOUS | Status: DC
  Administered 2014-04-26 – 2014-04-28 (×3): 40 mg via SUBCUTANEOUS
  Filled 2014-04-26 (×4): qty 0.4

## 2014-04-26 MED ORDER — SODIUM CHLORIDE 0.9 % IJ SOLN
3.0000 mL | Freq: Two times a day (BID) | INTRAMUSCULAR | Status: DC
  Administered 2014-04-26 – 2014-04-28 (×4): 3 mL via INTRAVENOUS

## 2014-04-26 MED ORDER — CEPHALEXIN 500 MG PO CAPS
500.0000 mg | ORAL_CAPSULE | Freq: Four times a day (QID) | ORAL | Status: DC
  Administered 2014-04-26 – 2014-04-27 (×2): 500 mg via ORAL
  Filled 2014-04-26 (×15): qty 1

## 2014-04-26 MED ORDER — HYDRALAZINE HCL 20 MG/ML IJ SOLN: 10.0000 mg | Freq: Four times a day (QID) | INTRAMUSCULAR | Status: DC | PRN

## 2014-04-26 MED ORDER — SODIUM CHLORIDE 0.9 % IV SOLN: 250.0000 mL | INTRAVENOUS | Status: DC | PRN

## 2014-04-26 NOTE — ED Notes (Signed)
Attempted report 

## 2014-04-26 NOTE — ED Notes (Signed)
Pt moved from hallway to room 31; pt undressed, in gown, on monitor, continuous pulse oximetry and blood pressure cuff

## 2014-04-26 NOTE — Telephone Encounter (Signed)
Called to clarify furosemide script.  Informed furosemide is not on pt's current med list and it appears it was dc'd on 3.25.15 as pt stated he hadn't taken it in the last 30 days.  Verbalized understanding.  Stated pt said he was taking it and she wasn't able to find where a script was sent to a pharmacy.
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
## 2014-04-26 NOTE — ED Notes (Signed)
Pt reports mild sob, pt is speaking in complete sentences. Aware of POC. O2 sats 95% on 2L [place].

## 2014-04-26 NOTE — Progress Notes (Signed)
Report given to receiving RN. Patient in bed resting watching TV, with no verbal complaint and no signs or symptoms of distress or discomfort.

## 2014-04-26 NOTE — ED Notes (Signed)
Pt moved to hallway per AD to wait on bed upstairs; pt was updated by AD as well as to POC

## 2014-04-26 NOTE — ED Notes (Signed)
Pt reports SOB worsening over past few days. Also leg swelling since this morning. Reports has been taking lasix. Has cough, with productive sputum. Denies fever. EKG AFIb, BP 170/110, 98% 4 L. Lung sounds diminished.

## 2014-04-26 NOTE — ED Notes (Signed)
Pt ambulated from room 31 to nurses' station and back stating 88% - 91% on RA; Erika, RN notified; pt placed back on monitor, continuous pulse oximetry, blood pressure cuff and oxygen [place] (2L)

## 2014-04-26 NOTE — ED Notes (Signed)
Pt resting, watching tv.
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
## 2014-04-27 LAB — BASIC METABOLIC PANEL
BUN: 17 mg/dL (ref 6–23)
CALCIUM: 9 mg/dL (ref 8.4–10.5)
CO2: 27 meq/L (ref 19–32)
CREATININE: 1.26 mg/dL (ref 0.50–1.35)
Chloride: 99 mEq/L (ref 96–112)
GFR, EST AFRICAN AMERICAN: 63 mL/min — AB (ref >90)
GFR, EST NON AFRICAN AMERICAN: 54 mL/min — AB (ref >90)
Glucose, Bld: 93 mg/dL (ref 70–99)
Potassium: 3.3 mEq/L — ABNORMAL LOW (ref 3.7–5.3)
Sodium: 142 mEq/L (ref 137–147)

## 2014-04-27 MED ORDER — LISINOPRIL 5 MG PO TABS
5.0000 mg | ORAL_TABLET | Freq: Every day | ORAL | Status: DC
  Administered 2014-04-27 – 2014-04-28 (×2): 5 mg via ORAL
  Filled 2014-04-27 (×2): qty 1

## 2014-04-27 MED ORDER — POTASSIUM CHLORIDE CRYS ER 20 MEQ PO TBCR
40.0000 meq | EXTENDED_RELEASE_TABLET | Freq: Once | ORAL | Status: AC
  Administered 2014-04-27: 40 meq via ORAL
  Filled 2014-04-27: qty 2

## 2014-04-27 MED ORDER — FUROSEMIDE 10 MG/ML IJ SOLN: 40.0000 mg | Freq: Every day | INTRAMUSCULAR | Status: DC

## 2014-04-27 MED ORDER — FUROSEMIDE 10 MG/ML IJ SOLN
40.0000 mg | Freq: Every day | INTRAMUSCULAR | Status: DC
  Administered 2014-04-27: 40 mg via INTRAVENOUS
  Filled 2014-04-27: qty 4

## 2014-04-27 MED ORDER — HYDRALAZINE HCL 50 MG PO TABS
50.0000 mg | ORAL_TABLET | Freq: Three times a day (TID) | ORAL | Status: DC
  Administered 2014-04-27 – 2014-04-28 (×3): 50 mg via ORAL
  Filled 2014-04-27 (×6): qty 1

## 2014-04-27 NOTE — Progress Notes (Signed)
Utilization Review Completed Chriselda Leppert J. Crayton Savarese, RN, BSN, NCM [phone]  

## 2014-04-27 NOTE — Progress Notes (Addendum)
Patient refused scheduled dose of keflex, stating that he felt nauseous, dizzy, and SOB. BP is 140/82, HR is 82, and O2 Sats are 99% on room air. MD notified. No new orders given. Will continue to monitor patient for further changes in condition.

## 2014-04-27 NOTE — Evaluation (Signed)
Physical Therapy Evaluation Patient Details Name: Billy Parker MRN: [id number] DOB: [birth year] Today's Date: 04/27/2014   History of Present Illness  Adm 4/27 with SOB and hypoxia. Acute on chronic CHF PMHx- EF 30%, cocaine abuse, HTN  Clinical Impression  Pt admitted with SOB and hypoxia due to CHF. Pt very close to baseline functionally, however full balance assessment limited by pt's dyspnea. Pt currently with functional limitations due to the deficits listed below (see PT Problem List).  Pt will benefit from skilled PT to increase their independence and safety with mobility to allow discharge to the venue listed below.       Follow Up Recommendations No PT follow up;Supervision - Intermittent    Equipment Recommendations  None recommended by PT    Recommendations for Other Services       Precautions / Restrictions        Mobility  Bed Mobility Overal bed mobility: Independent                Transfers Overall transfer level: Independent Equipment used: None                Ambulation/Gait Ambulation/Gait assistance: Min guard Ambulation Distance (Feet): 80 Feet Assistive device: None Gait Pattern/deviations: Step-through pattern;Decreased stride length Gait velocity: very slow Gait velocity interpretation: Below normal speed for age/gender General Gait Details: limited by dyspnea (although SaO2 improved with walking); ? anxiety component  Stairs            Wheelchair Mobility    Modified Rankin (Stroke Patients Only)       Balance Overall balance assessment: Independent         Standing balance support: No upper extremity supported Standing balance-Leahy Scale: Good           Rhomberg - Eyes Opened: 30 Rhomberg - Eyes Closed: 10 (no loss of balance, however opened eyes 2/2 felt unsteady) High level balance activites: Backward walking;Direction changes;Turns;Sudden stops;Head turns High Level Balance Comments: no loss of balance  or unsteadiness noted; pt moving very slowly due to dyspnea             Pertinent Vitals/Pain SaO2 93% on RA at rest 0/4 dyspnea SaO2 99% on RA with walking, 3/4 dyspnea    Home Living Family/patient expects to be discharged to:: Private residence Living Arrangements: Alone Available Help at Discharge: Personal care attendant;Available PRN/intermittently;Friend(s) Type of Home: House Home Access: Stairs to enter Entrance Stairs-Rails: Right Entrance Stairs-Number of Steps: 3 Home Layout: One level Home Equipment: Cane - single point Additional Comments: has a sister in [employer]; relies on friends and aid    Prior Function Level of Independence: Needs assistance   Gait / Transfers Assistance Needed: uses cane sometimes;   ADL's / Homemaking Assistance Needed: housework  Comments: friend drives for him; he does the grocery shopping     Hand Dominance        Extremity/Trunk Assessment   Upper Extremity Assessment: Overall WFL for tasks assessed           Lower Extremity Assessment: Overall WFL for tasks assessed      Cervical / Trunk Assessment: Normal  Communication   Communication: No difficulties  Cognition Arousal/Alertness: Awake/alert Behavior During Therapy: WFL for tasks assessed/performed Overall Cognitive Status: No family/caregiver present to determine baseline cognitive functioning                      General Comments General comments (skin integrity, edema, etc.): Pt perseverating on  the fact that he thinks a pill the nurse gave him made his legs swell and his head feel dizzy (earlier today)    Exercises        Assessment/Plan    PT Assessment Patient needs continued PT services  PT Diagnosis Difficulty walking   PT Problem List Decreased balance;Cardiopulmonary status limiting activity  PT Treatment Interventions Gait training;Stair training;Functional mobility training;Therapeutic activities;Balance training;Patient/family  education   PT Goals (Current goals can be found in the Care Plan section) Acute Rehab PT Goals Patient Stated Goal: stop feeling like he's gasping for breath PT Goal Formulation: With patient Time For Goal Achievement: 04/30/14 Potential to Achieve Goals: Good    Frequency Min 3X/week   Barriers to discharge Decreased caregiver support lives alone with PRN assist    Co-evaluation               End of Session Equipment Utilized During Treatment: Gait belt Activity Tolerance: Patient tolerated treatment well;Other (comment) (slightly anxious) Patient left: in bed;with call bell/phone within reach Nurse Communication: Mobility status         Time: 1546-1600 PT Time Calculation (min): 14 min   Charges:     PT Treatments $Gait Training: 8-22 mins   PT G CodesJeanie Cooks Janani Chamber May 04, 2014, 4:16 PM Pager [phone]

## 2014-04-27 NOTE — Progress Notes (Signed)
Patient refused scheduled dose of keflex.

## 2014-04-27 NOTE — Progress Notes (Signed)
Report given to receiving RN. Patient sitting on the side of the bed watching TV. No verbal complaints and no signs or symptoms of distress or discomfort.

## 2014-04-27 NOTE — Progress Notes (Signed)
Patient Demographics  Billy Parker, is a 78 y.o. male, DOB - [birth year], YSA:[id number]  Admit date - 04/26/2014   Admitting Physician Cristal Ford, DO  Outpatient Primary MD for the patient is Philis Fendt, MD  LOS - 1   Chief Complaint  Patient presents with  . Shortness of Breath        Assessment & Plan    Acute respiratory failure due to Acute on chronic systolic CHF EF 32% in January 2015  In the setting of positive cocaine use. Has responded well to IV Lasix and now on room air and with minimal to no symptoms, trace leg edema, will switch Lasix to once a day IV, continue hydralazine and ACE inhibitor. CHF education ordered for today. If stable most likely can be discharged in the next 24-48 hours. No beta blocker for now due to cocaine use. Outpatient cardiology followup post discharge.    Hypertension   Have added scheduled hydralazine, added low-dose ACE inhibitor, monitor blood pressure and adjust medications. No beta blocker secondary to cocaine use .   Urinary tract infection  -Patient presented to the emergency department one week ago, was found to have urinary tract infection at that time and was placed on Keflex.  -Will continue Keflex, complete a total of 7 days treatment.    Polysubstance abuse  -Patient's toxicology screen on 04/20/2014 was positive for THC as well as cocaine,  he was counseled on need for cessation of illicit drugs. He was clearly told the risks of death from using cocaine in the presence of beta blocker. He understands it.   Low potassium  Replace, recheck in the morning with magnesium levels.      Code Status: Full  Family Communication: None present  Disposition Plan: Home   Procedures  echo from January 2015 noted EF of 30-35%  with wall motion hypokinesis   Consults   CHF education ordered   Medications  Scheduled Meds: . cephALEXin  500 mg Oral QID  . enoxaparin (LOVENOX) injection  40 mg Subcutaneous Q24H  . [START ON 04/28/2014] furosemide  40 mg Intravenous Daily  . lisinopril  5 mg Oral Daily  . potassium chloride  40 mEq Oral Once  . potassium chloride  40 mEq Oral Once  . sodium chloride  3 mL Intravenous Q12H   Continuous Infusions:  PRN Meds:.sodium chloride, hydrALAZINE, sodium chloride  DVT Prophylaxis  Lovenox    Lab Results  Component Value Date   PLT 199 04/26/2014    Antibiotics    Anti-infectives   Start     Dose/Rate Route Frequency Ordered Stop   04/26/14 1500  cephALEXin (KEFLEX) capsule 500 mg     500 mg Oral 4 times daily 04/26/14 1446            Subjective:   Billy Parker today has, No headache, No chest pain, No abdominal pain - No Nausea, No new weakness tingling or numbness, No Cough - SOB.   Objective:   Filed Vitals:   04/26/14 2104 04/27/14 0155 04/27/14 0648 04/27/14 0812  BP: 125/74 121/81 142/91 154/105  Pulse: 81 63 76 78  Temp: 98 F (36.7 C) 97.9 F (36.6 C) 97.5 F (36.4 C) 97.5 F (36.4 C)  TempSrc: Oral Oral Oral Oral  Resp:      Height:      Weight:   68.221 kg (150 lb 6.4 oz)   SpO2: 98% 97% 98%     Wt Readings from Last 3 Encounters:  04/27/14 68.221 kg (150 lb 6.4 oz)  04/20/14 68.04 kg (150 lb)  03/24/14 71.85 kg (158 lb 6.4 oz)     Intake/Output Summary (Last 24 hours) at 04/27/14 0826 Last data filed at 04/27/14 1761  Gross per 24 hour  Intake    840 ml  Output   2625 ml  Net  -1785 ml     Physical Exam  Awake Alert, Oriented X 3, No new F.N deficits, Normal affect .AT,PERRAL Supple Neck,No JVD, No cervical lymphadenopathy appriciated.  Symmetrical Chest wall movement, Good air movement bilaterally, CTAB RRR,No Gallops,Rubs or new Murmurs, No Parasternal Heave +ve B.Sounds, Abd Soft, Non tender, No organomegaly  appriciated, No rebound - guarding or rigidity. No Cyanosis, Clubbing , Trace edema, No new Rash or bruise    Data Review   Micro Results No results found for this or any previous visit (from the past 240 hour(s)).  Radiology Reports Dg Chest 2 View  04/20/2014   CLINICAL DATA:  Chest pain and short of breath  EXAM: CHEST  2 VIEW  COMPARISON:  01/03/2014  FINDINGS: Heart size is upper normal. Negative for heart failure. Negative for infiltrate effusion or mass. Negative for pneumonia.  IMPRESSION: No active cardiopulmonary disease.   Electronically Signed   By: Franchot Gallo M.D.   On: 04/20/2014 10:13   Dg Chest Port 1 View  04/26/2014   CLINICAL DATA:  Shortness of breath, chest pain.  EXAM: PORTABLE CHEST - 1 VIEW  COMPARISON:  04/20/2014  FINDINGS: The heart is borderline enlarged. Lungs are clear. No effusions. No acute bony abnormality.  IMPRESSION: Borderline heart size.  No active disease.   Electronically Signed   By: Rolm Baptise M.D.   On: 04/26/2014 11:15    CBC  Recent Labs Lab 04/20/14 0944 04/26/14 1040  WBC 6.2 5.4  HGB 14.3 15.4  HCT 42.0 45.2  PLT 160 199  MCV 88.8 88.5  MCH 30.2 30.1  MCHC 34.0 34.1  RDW 14.2 14.9  LYMPHSABS 1.6  --   MONOABS 0.4  --   EOSABS 0.1  --   BASOSABS 0.0  --     Chemistries   Recent Labs Lab 04/20/14 0944 04/26/14 1209 04/27/14 0525  NA 142 140 142  K 3.7 3.8 3.3*  CL 104 103 99  CO2 21 21 27   GLUCOSE 101* 92 93  BUN 19 16 17   CREATININE 1.24 1.14 1.26  CALCIUM 8.6 9.0 9.0   ------------------------------------------------------------------------------------------------------------------ estimated creatinine clearance is 45.7 ml/min (by C-G formula based on Cr of 1.26). ------------------------------------------------------------------------------------------------------------------ No results found for this basename: HGBA1C,  in the last 72  hours ------------------------------------------------------------------------------------------------------------------ No results found for this basename: CHOL, HDL, LDLCALC, TRIG, CHOLHDL, LDLDIRECT,  in the last 72 hours ------------------------------------------------------------------------------------------------------------------ No results found for this basename: TSH, T4TOTAL, FREET3, T3FREE, THYROIDAB,  in the last 72 hours ------------------------------------------------------------------------------------------------------------------ No results found for this basename: VITAMINB12, FOLATE, FERRITIN, TIBC, IRON, RETICCTPCT,  in the last 72 hours  Coagulation profile No results found for this basename: INR, PROTIME,  in the last 168 hours  No results found for this basename: DDIMER,  in the last 72 hours  Cardiac Enzymes  Recent Labs Lab 04/20/14 0944  TROPONINI <0.30   ------------------------------------------------------------------------------------------------------------------  No components found with this basename: POCBNP,      Time Spent in minutes   35   Thurnell Lose M.D on 04/27/2014 at 8:26 AM  Between 7am to 7pm - Pager - [phone]  After 7pm go to [email or web address] - password [email or web address]  And look for the night coverage person covering for me after hours  [facility] Office  [phone]

## 2014-04-27 NOTE — Care Management Note (Signed)
    Page 1 of 1   04/27/2014     1:50:53 PM CARE MANAGEMENT NOTE 04/27/2014  Patient:  Billy Parker, Billy Parker   Account Number:  [id number]  Date Initiated:  04/27/2014  Documentation initiated by:  Fuller Mandril  Subjective/Objective Assessment:   78 year old male with dyspnea on exertion, PND, and orthopnea.  Has peripheral edema and elevated BNP.  Chest x-ray appeared clear.  However, patient with new O2 requirement, becoming hypoxic on room air.  Will admit for CHF.  Home alone.     Action/Plan:   IV diureses.//Access for [facility] services   Anticipated DC Date:  04/30/2014   Anticipated DC Plan:  [place]  CM consult      Choice offered to / List presented to:             Status of service:  In process, will continue to follow Medicare Important Message given?   (If response is "NO", the following Medicare IM given date fields will be blank) Date Medicare IM given:   Date Additional Medicare IM given:    Discharge Disposition:    Per UR Regulation:  Reviewed for med. necessity/level of care/duration of stay  If discussed at [place] of Stay Meetings, dates discussed:    Comments:

## 2014-04-28 DIAGNOSIS — R799 Abnormal finding of blood chemistry, unspecified: Secondary | ICD-10-CM

## 2014-04-28 DIAGNOSIS — I2589 Other forms of chronic ischemic heart disease: Secondary | ICD-10-CM

## 2014-04-28 DIAGNOSIS — N183 Chronic kidney disease, stage 3 unspecified: Secondary | ICD-10-CM

## 2014-04-28 DIAGNOSIS — I10 Essential (primary) hypertension: Secondary | ICD-10-CM

## 2014-04-28 DIAGNOSIS — I5021 Acute systolic (congestive) heart failure: Secondary | ICD-10-CM

## 2014-04-28 LAB — MAGNESIUM: MAGNESIUM: 1.8 mg/dL (ref 1.5–2.5)

## 2014-04-28 LAB — POTASSIUM: Potassium: 3.8 mEq/L (ref 3.7–5.3)

## 2014-04-28 MED ORDER — FUROSEMIDE 40 MG PO TABS: 40.0000 mg | ORAL_TABLET | Freq: Two times a day (BID) | ORAL | Status: DC

## 2014-04-28 MED ORDER — CARVEDILOL 3.125 MG PO TABS
3.1250 mg | ORAL_TABLET | Freq: Two times a day (BID) | ORAL | Status: DC
  Administered 2014-04-28 – 2014-04-29 (×2): 3.125 mg via ORAL
  Filled 2014-04-28 (×5): qty 1

## 2014-04-28 MED ORDER — FUROSEMIDE 20 MG PO TABS
20.0000 mg | ORAL_TABLET | Freq: Every day | ORAL | Status: DC
  Administered 2014-04-29: 20 mg via ORAL
  Filled 2014-04-28: qty 1

## 2014-04-28 MED ORDER — LISINOPRIL 10 MG PO TABS
10.0000 mg | ORAL_TABLET | Freq: Every day | ORAL | Status: DC
  Administered 2014-04-29: 10 mg via ORAL
  Filled 2014-04-28: qty 1

## 2014-04-28 MED ORDER — FUROSEMIDE 40 MG PO TABS
40.0000 mg | ORAL_TABLET | Freq: Once | ORAL | Status: AC
  Administered 2014-04-28: 40 mg via ORAL
  Filled 2014-04-28: qty 1

## 2014-04-28 NOTE — Progress Notes (Signed)
Patient has been having frequent PVCs and PACs and had an 8 beat run of VTach during my shift. MD has been notified. New orders has been given. Patient has no complaints at this time and no signs or symptoms of distress. Will continue to monitor patient for further changes in condition.

## 2014-04-28 NOTE — Progress Notes (Addendum)
Report given to receiving RN. Patient in bed sleeping. No signs or symptoms of distress or discomfort.

## 2014-04-28 NOTE — Progress Notes (Signed)
Pt with 7 beat run of vtach, pt asymptomatic and sleeping at time. Pt now in [place] 70-80. Pt with history of NSVT this admission. Will continue to monitor. Ronnette Hila, RN

## 2014-04-28 NOTE — Progress Notes (Signed)
Scheduled dose of IV lasix not given due to loss of IV access. MD aware.

## 2014-04-28 NOTE — Progress Notes (Signed)
Patient refused all scheduled keflex doses during my shift. MD notified. No changes were made at this time.

## 2014-04-28 NOTE — Progress Notes (Signed)
[facility] PROGRESS NOTE  Filed Weights   04/26/14 1751 04/27/14 0648 04/28/14 0504  Weight: 70.7 kg (155 lb 13.8 oz) 68.221 kg (150 lb 6.4 oz) 67.405 kg (148 lb 9.6 oz)        Intake/Output Summary (Last 24 hours) at 04/28/14 1308 Last data filed at 04/28/14 1191  Gross per 24 hour  Intake    720 ml  Output   2650 ml  Net  -1930 ml     Assessment/Plan: Acute exacerbation of CHF (congestive heart failure) - On IV lasix, with moderate diuresis. ACE-I - Fluid restrict, low sodium diet. - Daily weights, monitor electrolytes. - echo [id number]: Systolic function was moderately to severely reduced. The estimated ejection fraction was in the range of 30% to 35%. Global hypokinesis, with more predominant inferolateral hypokinesis. - No JVD, lungs clear. Change lasix to orals.   HTN (hypertension) - improved, d/c hydralazine. - Increase ACE-I.    Code Status: full Family Communication: None at bedside. Admission, patients condition and plan of care including tests being ordered have been discussed with the patient, who indicates understanding and agrees with the plan and Code Status.  Disposition Plan: Admitted    Consultants:  none  Procedures: ECHO: pending  Antibiotics:  none  HPI/Subjective: SOB is improved.  Objective: Filed Vitals:   04/27/14 2117 04/28/14 0504 04/28/14 0751 04/28/14 1000  BP: 132/90 148/75 137/83 108/69  Pulse: 86 77 60 88  Temp: 97.2 F (36.2 C) 97.6 F (36.4 C)    TempSrc: Oral Oral    Resp: 19 18    Height:      Weight:  67.405 kg (148 lb 9.6 oz)    SpO2: 98% 95%       Exam:  General: Alert, awake, oriented x3, in no acute distress.  HEENT: No bruits, no goiter. -JVD Heart: Regular rate and rhythm, without murmurs, rubs, gallops.  Lungs: Good air movement, clear Abdomen: Soft, nontender, nondistended, positive bowel sounds.    Data Reviewed: Basic Metabolic Panel:  Recent Labs Lab 04/26/14 1209 04/27/14 0525  04/28/14 0351  NA 140 142  --   K 3.8 3.3* 3.8  CL 103 99  --   CO2 21 27  --   GLUCOSE 92 93  --   BUN 16 17  --   CREATININE 1.14 1.26  --   CALCIUM 9.0 9.0  --   MG  --   --  1.8   Liver Function Tests: No results found for this basename: AST, ALT, ALKPHOS, BILITOT, PROT, ALBUMIN,  in the last 168 hours No results found for this basename: LIPASE, AMYLASE,  in the last 168 hours No results found for this basename: AMMONIA,  in the last 168 hours CBC:  Recent Labs Lab 04/26/14 1040  WBC 5.4  HGB 15.4  HCT 45.2  MCV 88.5  PLT 199   Cardiac Enzymes: No results found for this basename: CKTOTAL, CKMB, CKMBINDEX, TROPONINI,  in the last 168 hours BNP (last 3 results)  Recent Labs  01/04/14 0500 04/20/14 0944 04/26/14 1040  PROBNP 8684.0* 15684.0* 16079.0*   CBG: No results found for this basename: GLUCAP,  in the last 168 hours  No results found for this or any previous visit (from the past 240 hour(s)).   Studies: No results found.  Scheduled Meds: . cephALEXin  500 mg Oral QID  . enoxaparin (LOVENOX) injection  40 mg Subcutaneous Q24H  . furosemide  40 mg Intravenous Daily  . hydrALAZINE  50 mg Oral 3 times per day  . lisinopril  5 mg Oral Daily  . sodium chloride  3 mL Intravenous Q12H   Continuous Infusions:    Charlynne Cousins  [facility] Pager [phone] If 8PM-8AM, please contact night-coverage at [email or web address], password [facility] 04/28/2014, 1:08 PM  LOS: 2 days

## 2014-04-29 DIAGNOSIS — I059 Rheumatic mitral valve disease, unspecified: Secondary | ICD-10-CM

## 2014-04-29 DIAGNOSIS — F1410 Cocaine abuse, uncomplicated: Secondary | ICD-10-CM

## 2014-04-29 LAB — PRO B NATRIURETIC PEPTIDE: Pro B Natriuretic peptide (BNP): 2107 pg/mL — ABNORMAL HIGH (ref 0–450)

## 2014-04-29 MED ORDER — FUROSEMIDE 20 MG PO TABS: 20.0000 mg | ORAL_TABLET | Freq: Every day | ORAL | Status: DC

## 2014-04-29 MED ORDER — LISINOPRIL 10 MG PO TABS: 10.0000 mg | ORAL_TABLET | Freq: Every day | ORAL | Status: DC

## 2014-04-29 MED ORDER — CARVEDILOL 12.5 MG PO TABS: 12.5000 mg | ORAL_TABLET | Freq: Two times a day (BID) | ORAL | Status: DC

## 2014-04-29 NOTE — Progress Notes (Signed)
Physical Therapy Treatment Patient Details Name: Billy Parker MRN: [id number] DOB: 08/02/1936 Today's Date: 04/29/2014    History of Present Illness Adm 4/27 with SOB and hypoxia. Acute on chronic CHF PMHx- EF 30%, cocaine abuse, HTN    PT Comments    Pt remarkably better today. No dyspnea with activity (including climbing one flight of steps). SaO2 91% on RA after stairs; 97% on RA at rest. Pt with no further PT needs and discharging from PT services.   Follow Up Recommendations  No PT follow up;Supervision - Intermittent     Equipment Recommendations  None recommended by PT    Recommendations for Other Services       Precautions / Restrictions Precautions Precautions: None    Mobility  Bed Mobility                  Transfers Overall transfer level: Independent                  Ambulation/Gait Ambulation/Gait assistance: Independent Ambulation Distance (Feet): 200 Feet Assistive device: None Gait Pattern/deviations: WFL(Within Functional Limits)   Gait velocity interpretation: >2.62 ft/sec, indicative of independent community ambulator General Gait Details: no dyspnea   Stairs Stairs: Yes Stairs assistance: Modified independent (Device/Increase time) Stair Management: One rail Right;Forwards Number of Stairs: 10 General stair comments: no dyspnea  Wheelchair Mobility    Modified Rankin (Stroke Patients Only)       Balance Overall balance assessment: Independent                                  Cognition Arousal/Alertness: Awake/alert Behavior During Therapy: WFL for tasks assessed/performed Overall Cognitive Status: Within Functional Limits for tasks assessed                      Exercises      General Comments General comments (skin integrity, edema, etc.): Pt reports he is going home today. Denies any concerns over mobility.      Pertinent Vitals/Pain See above     Home Living                       Prior Function            PT Goals (current goals can now be found in the care plan section) Progress towards PT goals: Goals met/education completed, patient discharged from PT    Frequency       PT Plan Current plan remains appropriate    Co-evaluation             End of Session   Activity Tolerance: Patient tolerated treatment well Patient left: in chair;with call bell/phone within reach     Time: 1030-1039 PT Time Calculation (min): 9 min  Charges:  $Gait Training: 8-22 mins                    G Codes:      Lynn P Sasser 04/29/2014, 10:56 AM Pager [phone]    

## 2014-04-29 NOTE — Discharge Summary (Signed)
Physician Discharge Summary  Sire Poet RCV:[id number] DOB: 1936-10-29 DOA: 04/26/2014  PCP: Philis Fendt, MD  Admit date: 04/26/2014 Discharge date: 04/29/2014  Time spent: 35 minutes  Recommendations for Outpatient Follow-up:  1. Follow up with Dr. Ellyn Hack in 1 week. 2. - titrate HF meds as tolerate it.  BNP    Component Value Date/Time   PROBNP 16079.0* 04/26/2014 1040   Filed Weights   04/27/14 0648 04/28/14 0504 04/29/14 0451  Weight: 68.221 kg (150 lb 6.4 oz) 67.405 kg (148 lb 9.6 oz) 66.769 kg (147 lb 3.2 oz)     Discharge Diagnoses:  Principal Problem:   Acute exacerbation of CHF (congestive heart failure) Active Problems:   HTN (hypertension)   Acute systolic HF (heart failure)   Discharge Condition: stable  Diet recommendation: low sodium fluid restricted diet   History of present illness:  78 y.o. male  With a history of nonischemic cardiomyopathy, EF 30-35%, history of cocaine abuse, hypertension that presents to the emergency department for shortness of breath. Patient states that he had shortness of breath since December which has been intermittent. He states that he recently came to the emergency department for weakness and shortness of breath approximately one week ago, and was found to have a urinary tract infection. Patient was not admitted at that time, he was given antibiotics and sent home. Patient did not follow with his primary care physician at that time. He presents today with increasing short of breath awakens him from sleep. Nothing seems to make his shortness of breath improved. Patient states he does not take Lasix at home but only takes HCTZ. Currently he denies any dizziness, chest pain, abdominal pain.   Hospital Course:  Acute exacerbation of CHF (congestive heart failure)  - On IV lasix, with moderate diuresis.Was cont on  ACE-I at a higher dose. - Fluid restrict, low sodium diet.  - Daily weights, monitor electrolytes.  - echo  1.0.2585: Systolic function was moderately to severely reduced. The estimated ejection fraction was in the range of 30% to 35%. Global hypokinesis, with more predominant inferolateral hypokinesis.  - Change lasix to orals.  Started on low dose coreg.  HTN (hypertension)  - Improved. With increase ACE-I and starting coreg.   Procedures:  CXR  Consultations:  none  Discharge Exam: Filed Vitals:   04/29/14 0839  BP: 114/53  Pulse: 52  Temp:   Resp: 18    General: A&O x3 Cardiovascular: RRR Respiratory: good air movement CTA B/L  Discharge Instructions  Discharge Orders   Future Appointments Provider Department Dept Phone   05/05/2014 10:00 AM Cecilie Kicks, NP [facility] [phone]   06/22/2014 9:15 AM Adin Hector, MD [facility] [place] [phone]   Future Orders Complete By Expires   Diet - low sodium heart healthy  As directed    Increase activity slowly  As directed        Medication List    STOP taking these medications       cephALEXin 500 MG capsule  Commonly known as:  KEFLEX     hydrochlorothiazide 12.5 MG tablet  Commonly known as:  HYDRODIURIL      TAKE these medications       albuterol 108 (90 BASE) MCG/ACT inhaler  Commonly known as:  PROVENTIL HFA;VENTOLIN HFA  Inhale into the lungs every 4 (four) hours as needed for wheezing or shortness of breath.     carvedilol 12.5 MG tablet  Commonly known as:  COREG  Take 1 tablet (12.5 mg total) by mouth 2 (two) times daily with a meal.     erythromycin ophthalmic ointment  1 application at bedtime.     furosemide 20 MG tablet  Commonly known as:  LASIX  Take 1 tablet (20 mg total) by mouth daily.     lisinopril 10 MG tablet  Commonly known as:  PRINIVIL,ZESTRIL  Take 1 tablet (10 mg total) by mouth daily.       No Known Allergies     Follow-up Information   Follow up with Leonie Man, MD On 05/05/2014. (@ 10:00 am spoke with [facility] )    Specialty:   Cardiology   Contact information:   [address] [place] [address] [phone]        The results of significant diagnostics from this hospitalization (including imaging, microbiology, ancillary and laboratory) are listed below for reference.    Significant Diagnostic Studies: Dg Chest 2 View  04/20/2014   CLINICAL DATA:  Chest pain and short of breath  EXAM: CHEST  2 VIEW  COMPARISON:  01/03/2014  FINDINGS: Heart size is upper normal. Negative for heart failure. Negative for infiltrate effusion or mass. Negative for pneumonia.  IMPRESSION: No active cardiopulmonary disease.   Electronically Signed   By: Franchot Gallo M.D.   On: 04/20/2014 10:13   Dg Chest Port 1 View  04/26/2014   CLINICAL DATA:  Shortness of breath, chest pain.  EXAM: PORTABLE CHEST - 1 VIEW  COMPARISON:  04/20/2014  FINDINGS: The heart is borderline enlarged. Lungs are clear. No effusions. No acute bony abnormality.  IMPRESSION: Borderline heart size.  No active disease.   Electronically Signed   By: Rolm Baptise M.D.   On: 04/26/2014 11:15    Microbiology: No results found for this or any previous visit (from the past 240 hour(s)).   Labs: Basic Metabolic Panel:  Recent Labs Lab 04/26/14 1209 04/27/14 0525 04/28/14 0351  NA 140 142  --   K 3.8 3.3* 3.8  CL 103 99  --   CO2 21 27  --   GLUCOSE 92 93  --   BUN 16 17  --   CREATININE 1.14 1.26  --   CALCIUM 9.0 9.0  --   MG  --   --  1.8   Liver Function Tests: No results found for this basename: AST, ALT, ALKPHOS, BILITOT, PROT, ALBUMIN,  in the last 168 hours No results found for this basename: LIPASE, AMYLASE,  in the last 168 hours No results found for this basename: AMMONIA,  in the last 168 hours CBC:  Recent Labs Lab 04/26/14 1040  WBC 5.4  HGB 15.4  HCT 45.2  MCV 88.5  PLT 199   Cardiac Enzymes: No results found for this basename: CKTOTAL, CKMB, CKMBINDEX, TROPONINI,  in the last 168 hours BNP: BNP (last 3  results)  Recent Labs  01/04/14 0500 04/20/14 0944 04/26/14 1040  PROBNP 8684.0* 15684.0* 16079.0*   CBG: No results found for this basename: GLUCAP,  in the last 168 hours     Signed:  Charlynne Cousins  [facility] 04/29/2014, 11:09 AM

## 2014-04-29 NOTE — Progress Notes (Signed)
1145 discharge instructions given by Juanita,rn  BNP drawn before discharge

## 2014-05-05 ENCOUNTER — Ambulatory Visit (INDEPENDENT_AMBULATORY_CARE_PROVIDER_SITE_OTHER): Payer: Medicare HMO | Admitting: Cardiology

## 2014-05-05 ENCOUNTER — Encounter: Payer: Self-pay | Admitting: Cardiology

## 2014-05-05 VITALS — BP 140/96 | HR 88 | Ht 66.0 in | Wt 157.9 lb

## 2014-05-05 DIAGNOSIS — I255 Ischemic cardiomyopathy: Secondary | ICD-10-CM

## 2014-05-05 DIAGNOSIS — I2589 Other forms of chronic ischemic heart disease: Secondary | ICD-10-CM

## 2014-05-05 DIAGNOSIS — N183 Chronic kidney disease, stage 3 unspecified: Secondary | ICD-10-CM

## 2014-05-05 DIAGNOSIS — N289 Disorder of kidney and ureter, unspecified: Secondary | ICD-10-CM | POA: Diagnosis not present

## 2014-05-05 DIAGNOSIS — I509 Heart failure, unspecified: Secondary | ICD-10-CM | POA: Diagnosis not present

## 2014-05-05 DIAGNOSIS — F1410 Cocaine abuse, uncomplicated: Secondary | ICD-10-CM | POA: Diagnosis not present

## 2014-05-05 DIAGNOSIS — I059 Rheumatic mitral valve disease, unspecified: Secondary | ICD-10-CM

## 2014-05-05 DIAGNOSIS — I10 Essential (primary) hypertension: Secondary | ICD-10-CM

## 2014-05-05 DIAGNOSIS — I340 Nonrheumatic mitral (valve) insufficiency: Secondary | ICD-10-CM

## 2014-05-05 NOTE — Assessment & Plan Note (Signed)
SCr has been up in the past, will re -check

## 2014-05-05 NOTE — Assessment & Plan Note (Signed)
On meds

## 2014-05-05 NOTE — Progress Notes (Signed)
05/05/2014 [name]   [birth year]  [id number]  Primary Physicia Philis Fendt, MD Primary Cardiologist: Dr Ellyn Hack  HPI:  78 y/o AA male we have seen in the past for CHF. He has a history of non ischemic cardiomyopathy. His cath in July 2014 showed non obstructive CAD. He was admitted for CHF exacerbation Jan 2015 and recently 04/26/14. He has been positive for Cocaine on each of his admissions. He is not felt to be an ICD candidate. He had stopped some of his medications prior to his last office visit because he said they made him dizzy. His meds had been titrated up in March at an office visit here. Since discharge he says he "feels great". He says he has learned his lesson and stopped Cocaine. He is taking his medications.    Current Outpatient Prescriptions  Medication Sig Dispense Refill  . albuterol (PROVENTIL HFA;VENTOLIN HFA) 108 (90 BASE) MCG/ACT inhaler Inhale into the lungs every 4 (four) hours as needed for wheezing or shortness of breath.      . carvedilol (COREG) 12.5 MG tablet Take 1 tablet (12.5 mg total) by mouth 2 (two) times daily with a meal.  60 tablet  5  . erythromycin ophthalmic ointment 1 application at bedtime.      . furosemide (LASIX) 20 MG tablet Take 1 tablet (20 mg total) by mouth daily.  30 tablet  0  . lisinopril (PRINIVIL,ZESTRIL) 10 MG tablet Take 1 tablet (10 mg total) by mouth daily.  30 tablet  5   No current facility-administered medications for this visit.    No Known Allergies  History   Social History  . Marital Status: Divorced    Spouse Name: N/A    Number of Children: N/A  . Years of Education: N/A   Occupational History  . Not on file.   Social History Main Topics  . Smoking status: Never Smoker   . Smokeless tobacco: Never Used  . Alcohol Use: 1.2 oz/week    2 Cans of beer per week     Comment: occasional  . Drug Use: No     Comment: 07/06/2013 "last used crack 07/02/2013"  . Sexual Activity: Not Currently   Other Topics  Concern  . Not on file   Social History Narrative  . No narrative on file     Review of Systems: General: negative for chills, fever, night sweats or weight changes.  Cardiovascular: negative for chest pain, dyspnea on exertion, edema, orthopnea, palpitations, paroxysmal nocturnal dyspnea or shortness of breath Dermatological: negative for rash Respiratory: negative for cough or wheezing Urologic: negative for hematuria Abdominal: negative for nausea, vomiting, diarrhea, bright red blood per rectum, melena, or hematemesis Neurologic: negative for visual changes, syncope, or dizziness All other systems reviewed and are otherwise negative except as noted above.    Blood pressure 140/96, pulse 88, height 5\' 6"  (1.676 m), weight 157 lb 14.4 oz (71.623 kg).  General appearance: alert, cooperative and no distress Neck: no carotid bruit and no JVD Lungs: clear to auscultation bilaterally Heart: regular rate and rhythm and 2/6 MR murmur   ASSESSMENT AND PLAN:   Acute exacerbation of CHF (congestive heart failure) Just discharged after CHF exacerbation secondary to non compliance  Cocaine abuse Positive on his last three admissions-   Cardiomyopathy, non ischemic, EF 30-35% Not an ICD candidate at this time secondary to Cocaine and compliance problems  HTN (hypertension) On meds  CKD (chronic kidney disease) stage 3, GFR 30-59 ml/min SCr  has been up in the past, will re -check  Mitral regurgitation- moderate At least moderate on exam   PLAN  Continue medical Rx. Follow up in 3 months, if he can be compliant we could consider checking another echo in 6 monthds and revisiting ICD.   Doreene Burke [facility] 05/05/2014 2:35 PM

## 2014-05-05 NOTE — Assessment & Plan Note (Signed)
Positive on his last three admissions-

## 2014-05-05 NOTE — Assessment & Plan Note (Signed)
Just discharged after CHF exacerbation secondary to non compliance

## 2014-05-05 NOTE — Patient Instructions (Signed)
Your physician recommends that you schedule a follow-up appointment in: 3  Months Have lab today Low salt diet

## 2014-05-05 NOTE — Assessment & Plan Note (Signed)
At least moderate on exam

## 2014-05-05 NOTE — Assessment & Plan Note (Signed)
Not an ICD candidate at this time secondary to Cocaine and compliance problems

## 2014-05-06 ENCOUNTER — Other Ambulatory Visit (INDEPENDENT_AMBULATORY_CARE_PROVIDER_SITE_OTHER): Payer: Self-pay | Admitting: Surgery

## 2014-05-06 ENCOUNTER — Telehealth (INDEPENDENT_AMBULATORY_CARE_PROVIDER_SITE_OTHER): Payer: Self-pay | Admitting: Surgery

## 2014-05-06 NOTE — Telephone Encounter (Signed)
Old EPIC orders must've timed out.  Seen by cardiology yesterday after CHF exacerbation last month.  Please make sure that he truly cleared for surgery now (probably just a letter)

## 2014-05-06 NOTE — Telephone Encounter (Signed)
patient cannot have surgery at [facility] per his insurance he has [employer] SNP not in network with [facility] and has no out of network benefits Please write orders to have at a main facility

## 2014-05-12 ENCOUNTER — Telehealth: Payer: Self-pay | Admitting: Cardiology

## 2014-05-12 NOTE — Telephone Encounter (Signed)
Forward to [place]. Patient was seen prior to visit and was cleared for surgery in March 2015 by harding/brittainy

## 2014-05-12 NOTE — Telephone Encounter (Signed)
Called to get cardiac clearance faxed over to [place] in writing on this pt. The pt is scheduled for surgery with Dr Johney Maine on 06/11/14 hernia repair. They will send a note the doctor and get back in touch with me.

## 2014-05-12 NOTE — Telephone Encounter (Signed)
Pt saw Luke on 05-05-14. She wants to know if he has cardiac clarence for surgery?Please fax over in writing [phone].

## 2014-06-01 ENCOUNTER — Encounter (HOSPITAL_COMMUNITY): Payer: Self-pay

## 2014-06-02 ENCOUNTER — Other Ambulatory Visit: Payer: Self-pay | Admitting: Cardiology

## 2014-06-02 NOTE — Telephone Encounter (Signed)
Rx was sent to pharmacy electronically. 

## 2014-06-03 ENCOUNTER — Inpatient Hospital Stay (HOSPITAL_COMMUNITY)
Admission: RE | Admit: 2014-06-03 | Discharge: 2014-06-03 | Disposition: A | Discharge status: Home / Self Care | Payer: Commercial Managed Care - HMO | Source: Ambulatory Visit

## 2014-06-03 NOTE — Pre-Procedure Instructions (Signed)
Billy Parker  06/03/2014   Your procedure is scheduled on:  Friday, June 12th.   Report to [facility] Admitting at 7:00 AM.  Call this number if you have problems the morning of surgery: [phone]   Remember:   Do not eat food or drink liquids after midnight Thursday.   Take these medicines the morning of surgery with A SIP OF WATER: Carvedilol.  Please take inhaler the morning of surgery   Do not wear jewelry - no watches or rings.  Do not wear lotions or colognes. You may NOT wear deodorant.    Men may shave face and neck.  Do not bring valuables to the hospital.  [facility] is not responsible for any belongings or valuables.               Contacts, dentures or bridgework may not be worn into surgery.  Leave suitcase in the car. After surgery it may be brought to your room.  For patients admitted to the hospital, discharge time is determined by your treatment team.               Patients discharged the day of surgery will not be allowed to drive home. (If you do go home, someone will need to stay with you for the first 24 hrs afterwards.   Name and phone number of your driver:    Special Instructions: the attached sheet "Preparing for Surgery" instruction sheet.   Please read over the following fact sheets that you were given: Pain Booklet and Surgical Site Infection Prevention

## 2014-06-09 ENCOUNTER — Encounter (HOSPITAL_COMMUNITY): Payer: Self-pay

## 2014-06-09 ENCOUNTER — Encounter (HOSPITAL_COMMUNITY)
Admission: RE | Admit: 2014-06-09 | Discharge: 2014-06-09 | Disposition: A | Discharge status: Home / Self Care | Payer: Medicare HMO | Source: Ambulatory Visit | Attending: Surgery | Admitting: Surgery

## 2014-06-09 DIAGNOSIS — I509 Heart failure, unspecified: Secondary | ICD-10-CM | POA: Diagnosis not present

## 2014-06-09 DIAGNOSIS — I129 Hypertensive chronic kidney disease with stage 1 through stage 4 chronic kidney disease, or unspecified chronic kidney disease: Secondary | ICD-10-CM | POA: Diagnosis not present

## 2014-06-09 DIAGNOSIS — I2510 Atherosclerotic heart disease of native coronary artery without angina pectoris: Secondary | ICD-10-CM | POA: Diagnosis not present

## 2014-06-09 DIAGNOSIS — F1410 Cocaine abuse, uncomplicated: Secondary | ICD-10-CM | POA: Diagnosis not present

## 2014-06-09 DIAGNOSIS — K4120 Bilateral femoral hernia, without obstruction or gangrene, not specified as recurrent: Secondary | ICD-10-CM | POA: Diagnosis not present

## 2014-06-09 DIAGNOSIS — M545 Low back pain, unspecified: Secondary | ICD-10-CM | POA: Diagnosis not present

## 2014-06-09 DIAGNOSIS — K4090 Unilateral inguinal hernia, without obstruction or gangrene, not specified as recurrent: Secondary | ICD-10-CM | POA: Diagnosis present

## 2014-06-09 DIAGNOSIS — I428 Other cardiomyopathies: Secondary | ICD-10-CM | POA: Diagnosis not present

## 2014-06-09 DIAGNOSIS — K429 Umbilical hernia without obstruction or gangrene: Secondary | ICD-10-CM | POA: Diagnosis not present

## 2014-06-09 DIAGNOSIS — G8929 Other chronic pain: Secondary | ICD-10-CM | POA: Diagnosis not present

## 2014-06-09 DIAGNOSIS — N183 Chronic kidney disease, stage 3 unspecified: Secondary | ICD-10-CM | POA: Diagnosis not present

## 2014-06-09 LAB — COMPREHENSIVE METABOLIC PANEL
ALT: 26 U/L (ref 0–53)
AST: 39 U/L — ABNORMAL HIGH (ref 0–37)
Albumin: 3.3 g/dL — ABNORMAL LOW (ref 3.5–5.2)
Alkaline Phosphatase: 66 U/L (ref 39–117)
BILIRUBIN TOTAL: 0.5 mg/dL (ref 0.3–1.2)
BUN: 32 mg/dL — ABNORMAL HIGH (ref 6–23)
CHLORIDE: 93 meq/L — AB (ref 96–112)
CO2: 22 meq/L (ref 19–32)
Calcium: 9.4 mg/dL (ref 8.4–10.5)
Creatinine, Ser: 1.81 mg/dL — ABNORMAL HIGH (ref 0.50–1.35)
GFR calc Af Amer: 40 mL/min — ABNORMAL LOW (ref >90)
GFR, EST NON AFRICAN AMERICAN: 35 mL/min — AB (ref >90)
GLUCOSE: 101 mg/dL — AB (ref 70–99)
POTASSIUM: 4.1 meq/L (ref 3.7–5.3)
SODIUM: 132 meq/L — AB (ref 137–147)
Total Protein: 7.9 g/dL (ref 6.0–8.3)

## 2014-06-09 LAB — CBC
HEMATOCRIT: 43.3 % (ref 39.0–52.0)
HEMOGLOBIN: 15.4 g/dL (ref 13.0–17.0)
MCH: 30.9 pg (ref 26.0–34.0)
MCHC: 35.6 g/dL (ref 30.0–36.0)
MCV: 86.8 fL (ref 78.0–100.0)
Platelets: 146 10*3/uL — ABNORMAL LOW (ref 150–400)
RBC: 4.99 MIL/uL (ref 4.22–5.81)
RDW: 13.6 % (ref 11.5–15.5)
WBC: 5 10*3/uL (ref 4.0–10.5)

## 2014-06-09 NOTE — Pre-Procedure Instructions (Addendum)
Billy Parker  06/09/2014   Your procedure is scheduled on:  Friday, June 12th.   Report to [facility] Admitting at 7:00 AM.  Call this number if you have problems the morning of surgery: [phone]   Remember:   Do not eat food or drink liquids after midnight Thursday.   Take these medicines the morning of surgery with A SIP OF WATER: Carvedilol.  Please take inhaler the morning of surgery,bring with you     Take all meds as ordered until day of surgery except as instructed below or per dr     Bridgette Habermann all herbel meds, nsaids (aleve,naproxen,advil,ibuprofen) now including vitamins, aspirin   Do not wear jewelry - no watches or rings.  Do not wear lotions or colognes..    Men may shave face and neck.  Do not bring valuables to the hospital.  [facility] is not responsible for any belongings or valuables.               Contacts, dentures or bridgework may not be worn into surgery.  Leave suitcase in the car. After surgery it may be brought to your room.  For patients admitted to the hospital, discharge time is determined by your treatment team.               Patients discharged the day of surgery will not be allowed to drive home. (If you do go home, someone will need to stay with you for the first 24 hrs afterwards.   Name and phone number of your driver:    Special Instructions:  Special Instructions: [place] - Preparing for Surgery  Before surgery, you can play an important role.  Because skin is not sterile, your skin needs to be as free of germs as possible.  You can reduce the number of germs on you skin by washing with CHG (chlorahexidine gluconate) soap before surgery.  CHG is an antiseptic cleaner which kills germs and bonds with the skin to continue killing germs even after washing.  Please DO NOT use if you have an allergy to CHG or antibacterial soaps.  If your skin becomes reddened/irritated stop using the CHG and inform your nurse when you arrive at Short  Stay.  Do not shave (including legs and underarms) for at least 48 hours prior to the first CHG shower.  You may shave your face.  Please follow these instructions carefully:   1.  Shower with CHG Soap the night before surgery and the morning of Surgery.  2.  If you choose to wash your hair, wash your hair first as usual with your normal shampoo.  3.  After you shampoo, rinse your hair and body thoroughly to remove the Shampoo.  4.  Use CHG as you would any other liquid soap.  You can apply chg directly  to the skin and wash gently with scrungie or a clean washcloth.  5.  Apply the CHG Soap to your body ONLY FROM THE NECK DOWN.  Do not use on open wounds or open sores.  Avoid contact with your eyes ears, mouth and genitals (private parts).  Wash genitals (private parts)       with your normal soap.  6.  Wash thoroughly, paying special attention to the area where your surgery will be performed.  7.  Thoroughly rinse your body with warm water from the neck down.  8.  DO NOT shower/wash with your normal soap after using and rinsing off the CHG  Soap.  9.  Pat yourself dry with a clean towel.            10.  Wear clean pajamas.            11.  Place clean sheets on your bed the night of your first shower and do not sleep with pets.  Day of Surgery  Do not apply any lotions/deodorants the morning of surgery.  Please wear clean clothes to the hospital/surgery center.   Please read over the following fact sheets that you were given: Pain Booklet and Surgical Site Infection Prevention

## 2014-06-10 ENCOUNTER — Telehealth (INDEPENDENT_AMBULATORY_CARE_PROVIDER_SITE_OTHER): Payer: Self-pay

## 2014-06-10 ENCOUNTER — Encounter (HOSPITAL_COMMUNITY): Payer: Self-pay

## 2014-06-10 ENCOUNTER — Telehealth: Payer: Self-pay | Admitting: Cardiology

## 2014-06-10 MED ORDER — CEFAZOLIN SODIUM-DEXTROSE 2-3 GM-% IV SOLR
2.0000 g | INTRAVENOUS | Status: AC
  Administered 2014-06-11: 2 g via INTRAVENOUS
  Filled 2014-06-10: qty 50

## 2014-06-10 NOTE — Telephone Encounter (Signed)
Called RN informed Lars Mage of verbal order.

## 2014-06-10 NOTE — Telephone Encounter (Signed)
Pt has been cleared for surgery the notes are all in epic.

## 2014-06-10 NOTE — Telephone Encounter (Signed)
RN spoke to [employer] PA Per Lurena Joiner, verbal order patient is cleared for surgery watch for volume overload due to patient's Ischemic cardiomyopathy RN will inform Lars Mage

## 2014-06-10 NOTE — Telephone Encounter (Signed)
Called to speak to Trixie Dredge, RN who I know was trying to help last time on this pt to see if pt is still cleared from a cardiac standpoint for sx on 06/11/14. I explained that anesthesia is just confirming the clearance since pt was cleared by Dr Ellyn Hack on 04/14/14 but then ended up in the ER after the clearance date. I mentioned that the pt saw the P.A. Patric Dykes after the ER visit and I see where a message was sent to him asking about clearance from [place] but never a response. Ivin Booty said the P.A. Is in the office today and will get back in touch with me for the final clearance answer.

## 2014-06-10 NOTE — Telephone Encounter (Signed)
Calling to see if patient is cleared for sugery Patient is schedule for lap inguinal hernia tomorrow  RN will discuss with extender Lurena Joiner PA and call Lars Mage

## 2014-06-10 NOTE — Progress Notes (Signed)
Anesthesia Chart Review: Patient is a 78 year old male scheduled for upper scopic exploration of the groins with repair right and possibly left inguinal hernias on 06/11/2014 by Dr. Johney Maine.  History includes nonsmoker, hypertension, ischemic cardiomyopathy (as on 05/05/14 not felt to be an ICD candidate due to drug abuse and compliance issues; plan to re-evaluate in 6 months), systolic CHF, CAD, tonsillectomy, sternal fracture surgery, polysubstance abuse (including cocaine and marijuana, last use 12/24/2013). PCP is Dr. Jeanie Cooks.  Cardiologist is Dr. Ellyn Hack. He was previously cleared by cardiology for this surgery on 03/25/14, but was then admitted on 04/26/14 with an acute exacerbation of CHF.  He was seen by Kerin Ransom, [name] on 05/05/14 for follow-up.  EKG on 04/26/14 showed SR, PACs, probable LAE, LVH, borderline QT interval.   Echo on 01/04/14 showed: - Left ventricle: The cavity size was normal. Wall thickness was increased in a pattern of moderate LVH. Systolic function was moderately to severely reduced. The estimated ejection fraction was in the range of 30% to 35%. Global hypokinesis, with more predominant inferolateral hypokinesis. LV diastolic function cannot be assessed due to a-fib. The E/e' ratio, however, is >15, suggesting markedly elevated LV filling pressure. - Mitral valve: Mildly thickened leaflets. Theanterior leaflet is sclerotic. Moderate regurgitation, posteriorly directed. - Left atrium: The atrium was normal in size. - Right ventricle: The cavity size was mildly dilated. - Atrial septum: No defect or patent foramen ovale was identified. - Inferior vena cava: The vessel was normal in size; the respirophasic diameter changes were in the normal range (= 50%); findings are consistent with normal central venous pressure. - Pericardium, extracardiac: There was no pericardial effusion. (Previous EF was 25-30% on 07/07/13.)  Nuclear stress test on 07/07/13 showed: Large old infarct/scar in  the inferior wall. No evidence for significant inducible ischemia. Ejection fraction 29%. Due to abnormal findings, a cardiac cath was done on 07/09/13 and showed: Angiographically normal LAD, D1, D1, LCX, RAMUS.  RCA with no occlusive disease. No evidence of any significant stenosis to explain the patient's inferior infarct. (One consideration would be intense, prolonged spasm [related to recent cocaine use] resulting in RCA infarction with spontaneous reperfusion after resolution of spasm.)  2V CXR on 04/20/14 showed: No active cardiopulmonary disease.  1V CXR on 04/26/14 showed borderline heart size. No active disease.  Preoperative labs noted.  BUN/Cr 32/1.81.  This is up from 17/1.26 on 04/27/14 with previously elevated BUN/Cr of 28/1.71 in 12/2013. Labs are marked as reviewed by Dr. Johney Maine.  I will order an ISTAT 8 on the day of surgery to ensure no further increase.  Since patient had a CHF exacerbation following his initial cardiac clearance, Dr. Johney Maine did contact Dr. Selby Foisy [place] office to ensure that patient was still cleared for surgery.  Nursing staff spoke with Kerin Ransom, [name] with Dr Ellyn Hack.  Per telephone notes from today, "Per Lurena Joiner, verbal order patient is cleared for surgery watch for volume overload due to patient's Ischemic cardiomyopathy."  George [facility] Short Stay Center/Anesthesiology Phone [phone] 06/10/2014 2:18 PM

## 2014-06-10 NOTE — Telephone Encounter (Signed)
Calling to verify the pt truly has cardiac clearance for sx scheduled tomorrow 06/11/14 by Dr Johney Maine. I see the note from Dr Ellyn Hack clearing pt on 04/14/14 but since pt went to the ER after that date anesthesia wants to make sure the clearance is legit. I will call to verify the clearance on pt and call her back.

## 2014-06-11 ENCOUNTER — Encounter (HOSPITAL_COMMUNITY): Payer: Medicare HMO | Admitting: Vascular Surgery

## 2014-06-11 ENCOUNTER — Ambulatory Visit (HOSPITAL_COMMUNITY)
Admission: RE | Admit: 2014-06-11 | Discharge: 2014-06-12 | Disposition: A | Discharge status: Home / Self Care | Payer: Medicare HMO | Source: Ambulatory Visit | Attending: Surgery | Admitting: Surgery

## 2014-06-11 ENCOUNTER — Encounter (HOSPITAL_COMMUNITY)
Admission: RE | Disposition: A | Discharge status: Home / Self Care | Payer: Self-pay | Source: Ambulatory Visit | Attending: Surgery

## 2014-06-11 ENCOUNTER — Encounter (HOSPITAL_COMMUNITY): Payer: Self-pay | Admitting: *Deleted

## 2014-06-11 ENCOUNTER — Ambulatory Visit (HOSPITAL_COMMUNITY): Payer: Medicare HMO | Admitting: Anesthesiology

## 2014-06-11 DIAGNOSIS — K4090 Unilateral inguinal hernia, without obstruction or gangrene, not specified as recurrent: Secondary | ICD-10-CM | POA: Diagnosis present

## 2014-06-11 DIAGNOSIS — I2510 Atherosclerotic heart disease of native coronary artery without angina pectoris: Secondary | ICD-10-CM | POA: Insufficient documentation

## 2014-06-11 DIAGNOSIS — K4120 Bilateral femoral hernia, without obstruction or gangrene, not specified as recurrent: Secondary | ICD-10-CM

## 2014-06-11 DIAGNOSIS — G8929 Other chronic pain: Secondary | ICD-10-CM | POA: Insufficient documentation

## 2014-06-11 DIAGNOSIS — I428 Other cardiomyopathies: Secondary | ICD-10-CM | POA: Diagnosis not present

## 2014-06-11 DIAGNOSIS — N183 Chronic kidney disease, stage 3 unspecified: Secondary | ICD-10-CM | POA: Insufficient documentation

## 2014-06-11 DIAGNOSIS — M545 Low back pain, unspecified: Secondary | ICD-10-CM | POA: Insufficient documentation

## 2014-06-11 DIAGNOSIS — N182 Chronic kidney disease, stage 2 (mild): Secondary | ICD-10-CM | POA: Diagnosis present

## 2014-06-11 DIAGNOSIS — I509 Heart failure, unspecified: Secondary | ICD-10-CM | POA: Insufficient documentation

## 2014-06-11 DIAGNOSIS — Z8719 Personal history of other diseases of the digestive system: Secondary | ICD-10-CM

## 2014-06-11 DIAGNOSIS — Z9889 Other specified postprocedural states: Secondary | ICD-10-CM

## 2014-06-11 DIAGNOSIS — I255 Ischemic cardiomyopathy: Secondary | ICD-10-CM | POA: Diagnosis present

## 2014-06-11 DIAGNOSIS — F1410 Cocaine abuse, uncomplicated: Secondary | ICD-10-CM | POA: Insufficient documentation

## 2014-06-11 DIAGNOSIS — I129 Hypertensive chronic kidney disease with stage 1 through stage 4 chronic kidney disease, or unspecified chronic kidney disease: Secondary | ICD-10-CM | POA: Insufficient documentation

## 2014-06-11 DIAGNOSIS — K429 Umbilical hernia without obstruction or gangrene: Secondary | ICD-10-CM

## 2014-06-11 DIAGNOSIS — K4020 Bilateral inguinal hernia, without obstruction or gangrene, not specified as recurrent: Secondary | ICD-10-CM

## 2014-06-11 HISTORY — DX: Ventricular tachycardia: I47.2

## 2014-06-11 HISTORY — PX: INGUINAL HERNIA REPAIR: SHX194

## 2014-06-11 HISTORY — PX: UMBILICAL HERNIA REPAIR: SHX196

## 2014-06-11 HISTORY — DX: Hypokalemia: E87.6

## 2014-06-11 HISTORY — PX: FEMORAL HERNIA REPAIR: SHX632

## 2014-06-11 LAB — POCT I-STAT, CHEM 8
BUN: 39 mg/dL — AB (ref 6–23)
CALCIUM ION: 1.11 mmol/L — AB (ref 1.13–1.30)
Chloride: 104 mEq/L (ref 96–112)
Creatinine, Ser: 2 mg/dL — ABNORMAL HIGH (ref 0.50–1.35)
Glucose, Bld: 101 mg/dL — ABNORMAL HIGH (ref 70–99)
HEMATOCRIT: 52 % (ref 39.0–52.0)
Hemoglobin: 17.7 g/dL — ABNORMAL HIGH (ref 13.0–17.0)
Potassium: 3.5 mEq/L — ABNORMAL LOW (ref 3.7–5.3)
Sodium: 140 mEq/L (ref 137–147)
TCO2: 24 mmol/L (ref 0–100)

## 2014-06-11 SURGERY — REPAIR, HERNIA, INGUINAL, LAPAROSCOPIC
Anesthesia: General | Site: Groin | Laterality: Right

## 2014-06-11 MED ORDER — 0.9 % SODIUM CHLORIDE (POUR BTL) OPTIME
TOPICAL | Status: DC | PRN
  Administered 2014-06-11 (×2): 1000 mL

## 2014-06-11 MED ORDER — DIPHENHYDRAMINE HCL 50 MG/ML IJ SOLN: 12.5000 mg | Freq: Four times a day (QID) | INTRAMUSCULAR | Status: DC | PRN

## 2014-06-11 MED ORDER — PHENYLEPHRINE HCL 10 MG/ML IJ SOLN
INTRAMUSCULAR | Status: DC | PRN
  Administered 2014-06-11 (×2): 80 ug via INTRAVENOUS
  Administered 2014-06-11: 40 ug via INTRAVENOUS
  Administered 2014-06-11: 80 ug via INTRAVENOUS
  Administered 2014-06-11: 120 ug via INTRAVENOUS

## 2014-06-11 MED ORDER — SODIUM CHLORIDE 0.9 % IJ SOLN: 3.0000 mL | INTRAMUSCULAR | Status: DC | PRN

## 2014-06-11 MED ORDER — BUPIVACAINE-EPINEPHRINE (PF) 0.25% -1:200000 IJ SOLN
INTRAMUSCULAR | Status: AC
  Filled 2014-06-11: qty 30

## 2014-06-11 MED ORDER — SODIUM CHLORIDE 0.9 % IJ SOLN: 3.0000 mL | Freq: Two times a day (BID) | INTRAMUSCULAR | Status: DC

## 2014-06-11 MED ORDER — PHENYLEPHRINE 40 MCG/ML (10ML) SYRINGE FOR IV PUSH (FOR BLOOD PRESSURE SUPPORT)
PREFILLED_SYRINGE | INTRAVENOUS | Status: AC
  Filled 2014-06-11: qty 10

## 2014-06-11 MED ORDER — ALUM & MAG HYDROXIDE-SIMETH 200-200-20 MG/5ML PO SUSP: 30.0000 mL | Freq: Four times a day (QID) | ORAL | Status: DC | PRN

## 2014-06-11 MED ORDER — MIDAZOLAM HCL 2 MG/2ML IJ SOLN
INTRAMUSCULAR | Status: AC
  Filled 2014-06-11: qty 2

## 2014-06-11 MED ORDER — POLYETHYLENE GLYCOL 3350 17 G PO PACK: 17.0000 g | PACK | Freq: Two times a day (BID) | ORAL | Status: DC | PRN

## 2014-06-11 MED ORDER — HYDROCHLOROTHIAZIDE 25 MG PO TABS: 12.5000 mg | ORAL_TABLET | Freq: Two times a day (BID) | ORAL | Status: DC

## 2014-06-11 MED ORDER — PHENOL 1.4 % MT LIQD: 2.0000 | OROMUCOSAL | Status: DC | PRN

## 2014-06-11 MED ORDER — EPHEDRINE SULFATE 50 MG/ML IJ SOLN: INTRAMUSCULAR | Status: DC | PRN

## 2014-06-11 MED ORDER — OXYCODONE HCL 5 MG PO TABS
5.0000 mg | ORAL_TABLET | ORAL | Status: DC | PRN
  Administered 2014-06-12: 10 mg via ORAL
  Filled 2014-06-11: qty 2

## 2014-06-11 MED ORDER — FENTANYL CITRATE 0.05 MG/ML IJ SOLN
INTRAMUSCULAR | Status: AC
  Filled 2014-06-11: qty 5

## 2014-06-11 MED ORDER — LACTATED RINGERS IV SOLN
INTRAVENOUS | Status: DC
  Administered 2014-06-11: 07:00:00 via INTRAVENOUS

## 2014-06-11 MED ORDER — LACTATED RINGERS IV SOLN: INTRAVENOUS | Status: DC

## 2014-06-11 MED ORDER — GLYCOPYRROLATE 0.2 MG/ML IJ SOLN
INTRAMUSCULAR | Status: DC | PRN
  Administered 2014-06-11: .6 mg via INTRAVENOUS

## 2014-06-11 MED ORDER — ROCURONIUM BROMIDE 100 MG/10ML IV SOLN
INTRAVENOUS | Status: DC | PRN
  Administered 2014-06-11: 10 mg via INTRAVENOUS
  Administered 2014-06-11: 40 mg via INTRAVENOUS

## 2014-06-11 MED ORDER — NEOSTIGMINE METHYLSULFATE 10 MG/10ML IV SOLN
INTRAVENOUS | Status: DC | PRN
  Administered 2014-06-11: 4 mg via INTRAVENOUS

## 2014-06-11 MED ORDER — ONDANSETRON HCL 4 MG/2ML IJ SOLN
4.0000 mg | Freq: Four times a day (QID) | INTRAMUSCULAR | Status: DC
  Administered 2014-06-12 (×2): 4 mg via INTRAVENOUS
  Filled 2014-06-11 (×2): qty 2

## 2014-06-11 MED ORDER — OXYCODONE HCL 5 MG PO TABS
ORAL_TABLET | ORAL | Status: AC
  Filled 2014-06-11: qty 1

## 2014-06-11 MED ORDER — PROPOFOL 10 MG/ML IV BOLUS
INTRAVENOUS | Status: AC
  Filled 2014-06-11: qty 20

## 2014-06-11 MED ORDER — LIDOCAINE HCL (CARDIAC) 20 MG/ML IV SOLN
INTRAVENOUS | Status: DC | PRN
  Administered 2014-06-11: 80 mg via INTRAVENOUS

## 2014-06-11 MED ORDER — FENTANYL CITRATE 0.05 MG/ML IJ SOLN: 25.0000 ug | INTRAMUSCULAR | Status: DC | PRN

## 2014-06-11 MED ORDER — CARVEDILOL 12.5 MG PO TABS
ORAL_TABLET | ORAL | Status: AC
  Administered 2014-06-11: 12.5 mg
  Filled 2014-06-11: qty 1

## 2014-06-11 MED ORDER — LACTATED RINGERS IV SOLN
INTRAVENOUS | Status: DC | PRN
  Administered 2014-06-11 (×2): via INTRAVENOUS

## 2014-06-11 MED ORDER — MAGIC MOUTHWASH: 15.0000 mL | Freq: Four times a day (QID) | ORAL | Status: DC | PRN

## 2014-06-11 MED ORDER — ARTIFICIAL TEARS OP OINT
TOPICAL_OINTMENT | OPHTHALMIC | Status: DC | PRN
  Administered 2014-06-11: 1 via OPHTHALMIC

## 2014-06-11 MED ORDER — BLISTEX MEDICATED EX OINT
TOPICAL_OINTMENT | Freq: Two times a day (BID) | CUTANEOUS | Status: DC
  Administered 2014-06-11 – 2014-06-12 (×2): via TOPICAL
  Filled 2014-06-11: qty 10

## 2014-06-11 MED ORDER — PROPOFOL 10 MG/ML IV BOLUS
INTRAVENOUS | Status: DC | PRN
  Administered 2014-06-11: 120 mg via INTRAVENOUS
  Administered 2014-06-11: 30 mg via INTRAVENOUS

## 2014-06-11 MED ORDER — HYDROMORPHONE HCL PF 1 MG/ML IJ SOLN
INTRAMUSCULAR | Status: AC
  Administered 2014-06-11: 0.5 mg via INTRAVENOUS
  Filled 2014-06-11: qty 1

## 2014-06-11 MED ORDER — LACTATED RINGERS IV BOLUS (SEPSIS): 1000.0000 mL | Freq: Three times a day (TID) | INTRAVENOUS | Status: DC | PRN

## 2014-06-11 MED ORDER — SODIUM CHLORIDE 0.9 % IV SOLN: 250.0000 mL | INTRAVENOUS | Status: DC | PRN

## 2014-06-11 MED ORDER — ONDANSETRON HCL 4 MG/2ML IJ SOLN
INTRAMUSCULAR | Status: DC | PRN
  Administered 2014-06-11: 4 mg via INTRAVENOUS

## 2014-06-11 MED ORDER — ROCURONIUM BROMIDE 50 MG/5ML IV SOLN
INTRAVENOUS | Status: AC
  Filled 2014-06-11: qty 1

## 2014-06-11 MED ORDER — FUROSEMIDE 40 MG PO TABS
40.0000 mg | ORAL_TABLET | Freq: Two times a day (BID) | ORAL | Status: DC
  Administered 2014-06-12: 40 mg via ORAL
  Filled 2014-06-11 (×3): qty 1

## 2014-06-11 MED ORDER — SODIUM CHLORIDE 0.9 % IR SOLN
Status: DC | PRN
  Administered 2014-06-11: 1000 mL

## 2014-06-11 MED ORDER — HYDROMORPHONE HCL PF 1 MG/ML IJ SOLN
INTRAMUSCULAR | Status: AC
  Filled 2014-06-11: qty 1

## 2014-06-11 MED ORDER — GLYCOPYRROLATE 0.2 MG/ML IJ SOLN
INTRAMUSCULAR | Status: AC
  Filled 2014-06-11: qty 3

## 2014-06-11 MED ORDER — MENTHOL 3 MG MT LOZG: 1.0000 | LOZENGE | OROMUCOSAL | Status: DC | PRN

## 2014-06-11 MED ORDER — FENTANYL CITRATE 0.05 MG/ML IJ SOLN
INTRAMUSCULAR | Status: DC | PRN
  Administered 2014-06-11 (×3): 50 ug via INTRAVENOUS

## 2014-06-11 MED ORDER — OXYCODONE HCL 5 MG PO TABS
5.0000 mg | ORAL_TABLET | Freq: Once | ORAL | Status: AC | PRN
  Administered 2014-06-11: 5 mg via ORAL

## 2014-06-11 MED ORDER — HYDROCHLOROTHIAZIDE 12.5 MG PO CAPS
12.5000 mg | ORAL_CAPSULE | Freq: Two times a day (BID) | ORAL | Status: DC
  Administered 2014-06-11 – 2014-06-12 (×2): 12.5 mg via ORAL
  Filled 2014-06-11 (×3): qty 1

## 2014-06-11 MED ORDER — LIDOCAINE HCL (CARDIAC) 20 MG/ML IV SOLN
INTRAVENOUS | Status: AC
  Filled 2014-06-11: qty 5

## 2014-06-11 MED ORDER — CARVEDILOL 12.5 MG PO TABS: 12.5000 mg | ORAL_TABLET | Freq: Two times a day (BID) | ORAL | Status: DC

## 2014-06-11 MED ORDER — ONDANSETRON 4 MG PO TBDP
4.0000 mg | ORAL_TABLET | Freq: Four times a day (QID) | ORAL | Status: DC | PRN
  Filled 2014-06-11: qty 2

## 2014-06-11 MED ORDER — FUROSEMIDE 40 MG PO TABS: 40.0000 mg | ORAL_TABLET | Freq: Two times a day (BID) | ORAL | Status: DC

## 2014-06-11 MED ORDER — OXYCODONE HCL 5 MG/5ML PO SOLN: 5.0000 mg | Freq: Once | ORAL | Status: AC | PRN

## 2014-06-11 MED ORDER — MORPHINE SULFATE 2 MG/ML IJ SOLN: 1.0000 mg | INTRAMUSCULAR | Status: DC | PRN

## 2014-06-11 MED ORDER — ALBUTEROL SULFATE (2.5 MG/3ML) 0.083% IN NEBU: 2.5000 mg | INHALATION_SOLUTION | RESPIRATORY_TRACT | Status: DC | PRN

## 2014-06-11 MED ORDER — PHENYLEPHRINE HCL 10 MG/ML IJ SOLN
10.0000 mg | INTRAMUSCULAR | Status: DC | PRN
  Administered 2014-06-11 (×2): 40 ug/min via INTRAVENOUS

## 2014-06-11 MED ORDER — BUPIVACAINE-EPINEPHRINE 0.25% -1:200000 IJ SOLN
INTRAMUSCULAR | Status: DC | PRN
  Administered 2014-06-11 (×2): 30 mL

## 2014-06-11 MED ORDER — SODIUM CHLORIDE 0.9 % IJ SOLN
3.0000 mL | INTRAMUSCULAR | Status: DC | PRN
  Administered 2014-06-12: 3 mL via INTRAVENOUS

## 2014-06-11 MED ORDER — LIP MEDEX EX OINT
1.0000 "application " | TOPICAL_OINTMENT | Freq: Two times a day (BID) | CUTANEOUS | Status: DC
  Filled 2014-06-11: qty 7

## 2014-06-11 MED ORDER — ACETAMINOPHEN 500 MG PO TABS
1000.0000 mg | ORAL_TABLET | Freq: Three times a day (TID) | ORAL | Status: DC
  Administered 2014-06-11 – 2014-06-12 (×2): 1000 mg via ORAL
  Filled 2014-06-11 (×3): qty 2

## 2014-06-11 MED ORDER — CHLORHEXIDINE GLUCONATE 4 % EX LIQD: 1.0000 "application " | Freq: Once | CUTANEOUS | Status: DC

## 2014-06-11 MED ORDER — LISINOPRIL 10 MG PO TABS
10.0000 mg | ORAL_TABLET | Freq: Every day | ORAL | Status: DC
  Administered 2014-06-12: 10 mg via ORAL
  Filled 2014-06-11: qty 1

## 2014-06-11 MED ORDER — BISACODYL 10 MG RE SUPP: 10.0000 mg | Freq: Two times a day (BID) | RECTAL | Status: DC | PRN

## 2014-06-11 MED ORDER — ONDANSETRON HCL 4 MG/2ML IJ SOLN
INTRAMUSCULAR | Status: AC
  Filled 2014-06-11: qty 2

## 2014-06-11 MED ORDER — HYDROMORPHONE HCL PF 1 MG/ML IJ SOLN
0.2500 mg | INTRAMUSCULAR | Status: DC | PRN
  Administered 2014-06-11 (×3): 0.5 mg via INTRAVENOUS

## 2014-06-11 MED ORDER — PROMETHAZINE HCL 25 MG/ML IJ SOLN: 6.2500 mg | INTRAMUSCULAR | Status: DC | PRN

## 2014-06-11 MED ORDER — CARVEDILOL 12.5 MG PO TABS
12.5000 mg | ORAL_TABLET | Freq: Two times a day (BID) | ORAL | Status: DC
  Administered 2014-06-12: 12.5 mg via ORAL
  Filled 2014-06-11 (×3): qty 1

## 2014-06-11 MED ORDER — OXYCODONE HCL 5 MG PO TABS: 5.0000 mg | ORAL_TABLET | ORAL | Status: DC | PRN

## 2014-06-11 MED ORDER — SODIUM CHLORIDE 0.9 % IJ SOLN
3.0000 mL | Freq: Two times a day (BID) | INTRAMUSCULAR | Status: DC
  Administered 2014-06-12: 3 mL via INTRAVENOUS

## 2014-06-11 MED ORDER — PROMETHAZINE HCL 25 MG/ML IJ SOLN: 6.2500 mg | Freq: Four times a day (QID) | INTRAMUSCULAR | Status: DC | PRN

## 2014-06-11 MED ORDER — OXYCODONE-ACETAMINOPHEN 5-325 MG PO TABS
2.0000 | ORAL_TABLET | ORAL | Status: DC | PRN
  Administered 2014-06-12: 2 via ORAL
  Filled 2014-06-11 (×2): qty 2

## 2014-06-11 MED ORDER — NEOSTIGMINE METHYLSULFATE 10 MG/10ML IV SOLN
INTRAVENOUS | Status: AC
  Filled 2014-06-11: qty 1

## 2014-06-11 MED ORDER — METOPROLOL TARTRATE 1 MG/ML IV SOLN: 5.0000 mg | Freq: Four times a day (QID) | INTRAVENOUS | Status: DC | PRN

## 2014-06-11 SURGICAL SUPPLY — 47 items
APPLIER CLIP 5 13 M/L LIGAMAX5 (MISCELLANEOUS)
CANISTER SUCTION 2500CC (MISCELLANEOUS) ×5 IMPLANT
CHLORAPREP W/TINT 26ML (MISCELLANEOUS) ×5 IMPLANT
CLIP APPLIE 5 13 M/L LIGAMAX5 (MISCELLANEOUS) IMPLANT
COVER SURGICAL LIGHT HANDLE (MISCELLANEOUS) ×5 IMPLANT
DRAPE UTILITY 15X26 W/TAPE STR (DRAPE) ×10 IMPLANT
DRAPE WARM FLUID 44X44 (DRAPE) ×5 IMPLANT
DRSG TEGADERM 4X4.75 (GAUZE/BANDAGES/DRESSINGS) ×5 IMPLANT
ELECT REM PT RETURN 9FT ADLT (ELECTROSURGICAL) ×5
ELECTRODE REM PT RTRN 9FT ADLT (ELECTROSURGICAL) ×3 IMPLANT
FLOSHIELD STORZ OLYMP 10MM 30 (MISCELLANEOUS) ×5 IMPLANT
GAUZE SPONGE 2X2 8PLY STRL LF (GAUZE/BANDAGES/DRESSINGS) ×3 IMPLANT
GLOVE BIOGEL PI IND STRL 7.0 (GLOVE) ×9 IMPLANT
GLOVE BIOGEL PI IND STRL 7.5 (GLOVE) ×3 IMPLANT
GLOVE BIOGEL PI IND STRL 8 (GLOVE) ×3 IMPLANT
GLOVE BIOGEL PI INDICATOR 7.0 (GLOVE) ×6
GLOVE BIOGEL PI INDICATOR 7.5 (GLOVE) ×2
GLOVE BIOGEL PI INDICATOR 8 (GLOVE) ×2
GLOVE ECLIPSE 7.5 STRL STRAW (GLOVE) ×5 IMPLANT
GLOVE ECLIPSE 8.0 STRL XLNG CF (GLOVE) ×5 IMPLANT
GLOVE SS BIOGEL STRL SZ 7.5 (GLOVE) ×3 IMPLANT
GLOVE SUPERSENSE BIOGEL SZ 7.5 (GLOVE) ×2
GLOVE SURG SS PI 7.0 STRL IVOR (GLOVE) ×15 IMPLANT
GOWN STRL REUS W/ TWL LRG LVL3 (GOWN DISPOSABLE) ×9 IMPLANT
GOWN STRL REUS W/ TWL XL LVL3 (GOWN DISPOSABLE) ×3 IMPLANT
GOWN STRL REUS W/TWL LRG LVL3 (GOWN DISPOSABLE) ×6
GOWN STRL REUS W/TWL XL LVL3 (GOWN DISPOSABLE) ×2
KIT BASIN OR (CUSTOM PROCEDURE TRAY) ×5 IMPLANT
KIT ROOM TURNOVER OR (KITS) ×5 IMPLANT
MESH ULTRAPRO 6X6 15CM15CM (Mesh General) ×10 IMPLANT
NEEDLE 22X1 1/2 (OR ONLY) (NEEDLE) ×5 IMPLANT
NS IRRIG 1000ML POUR BTL (IV SOLUTION) ×5 IMPLANT
PAD ARMBOARD 7.5X6 YLW CONV (MISCELLANEOUS) ×10 IMPLANT
SCISSORS LAP 5X35 DISP (ENDOMECHANICALS) ×5 IMPLANT
SET IRRIG TUBING LAPAROSCOPIC (IRRIGATION / IRRIGATOR) IMPLANT
SLEEVE ENDOPATH XCEL 5M (ENDOMECHANICALS) ×5 IMPLANT
SPONGE GAUZE 2X2 STER 10/PKG (GAUZE/BANDAGES/DRESSINGS) ×2
SUT MNCRL AB 4-0 PS2 18 (SUTURE) ×5 IMPLANT
SUT PDS AB 0 CT 36 (SUTURE) ×15 IMPLANT
SUT VIC AB 3-0 SH 27 (SUTURE) ×2
SUT VIC AB 3-0 SH 27XBRD (SUTURE) ×3 IMPLANT
TOWEL OR 17X24 6PK STRL BLUE (TOWEL DISPOSABLE) ×5 IMPLANT
TOWEL OR 17X26 10 PK STRL BLUE (TOWEL DISPOSABLE) ×5 IMPLANT
TRAY FOLEY CATH 16FR SILVER (SET/KITS/TRAYS/PACK) IMPLANT
TRAY LAPAROSCOPIC (CUSTOM PROCEDURE TRAY) ×5 IMPLANT
TROCAR XCEL BLUNT TIP 100MML (ENDOMECHANICALS) ×5 IMPLANT
TROCAR XCEL NON-BLD 5MMX100MML (ENDOMECHANICALS) ×5 IMPLANT

## 2014-06-11 NOTE — Anesthesia Preprocedure Evaluation (Addendum)
Anesthesia Evaluation  Patient identified by MRN, date of birth, ID band Patient awake    Reviewed: Allergy & Precautions, H&P , NPO status , Patient's Chart, lab work & pertinent test results  Airway Mallampati: II      Dental  (+) Teeth Intact, Dental Advisory Given   Pulmonary shortness of breath,          Cardiovascular hypertension, + angina + CAD and +CHF     Neuro/Psych    GI/Hepatic   Endo/Other    Renal/GU Renal InsufficiencyRenal disease     Musculoskeletal   Abdominal   Peds  Hematology   Anesthesia Other Findings   Reproductive/Obstetrics                         Anesthesia Physical Anesthesia Plan  ASA: IV  Anesthesia Plan: General   Post-op Pain Management:    Induction: Intravenous  Airway Management Planned: Oral ETT  Additional Equipment: Arterial line  Intra-op Plan:   Post-operative Plan: Extubation in OR  Informed Consent: I have reviewed the patients History and Physical, chart, labs and discussed the procedure including the risks, benefits and alternatives for the proposed anesthesia with the patient or authorized representative who has indicated his/her understanding and acceptance.   Dental advisory given  Plan Discussed with:   Anesthesia Plan Comments:         Anesthesia Quick Evaluation

## 2014-06-11 NOTE — Op Note (Signed)
06/11/2014  12:28 PM  PATIENT:  Billy Parker  78 y.o. male  Patient Care Team: Philis Fendt, MD as PCP - General (Internal Medicine) Leonie Man, MD as Consulting Physician (Cardiology)  PRE-OPERATIVE DIAGNOSIS:  right inguinal hernia  POST-OPERATIVE DIAGNOSIS:    Right inguinal hernia - indirect Bilateral femoral hernias Umbilical hernia  PROCEDURE:  Procedure(s): LAPAROSCOPIC RIGHT INGUINAL HERNIA WITH MESH  LAPAROSCOPIC BILATERAL FEMORAL HERNIA REPAIRS WITH MESH PRIMARY UMBILICAL HERNIA REPAIR  SURGEON:  Surgeon(s): Adin Hector, MD  ASSISTANT: RN   ANESTHESIA:   local and general  EBL:  Total I/O In: 1000 [I.V.:1000] Out: -   Delay start of Pharmacological VTE agent (>24hrs) due to surgical blood loss or risk of bleeding:  no  DRAINS: none   SPECIMEN:  Source of Specimen:  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  INDICATION: Pleasant elderly recovering cocaine addict with obvious Right inguinal hernia.  History of left inguinal hernia repair in the past.  Cardiomyopathy with occasional exacerbations with cocaine.  Absent.  Ejection fraction 35%.  Cleared for surgery.  I recommend laparoscopic exploration and repair of hernia(s)  The anatomy & physiology of the abdominal wall and pelvic floor was discussed.  The pathophysiology of hernias in the inguinal and pelvic region was discussed.  Natural history risks such as progressive enlargement, pain, incarceration & strangulation was discussed.   Contributors to complications such as smoking, obesity, diabetes, prior surgery, etc were discussed.    I feel the risks of no intervention will lead to serious problems that outweigh the operative risks; therefore, I recommended surgery to reduce and repair the hernia.  I explained laparoscopic techniques with possible need for  an open approach.  I noted usual use of mesh to patch and/or buttress hernia repair  Risks such as bleeding, infection, abscess, need for further treatment, heart attack, death, and other risks were discussed.  I noted a good likelihood this will help address the problem.   Goals of post-operative recovery were discussed as well.  Possibility that this will not correct all symptoms was explained.  I stressed the importance of low-impact activity, aggressive pain control, avoiding constipation, & not pushing through pain to minimize risk of post-operative chronic pain or injury. Possibility of reherniation was discussed.  We will work to minimize complications.     An educational handout further explaining the pathology & treatment options was given as well.  Questions were answered.  The patient expresses understanding & wishes to proceed with surgery.  OR FINDINGS: The patient had a large indirect inguinal hernia in with the large spermatic cord lipoma.  Mild laxity direct space but not hernia there.  Obvious femoral hernia as well.  On the left, he had no recurrent inguinal hernia.  However, he did have a femoral hernia with fat within it.  He had a 1.5 cm umbilical hernia was in the stalk.  Omentum within it but not incarcerated.  DESCRIPTION:   The patient was identified & brought into the operating room. The patient was positioned supine with arms tucked. SCDs were active during the entire case. The patient underwent general anesthesia without any difficulty.  The abdomen was prepped and draped in a sterile fashion. The patient's bladder was emptied.  A Surgical Timeout confirmed our plan.  I made a transverse incision through the inferior umbilical  fold.  I made a small transverse nick through the anterior rectus fascia contralateral to the inguinal hernia side and placed a 0-vicryl stitch through the fascia.  I placed a Hasson trocar into the preperitoneal plane.  Entry was clean.  We induced  carbon dioxide insufflation. Camera inspection revealed no injury.  I used a 46mm angled scope to bluntly free the peritoneum off the infraumbilical anterior abdominal wall.  I created enough of a preperitoneal pocket to place 107mm ports into the right & left mid-abdomen into this preperitoneal cavity.  I focused attention on the right side since that was the dominant hernia side.   I used blunt & focused sharp dissection to free the peritoneum off the flank and down to the pubic rim.  I freed the anteriolateral bladder wall off the anteriolateral pelvic wall, sparing midline attachments.   I located a swath of peritoneum going into a hernia fascial defect at the internal ring consistent with a large indirect hernia.  I gradually freed the peritoneal hernia sac off safely and reduced it into the preperitoneal space.  He had a very large spermatic cord lipoma associated with it.  I carefully freed it off.  It was later morcellated and removed.  I freed the peritoneum off the spermatic vessels & vas deferens.  I freed peritoneum off the retroperitoneum along the psoas muscle.    I checked & assured hemostasis.  The patient had an obvious femoral hernia as well with fat going up into it.  Carefully skeletonized and reduced it down.  No obturator hernia.  Mild bulging indirect space but no true hernia there.  I turned attention on the opposite side.  I did dissection in a similar, mirror-image fashion. The patient had a Femoral hernia on that side.  He did have some hernia sac up in the proximal inguinal canal but no true recurrent hernia.      In freeing off the hernia sacs I did have a breech in the peritoneum.  Was located at the large right indirect hernia sac.   I closed that using absorbable Vicryl stitch using laparoscopic intracorporeal suturing.  This help reduce the large hernia sac and provided an excellent high ligation as well.  I saw no breaches in the peritoneum elsewhere.  I chose 15x15 cm sheets  of ultra-lightweight polypropylene mesh (Ultrapro), one for each side.  I cut a single sigmoid-shaped slit ~6cm from a corner of each mesh.  I placed the meshes into the preperitoneal space & laid them as overlapping diamonds such that at the inferior points, a 6x6 cm corner flap rested in the true anterolateral pelvis, covering the obturator & femoral foramina.   I allowed the bladder to fall back and help tuck the corners of the mesh in.  The medial corners overlapped each other across midline cephalad to the pubic rim.   This provided >2 inch coverage around the hernia.  Because the defects well covered and not particularly large, I did not need tacks to hold the mesh in place.    I held the hernia sacs cephalad & evacuated carbon dioxide.  I closed the fascia with absorbable suture.  Because the patient had an obvious umbilical hernia, proceeded with repair.  I freed the umbilical stalk off the anterior fascia.  I encountered omentum within a hernia sac.  I reduced that down.  I primarily closed the umbilical hernia using interrupted 0 PDS suturing since it was less than 2 cm.  I reattached the  umbilical stalk to the fascia.  I closed the skin using 4-0 monocryl stitch.  Sterile dressings were applied. The patient was extubated & arrived in the PACU in stable condition..  I had discussed postoperative care with the patient in the holding area.  There is no one here to discuss postoperative findings.   Instructions are written in the chart.  He does have some history of mild cardiomyopathy, but he did well hemodynamically.  He wished to home if possible.  We will follow and recovering and see if he can make those goals.

## 2014-06-11 NOTE — OR Nursing (Signed)
Arterial line discontinued by pacu protocol.  Angiocath removed intact and manual pressure for 10 mins until hemastasis achieved.  Gauze pressure dressing applied.

## 2014-06-11 NOTE — Transfer of Care (Signed)
Immediate Anesthesia Transfer of Care Note  Patient: Billy Parker  Procedure(s) Performed: Procedure(s): LAPAROSCOPIC RIGHT INGUINAL HERNIA WITH MESH  (Right) LAPAROSCOPIC BILATERAL FEMORAL HERNIA REPAIR WITH MESH (Bilateral) PRIMARY UMBILICAL HERNIA REPAIR (N/A)  Patient Location: PACU  Anesthesia Type:General  Level of Consciousness: awake, oriented and sedated  Airway & Oxygen Therapy: Patient Spontanous Breathing and Patient connected to nasal cannula oxygen  Post-op Assessment: Report given to PACU RN, Post -op Vital signs reviewed and stable and Patient moving all extremities  Post vital signs: Reviewed and stable  Complications: No apparent anesthesia complications

## 2014-06-11 NOTE — Progress Notes (Signed)
Patient unable to give name of person who would be staying with him tonight at home.  Spoke with his caregiver, Ronny Bacon?  And was told that the patient lives in a rooming house.  Patient cannot go back to rooming house, per anesthesia.  Called [facility] MD Dr. Brantley Stage.  Orders obtained to admit patient for observation.

## 2014-06-11 NOTE — Progress Notes (Signed)
Report called to 6n to receiving RN
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
## 2014-06-11 NOTE — Discharge Instructions (Signed)
HERNIA REPAIR: POST OP INSTRUCTIONS  1. DIET: Follow a light bland diet the first 24 hours after arrival home, such as soup, liquids, crackers, etc.  Be sure to include lots of fluids daily.  Avoid fast food or heavy meals as your are more likely to get nauseated.  Eat a low fat the next few days after surgery. 2. Take your usually prescribed home medications unless otherwise directed. 3. PAIN CONTROL: a. Pain is best controlled by a usual combination of three different methods TOGETHER: i. Ice/Heat ii. Over the counter pain medication iii. Prescription pain medication b. Most patients will experience some swelling and bruising around the hernia(s) such as the bellybutton, groins, or old incisions.  Ice packs or heating pads (30-60 minutes up to 6 times a day) will help. Use ice for the first few days to help decrease swelling and bruising, then switch to heat to help relax tight/sore spots and speed recovery.  Some people prefer to use ice alone, heat alone, alternating between ice & heat.  Experiment to what works for you.  Swelling and bruising can take several weeks to resolve.   i. It is helpful to take an over-the-counter pain medication regularly for the first few weeks.  Choose Acetaminophen (Tylenol, etc) 325-633m four times a day (every meal & bedtime) c. A  prescription for pain medication should be given to you upon discharge.  Take your pain medication as prescribed.  i. If you are having problems/concerns with the prescription medicine (does not control pain, nausea, vomiting, rash, itching, etc), please call [place]([phone] see if we need to switch you to a different pain medicine that will work better for you and/or control your side effect better. ii. If you need a refill on your pain medication, please contact your pharmacy.  They will contact our office to request authorization. Prescriptions will not be filled after 5 pm or on week-ends. 4. Avoid getting constipated.  Between  the surgery and the pain medications, it is common to experience some constipation.  Increasing fluid intake and taking a fiber supplement (such as Metamucil, Citrucel, FiberCon, MiraLax, etc) 1-2 times a day regularly will usually help prevent this problem from occurring.  A mild laxative (prune juice, Milk of Magnesia, MiraLax, etc) should be taken according to package directions if there are no bowel movements after 48 hours.   5. Wash / shower every day.  You may shower over the dressings as they are waterproof.   6. Remove your waterproof bandages 5 days after surgery.  You may leave the incision open to air.  You may replace a dressing/Band-Aid to cover the incision for comfort if you wish.  Continue to shower over incision(s) after the dressing is off.    7. ACTIVITIES as tolerated:   a. You may resume regular (light) daily activities beginning the next day--such as daily self-care, walking, climbing stairs--gradually increasing activities as tolerated.  If you can walk 30 minutes without difficulty, it is safe to try more intense activity such as jogging, treadmill, bicycling, low-impact aerobics, swimming, etc. b. Save the most intensive and strenuous activity for last such as sit-ups, heavy lifting, contact sports, etc  Refrain from any heavy lifting or straining until you are off narcotics for pain control.   c. DO NOT PUSH THROUGH PAIN.  Let pain be your guide: If it hurts to do something, don't do it.  Pain is your body warning you to avoid that activity for another week until  the pain goes down. d. You may drive when you are no longer taking prescription pain medication, you can comfortably wear a seatbelt, and you can safely maneuver your car and apply brakes. e. Dennis Bast may have sexual intercourse when it is comfortable.  8. FOLLOW UP in our office a. Please call [facility] at [phone] to set up an appointment to see your surgeon in the office for a follow-up appointment approximately 2-3  weeks after your surgery. b. Make sure that you call for this appointment the day you arrive home to insure a convenient appointment time. 9.  IF YOU HAVE DISABILITY OR FAMILY LEAVE FORMS, BRING THEM TO THE OFFICE FOR PROCESSING.  DO NOT GIVE THEM TO YOUR DOCTOR.  WHEN TO CALL us [phone]: 1. Poor pain control 2. Reactions / problems with new medications (rash/itching, nausea, etc)  3. Fever over 101.5 F (38.5 C) 4. Inability to urinate 5. Nausea and/or vomiting 6. Worsening swelling or bruising 7. Continued bleeding from incision. 8. Increased pain, redness, or drainage from the incision   The clinic staff is available to answer your questions during regular business hours (8:30am-5pm).  Please dont hesitate to call and ask to speak to one of our nurses for clinical concerns.   If you have a medical emergency, go to the nearest emergency room or call 911.  A surgeon from [facility] is always on call at the hospitals in [facility], [place], [place], [place], [place]  [address] ?  [address] [address], [place], [place]   [address] MAIN: [phone] ? TOLL FREE: [phone] ? FAX: [phone] [email or web address]  Cardiomyopathy Cardiomyopathy means a disease of the heart muscle. The heart muscle becomes enlarged or stiff. The heart is not able to pump enough blood or deliver enough oxygen to the body. This leads to heart failure and is the number one reason for heart transplants.  TYPES OF CARDIOMYOPATHY INCLUDE: DILATED  The most common type. The heart muscle is stretched out and weak so there is less blood pumped out.   Some causes:  Disease of the arteries of the heart (ischemia).  Heart attack with muscle scar.  Leaky or damaged valves.  After a viral illness.  Smoking.  High cholesterol.  Diabetes or overactive thyroid.  Alcohol or drug abuse.  High blood pressure.  May be  reversible. HYPERTROPHIC The heart muscle grows bigger so there is less room for blood in the ventricle, and not enough blood is pumped out.   Causes include:  Mitral valve leaks.  Inherited tendency (from your family).  No explanation (idiopathic).  May be a cause of sudden death in young athletes with no symptoms. RESTRICTIVE The heart muscle becomes stiff, but not always larger. The heart has to work harder and will get weaker. Abnormal heart beats or rhythm (arrhythmia) are common.  Some causes:  Diseases in other parts of the body which may produce abnormal deposits in the heart muscle.  Probably not inherited.  A result of radiation treatment for cancer. SYMPTOMS OF ALL TYPES:  Less able to exercise or tolerate physical activity.  Palpitations.  Irregular heart beat, heart arrhythmias.  Shortness of breath, even at rest.  Chest pain.  Lightheadedness or fainting. TREATMENT  Life-style changes including reducing salt, lowering cholesterol, stop smoking.  Manage contributing causes with medications.  Medicines to help reduce the fluids in the body.  An implanted cardioverter defibrillator (ICD) to improve heart function and correct  arrhythmias.  Medications to relax the blood vessels and make it easier for the heart to pump.  Drugs that help regulate heart beat and improve heart relaxation, reducing the work of the heart.  Myomectomy for patients with hypertrophic cardiomyopathy and severe problems. This is a surgical procedure that removes a portion of the thickened muscle wall in order to improve heart output and provide symptom relief.  A heart transplant is an option in carefully applied circumstances. SEEK IMMEDIATE MEDICAL CARE IF:   You have severe chest pain, especially if the pain is crushing or pressure-like and spreads to the arms, back, neck, or jaw, or if you have sweating, feeling sick to your stomach (nausea), or shortness of breath. THIS IS  AN EMERGENCY. Do not wait to see if the pain will go away. Get medical help at once. Call your local emergency services (911 in [place] DO NOT drive yourself to the hospital.  You develop severe shortness of breath.  You begin to cough up bloody sputum.  You are unable to sleep because you cannot breathe.  You gain weight due to fluid retention.  You develop painful swelling in your calf or leg.  You feel your heart racing and it does not go away or happens when you are resting. Document Released: 03/01/2005 Document Revised: 03/10/2012 Document Reviewed: 08/04/2008 [facility] Patient Information 2014 [place].  Exercise to Stay Healthy Exercise helps you become and stay healthy. EXERCISE IDEAS AND TIPS Choose exercises that:  You enjoy.  Fit into your day. You do not need to exercise really hard to be healthy. You can do exercises at a slow or medium level and stay healthy. You can:  Stretch before and after working out.  Try yoga, Pilates, or tai chi.  Lift weights.  Walk fast, swim, jog, run, climb stairs, bicycle, dance, or rollerskate.  Take aerobic classes. Exercises that burn about 150 calories:  Running 1  miles in 15 minutes.  Playing volleyball for 45 to 60 minutes.  Washing and waxing a car for 45 to 60 minutes.  Playing touch football for 45 minutes.  Walking 1  miles in 35 minutes.  Pushing a stroller 1  miles in 30 minutes.  Playing basketball for 30 minutes.  Raking leaves for 30 minutes.  Bicycling 5 miles in 30 minutes.  Walking 2 miles in 30 minutes.  Dancing for 30 minutes.  Shoveling snow for 15 minutes.  Swimming laps for 20 minutes.  Walking up stairs for 15 minutes.  Bicycling 4 miles in 15 minutes.  Gardening for 30 to 45 minutes.  Jumping rope for 15 minutes.  Washing windows or floors for 45 to 60 minutes. Document Released: 01/19/2011 Document Revised: 03/10/2012 Document Reviewed: 01/19/2011 [facility] Patient  Information 2014 [place], [place]. What to eat:  For your first meals, you should eat lightly; only small meals initially.  If you do not have nausea, you may eat larger meals.  Avoid spicy, greasy and heavy food.    General Anesthesia, Adult, Care After  Refer to this sheet in the next few weeks. These instructions provide you with information on caring for yourself after your procedure. Your health care provider may also give you more specific instructions. Your treatment has been planned according to current medical practices, but problems sometimes occur. Call your health care provider if you have any problems or questions after your procedure.  WHAT TO EXPECT AFTER THE PROCEDURE  After the procedure, it is typical to experience:  Sleepiness.  Nausea and vomiting. HOME CARE INSTRUCTIONS  For the first 24 hours after general anesthesia:  Have a responsible person with you.  Do not drive a car. If you are alone, do not take public transportation.  Do not drink alcohol.  Do not take medicine that has not been prescribed by your health care provider.  Do not sign important papers or make important decisions.  You may resume a normal diet and activities as directed by your health care provider.  Change bandages (dressings) as directed.  If you have questions or problems that seem related to general anesthesia, call the hospital and ask for the anesthetist or anesthesiologist on call. SEEK MEDICAL CARE IF:  You have nausea and vomiting that continue the day after anesthesia.  You develop a rash. SEEK IMMEDIATE MEDICAL CARE IF:  You have difficulty breathing.  You have chest pain.  You have any allergic problems. Document Released: 03/25/2001 Document Revised: 08/19/2013 Document Reviewed: 07/02/2013  [facility] Patient Information 2014 [place], [place].

## 2014-06-12 DIAGNOSIS — K4090 Unilateral inguinal hernia, without obstruction or gangrene, not specified as recurrent: Secondary | ICD-10-CM | POA: Diagnosis not present

## 2014-06-12 LAB — CBC
HCT: 45.3 % (ref 39.0–52.0)
HEMOGLOBIN: 15.7 g/dL (ref 13.0–17.0)
MCH: 30.3 pg (ref 26.0–34.0)
MCHC: 34.7 g/dL (ref 30.0–36.0)
MCV: 87.5 fL (ref 78.0–100.0)
Platelets: 133 10*3/uL — ABNORMAL LOW (ref 150–400)
RBC: 5.18 MIL/uL (ref 4.22–5.81)
RDW: 13.5 % (ref 11.5–15.5)
WBC: 5.4 10*3/uL (ref 4.0–10.5)

## 2014-06-12 LAB — BASIC METABOLIC PANEL
BUN: 25 mg/dL — ABNORMAL HIGH (ref 6–23)
CO2: 21 meq/L (ref 19–32)
CREATININE: 1.37 mg/dL — AB (ref 0.50–1.35)
Calcium: 9.1 mg/dL (ref 8.4–10.5)
Chloride: 99 mEq/L (ref 96–112)
GFR calc Af Amer: 57 mL/min — ABNORMAL LOW (ref >90)
GFR calc non Af Amer: 49 mL/min — ABNORMAL LOW (ref >90)
GLUCOSE: 109 mg/dL — AB (ref 70–99)
Potassium: 3.9 mEq/L (ref 3.7–5.3)
Sodium: 134 mEq/L — ABNORMAL LOW (ref 137–147)

## 2014-06-12 MED ORDER — ACETAMINOPHEN 500 MG PO TABS: 1000.0000 mg | ORAL_TABLET | Freq: Three times a day (TID) | ORAL | Status: DC

## 2014-06-12 NOTE — Progress Notes (Signed)
Patient up and dressed. IV removed per order. Discharge instructions and prescription given. Had 10mg  of Oxy IR prior to release with instructions not to take any more until 7PM tonight. Social Worker in, but patient already has ride home. Melford Aase, RN

## 2014-06-12 NOTE — Progress Notes (Addendum)
1 Day Post-Op  Subjective: Lives alone, better with some pain medicine.  Port sites OK  Objective: Vital signs in last 24 hours: Temp:  [97.1 F (36.2 C)-98.2 F (36.8 C)] 97.3 F (36.3 C) (06/13 1000) Pulse Rate:  [42-83] 66 (06/13 1000) Resp:  [8-19] 17 (06/13 1000) BP: (118-167)/(73-95) 118/88 mmHg (06/13 1000) SpO2:  [90 %-100 %] 99 % (06/13 1000) Arterial Line BP: (160)/(72) 160/72 mmHg (06/12 1345) Weight:  [72.485 kg (159 lb 12.8 oz)] 72.485 kg (159 lb 12.8 oz) (06/12 1722) Last BM Date: 06/11/14 480 PO  Clears so far. Afebrile, VSS K+ 3.5  -  6/12 Creatinine 2.0 yesterday Intake/Output from previous day: 06/12 0701 - 06/13 0700 In: 2606.7 [P.O.:480; I.V.:2126.7] Out: 1125 [Urine:1125] Intake/Output this shift: Total I/O In: 240 [P.O.:240] Out: 1450 [Urine:1450]  General appearance: alert, cooperative and no distress Resp: clear to auscultation bilaterally Cardio: regular rate and rhythm, S1, S2 normal, no murmur, click, rub or gallop GI: Soft sore few Bs, Port sites dressing intact.  few BS Extremities: no edema  Lab Results:   Recent Labs  06/09/14 1407 06/11/14 0705 06/12/14 0911  WBC 5.0  --  5.4  HGB 15.4 17.7* 15.7  HCT 43.3 52.0 45.3  PLT 146*  --  133*    BMET  Recent Labs  06/09/14 1407 06/11/14 0705 06/12/14 0911  NA 132* 140 134*  K 4.1 3.5* 3.9  CL 93* 104 99  CO2 22  --  21  GLUCOSE 101* 101* 109*  BUN 32* 39* 25*  CREATININE 1.81* 2.00* 1.37*  CALCIUM 9.4  --  9.1   PT/INR No results found for this basename: LABPROT, INR,  in the last 72 hours   Recent Labs Lab 06/09/14 1407  AST 39*  ALT 26  ALKPHOS 66  BILITOT 0.5  PROT 7.9  ALBUMIN 3.3*     Lipase  No results found for this basename: lipase     Studies/Results: No results found.  Medications: . acetaminophen  1,000 mg Oral TID  . carvedilol  12.5 mg Oral BID WC  . furosemide  40 mg Oral BID  . hydrochlorothiazide  12.5 mg Oral BID  . lip balm   Topical  BID  . lisinopril  10 mg Oral Daily  . ondansetron  4 mg Intravenous Q6H  . sodium chloride  3 mL Intravenous Q12H   Prior to Admission medications   Medication Sig Start Date End Date Taking? Authorizing Provider  albuterol (PROVENTIL HFA;VENTOLIN HFA) 108 (90 BASE) MCG/ACT inhaler Inhale into the lungs every 4 (four) hours as needed for wheezing or shortness of breath.   Yes Historical Provider, MD  carvedilol (COREG) 12.5 MG tablet Take 1 tablet (12.5 mg total) by mouth 2 (two) times daily with a meal. 04/29/14  Yes Charlynne Cousins, MD  furosemide (LASIX) 40 MG tablet Take 40 mg by mouth 2 (two) times daily.   Yes Historical Provider, MD  furosemide (LASIX) 40 MG tablet TAKE 1 TABLET (40 MG TOTAL) BY MOUTH 2 (TWO) TIMES DAILY. 06/02/14  Yes Leonie Man, MD  hydrochlorothiazide (HYDRODIURIL) 12.5 MG tablet Take 12.5 mg by mouth 2 (two) times daily.   Yes Historical Provider, MD  lisinopril (PRINIVIL,ZESTRIL) 10 MG tablet Take 1 tablet (10 mg total) by mouth daily. 04/29/14  Yes Charlynne Cousins, MD  oxyCODONE (OXY IR/ROXICODONE) 5 MG immediate release tablet Take 1-2 tablets (5-10 mg total) by mouth every 4 (four) hours as needed for moderate pain,  severe pain or breakthrough pain. 06/11/14   Adin Hector, MD     Assessment/Plan Right inguinal hernia - indirect, Bilateral femoral hernias, Umbilical hernia, 66/44/0347,  Adin Hector, MD Hx of cocaine use CM EF 25-35% Jan 2015, with exacerbations January and April 2015 Nonobstructive CAD Lives in boarding home Hypertension Renal insuffiencey  Not on DVT rx.  Plan:  I am going to recheck his labs, saline lock IV and if he is able to eat and ambulate safely we will discuss sending him home. He is doing well with PO's up in room some, labs updated in note and are OK.  I am trying to get him some help to go home.  No one in this state to assist him.      LOS: 1 day    Faria Casella 06/12/2014

## 2014-06-12 NOTE — Progress Notes (Signed)
Discharged via wheelchair to front entrance where ride was waiting. Escorted by NT. Melford Aase, RN

## 2014-06-12 NOTE — Progress Notes (Signed)
I have seen and examined the pt and agree with PA-Jenning's progress note. Doing well post op. Likely home later today or tomorrow.

## 2014-06-12 NOTE — Progress Notes (Signed)
Clinical Education officer, museum (CSW) received call from RN stating that patient needs a taxi voucher home. CSW met with RN and patient who confirmed that patient arranged a ride to come pick him up around 3 pm. Please reconsult if further social work needs arise. CSW signing off.   Blima Rich, [place] Weekend CSW [phone]

## 2014-06-14 ENCOUNTER — Encounter (HOSPITAL_COMMUNITY): Payer: Self-pay | Admitting: Surgery

## 2014-06-14 NOTE — Discharge Summary (Signed)
Physician Discharge Summary  Patient ID: Billy Parker MRN: [id number] DOB/AGE: 04-14-1936 78 y.o.  Admit date: 06/11/2014 Discharge date: 06/14/2014  Admission Diagnoses:  Discharge Diagnoses:  Principal Problem:   Inguinal hernia, right s/p lap repair w mesh 06/11/2014 Active Problems:   Cardiomyopathy, non ischemic, EF 30-35%   CKD (chronic kidney disease) stage 3, GFR 30-59 ml/min   Femoral hernia, bilateral - s/p lap repair w mesh 6/96/2952   Umbilical hernia s/p primary repair 06/11/2014   S/P inguinal hernia repair   Discharged Condition: good  Hospital Course: Patient underwent repair of inguinal femoral and umbilical hernias.  Do not have a ride home.  Was kept overnight.  Postoperatively, the patient mobilized in the hallways and advanced to a solid diet gradually.  Pain was well-controlled and transitioned off IV medications.    By the time of discharge, the patient was walking well the hallways, eating food well, having flatus.  Pain was-controlled on an oral regimen.  Based on meeting DC criteria and recovering well, I felt it was safe for the patient to be discharged home with close followup.  Instructions were discussed in detail.  They are written as well.     Consults: None  Significant Diagnostic Studies:   Treatments:   Discharge Exam: Blood pressure 109/65, pulse 70, temperature 98.4 F (36.9 C), temperature source Axillary, resp. rate 17, height 5\' 6"  (1.676 m), weight 159 lb 12.8 oz (72.485 kg), SpO2 96.00%.  General: Pt awake/alert/oriented x4 in no major acute distress Eyes: PERRL, normal EOM. Sclera nonicteric Neuro: CN II-XII intact w/o focal sensory/motor deficits. Lymph: No head/neck/groin lymphadenopathy Psych:  No delerium/psychosis/paranoia HENT: Normocephalic, Mucus membranes moist.  No thrush Neck: Supple, No tracheal deviation Chest: No pain.  Good respiratory excursion. CV:  Pulses intact.  Regular rhythm MS: Normal AROM mjr joints.  No  obvious deformity Abdomen: Soft, Nondistended.  Min tender at clean dressings.  No incarcerated hernias. Ext:  SCDs BLE.  No significant edema.  No cyanosis Skin: No petechiae / purpura   Disposition: 01-Home or Self Care  Discharge Instructions   Call MD for:  extreme fatigue    Complete by:  As directed      Call MD for:  hives    Complete by:  As directed      Call MD for:  persistant nausea and vomiting    Complete by:  As directed      Call MD for:  redness, tenderness, or signs of infection (pain, swelling, redness, odor or green/yellow discharge around incision site)    Complete by:  As directed      Call MD for:  severe uncontrolled pain    Complete by:  As directed      Call MD for:    Complete by:  As directed   Temperature > 101.55F     Diet - low sodium heart healthy    Complete by:  As directed      Discharge instructions    Complete by:  As directed   Please see discharge instruction sheets.  Also refer to handout given an office.  Please call our office if you have any questions or concerns [phone]     Discharge wound care:    Complete by:  As directed   If you have closed incisions, shower and bathe over these incisions with soap and water every day.  Remove all surgical dressings on postoperative day #3.  You do not need to replace dressings over  the closed incisions unless you feel more comfortable with a Band-Aid covering it.   If you have an open wound that requires packing, please see wound care instructions.  In general, remove all dressings, wash wound with soap and water and then replace with saline moistened gauze.  Do the dressing change at least every day.  Please call our office [phone] if you have further questions.     Driving Restrictions    Complete by:  As directed   No driving until off narcotics and can safely swerve away without pain during an emergency     Increase activity slowly    Complete by:  As directed   Walk an hour a day.   Use 20-30 minute walks.  When you can walk 30 minutes without difficulty, increase to low impact/moderate activities such as biking, jogging, swimming, sexual activity..  Eventually can increase to unrestricted activity when not feeling pain.  If you feel pain: STOP!Marland Kitchen   Let pain protect you from overdoing it.  Use ice/heat/over-the-counter pain medications to help minimize his soreness.  Use pain prescriptions as needed to remain active.  It is better to take extra pain medications and be more active than to stay bedridden to avoid all pain medications.     Lifting restrictions    Complete by:  As directed   Avoid heavy lifting initially.  Do not push through pain.  You have no specific weight limit.  Coughing and sneezing or four more stressful to your incision than any lifting you will do. Pain will protect you from injury.  Therefore, avoid intense activity until off all narcotic pain medications.  Coughing and sneezing or four more stressful to your incision than any lifting he will do.     May shower / Bathe    Complete by:  As directed      May walk up steps    Complete by:  As directed      Sexual Activity Restrictions    Complete by:  As directed   Sexual activity as tolerated.  Do not push through pain.  Pain will protect you from injury.     Walk with assistance    Complete by:  As directed   Walk over an hour a day.  May use a walker/cane/companion to help with balance and stamina.            Medication List         acetaminophen 500 MG tablet  Commonly known as:  TYLENOL  Take 2 tablets (1,000 mg total) by mouth 3 (three) times daily.     albuterol 108 (90 BASE) MCG/ACT inhaler  Commonly known as:  PROVENTIL HFA;VENTOLIN HFA  Inhale into the lungs every 4 (four) hours as needed for wheezing or shortness of breath.     carvedilol 12.5 MG tablet  Commonly known as:  COREG  Take 1 tablet (12.5 mg total) by mouth 2 (two) times daily with a meal.     furosemide 40 MG tablet   Commonly known as:  LASIX  Take 40 mg by mouth 2 (two) times daily.     furosemide 40 MG tablet  Commonly known as:  LASIX  TAKE 1 TABLET (40 MG TOTAL) BY MOUTH 2 (TWO) TIMES DAILY.     hydrochlorothiazide 12.5 MG tablet  Commonly known as:  HYDRODIURIL  Take 12.5 mg by mouth 2 (two) times daily.     lisinopril 10 MG tablet  Commonly known as:  PRINIVIL,ZESTRIL  Take 1 tablet (10 mg total) by mouth daily.     oxyCODONE 5 MG immediate release tablet  Commonly known as:  Oxy IR/ROXICODONE  Take 1-2 tablets (5-10 mg total) by mouth every 4 (four) hours as needed for moderate pain, severe pain or breakthrough pain.           Follow-up Information   Follow up with Skyleen Bentley C., MD. Schedule an appointment as soon as possible for a visit in 3 weeks. (To follow up after your operation, To follow up after your hospital stay)    Specialty:  General Surgery   Contact information:   [place] [address] [phone]       Follow up with Erlene Quan, [name]. Schedule an appointment as soon as possible for a visit in 1 month.   Specialty:  Cardiology   Contact information:   [address] [place] [address] [phone]       Signed: Adin Hector 06/14/2014, 7:36 AM

## 2014-06-21 NOTE — Anesthesia Postprocedure Evaluation (Signed)
  Anesthesia Post-op Note  Patient: Billy Parker  Procedure(s) Performed: Procedure(s): LAPAROSCOPIC RIGHT INGUINAL HERNIA WITH MESH  (Right) LAPAROSCOPIC BILATERAL FEMORAL HERNIA REPAIR WITH MESH (Bilateral) PRIMARY UMBILICAL HERNIA REPAIR (N/A)  Patient Location: PACU  Anesthesia Type:General  Level of Consciousness: awake and alert   Airway and Oxygen Therapy: Patient Spontanous Breathing  Post-op Pain: moderate  Post-op Assessment: Post-op Vital signs reviewed  Post-op Vital Signs: stable  Last Vitals:  Filed Vitals:   06/12/14 1328  BP: 109/65  Pulse: 70  Temp: 36.9 C  Resp: 17    Complications: No apparent anesthesia complications

## 2014-06-22 ENCOUNTER — Encounter (INDEPENDENT_AMBULATORY_CARE_PROVIDER_SITE_OTHER): Payer: Medicare HMO | Admitting: Surgery

## 2014-06-30 ENCOUNTER — Encounter (INDEPENDENT_AMBULATORY_CARE_PROVIDER_SITE_OTHER): Payer: Self-pay | Admitting: Surgery

## 2014-06-30 ENCOUNTER — Ambulatory Visit (INDEPENDENT_AMBULATORY_CARE_PROVIDER_SITE_OTHER): Payer: Medicare HMO | Admitting: Surgery

## 2014-06-30 VITALS — BP 160/80 | HR 48 | Temp 97.4°F | Resp 12 | Ht 66.0 in | Wt 153.8 lb

## 2014-06-30 DIAGNOSIS — K429 Umbilical hernia without obstruction or gangrene: Secondary | ICD-10-CM

## 2014-06-30 DIAGNOSIS — K4090 Unilateral inguinal hernia, without obstruction or gangrene, not specified as recurrent: Secondary | ICD-10-CM

## 2014-06-30 DIAGNOSIS — K4120 Bilateral femoral hernia, without obstruction or gangrene, not specified as recurrent: Secondary | ICD-10-CM

## 2014-06-30 MED ORDER — OXYCODONE HCL 5 MG PO TABS: 5.0000 mg | ORAL_TABLET | Freq: Four times a day (QID) | ORAL | Status: DC | PRN

## 2014-06-30 NOTE — Patient Instructions (Signed)
HERNIA REPAIR: POST OP INSTRUCTIONS  1. DIET: Follow a light bland diet the first 24 hours after arrival home, such as soup, liquids, crackers, etc.  Be sure to include lots of fluids daily.  Avoid fast food or heavy meals as your are more likely to get nauseated.  Eat a low fat the next few days after surgery. 2. Take your usually prescribed home medications unless otherwise directed. 3. PAIN CONTROL: a. Pain is best controlled by a usual combination of three different methods TOGETHER: i. Ice/Heat ii. Over the counter pain medication iii. Prescription pain medication b. Most patients will experience some swelling and bruising around the hernia(s) such as the bellybutton, groins, or old incisions.  Ice packs or heating pads (30-60 minutes up to 6 times a day) will help. Use ice for the first few days to help decrease swelling and bruising, then switch to heat to help relax tight/sore spots and speed recovery.  Some people prefer to use ice alone, heat alone, alternating between ice & heat.  Experiment to what works for you.  Swelling and bruising can take several weeks to resolve.   c. It is helpful to take an over-the-counter pain medication regularly for the first few weeks.  Choose one of the following that works best for you: i. Naproxen (Aleve, etc)  Two 220mg tabs twice a day ii. Ibuprofen (Advil, etc) Three 200mg tabs four times a day (every meal & bedtime) iii. Acetaminophen (Tylenol, etc) 325-650mg four times a day (every meal & bedtime) d. A  prescription for pain medication should be given to you upon discharge.  Take your pain medication as prescribed.  i. If you are having problems/concerns with the prescription medicine (does not control pain, nausea, vomiting, rash, itching, etc), please call us [phone] to see if we need to switch you to a different pain medicine that will work better for you and/or control your side effect better. ii. If you need a refill on your pain  medication, please contact your pharmacy.  They will contact our office to request authorization. Prescriptions will not be filled after 5 pm or on week-ends. 4. Avoid getting constipated.  Between the surgery and the pain medications, it is common to experience some constipation.  Increasing fluid intake and taking a fiber supplement (such as Metamucil, Citrucel, FiberCon, MiraLax, etc) 1-2 times a day regularly will usually help prevent this problem from occurring.  A mild laxative (prune juice, Milk of Magnesia, MiraLax, etc) should be taken according to package directions if there are no bowel movements after 48 hours.   5. Wash / shower every day.  You may shower over the dressings as they are waterproof.   6. Remove your waterproof bandages 5 days after surgery.  You may leave the incision open to air.  You may replace a dressing/Band-Aid to cover the incision for comfort if you wish.  Continue to shower over incision(s) after the dressing is off.    7. ACTIVITIES as tolerated:   a. You may resume regular (light) daily activities beginning the next day-such as daily self-care, walking, climbing stairs-gradually increasing activities as tolerated.  If you can walk 30 minutes without difficulty, it is safe to try more intense activity such as jogging, treadmill, bicycling, low-impact aerobics, swimming, etc. b. Save the most intensive and strenuous activity for last such as sit-ups, heavy lifting, contact sports, etc  Refrain from any heavy lifting or straining until you are off narcotics for pain control.     c. DO NOT PUSH THROUGH PAIN.  Let pain be your guide: If it hurts to do something, don't do it.  Pain is your body warning you to avoid that activity for another week until the pain goes down. d. You may drive when you are no longer taking prescription pain medication, you can comfortably wear a seatbelt, and you can safely maneuver your car and apply brakes. e. Dennis Bast may have sexual intercourse  when it is comfortable.  8. FOLLOW UP in our office a. Please call [facility] at [phone] to set up an appointment to see your surgeon in the office for a follow-up appointment approximately 2-3 weeks after your surgery. b. Make sure that you call for this appointment the day you arrive home to insure a convenient appointment time. 9.  IF YOU HAVE DISABILITY OR FAMILY LEAVE FORMS, BRING THEM TO THE OFFICE FOR PROCESSING.  DO NOT GIVE THEM TO YOUR DOCTOR.  WHEN TO CALL us [phone]: 1. Poor pain control 2. Reactions / problems with new medications (rash/itching, nausea, etc)  3. Fever over 101.5 F (38.5 C) 4. Inability to urinate 5. Nausea and/or vomiting 6. Worsening swelling or bruising 7. Continued bleeding from incision. 8. Increased pain, redness, or drainage from the incision   The clinic staff is available to answer your questions during regular business hours (8:30am-5pm).  Please don't hesitate to call and ask to speak to one of our nurses for clinical concerns.   If you have a medical emergency, go to the nearest emergency room or call 911.  A surgeon from [facility] is always on call at the hospitals in [facility], [place], [place], [place], [place]  [address] ?  [address] [address], [place],    [address] MAIN: [id number] ? TOLL FREE: [phone] ? FAX: [phone] [email or web address]  Managing Pain  Pain after surgery or related to activity is often due to strain/injury to muscle, tendon, nerves and/or incisions.  This pain is usually short-term and will improve in a few months.   Many people find it helpful to do the following things TOGETHER to help speed the process of healing and to get back to regular activity more quickly:  1. Avoid heavy physical activity a.  no lifting greater than 20 pounds b. Do not "push through" the pain.  Listen to your body and avoid positions and maneuvers than  reproduce the pain c. Walking is okay as tolerated, but go slowly and stop when getting sore.  d. Remember: If it hurts to do it, then don't do it! 2. Take Anti-inflammatory medication  a. Take with food/snack around the clock for 1-2 weeks i. This helps the muscle and nerve tissues become less irritable and calm down faster b. Choose ONE of the following over-the-counter medications: i. Naproxen 257m tabs (ex. Aleve) 1-2 pills twice a day  ii. Ibuprofen 2044mtabs (ex. Advil, Motrin) 3-4 pills with every meal and just before bedtime iii. Acetaminophen 50075mabs (Tylenol) 1-2 pills with every meal and just before bedtime 3. Use a Heating pad or Ice/Cold Pack a. 4-6 times a day b. May use warm bath/hottub  or showers 4. Try Gentle Massage and/or Stretching  a. at the area of pain many times a day b. stop if you feel pain - do not overdo it  Try these steps together to help you body heal faster and avoid making things get worse.  Doing just one of these  things may not be enough.    If you are not getting better after two weeks or are noticing you are getting worse, contact our office for further advice; we may need to re-evaluate you & see what other things we can do to help.  Hematoma A hematoma is a collection of blood under the skin, in an organ, in a body space, in a joint space, or in other tissue. The blood can clot to form a lump that you can see and feel. The lump is often firm and may sometimes become sore and tender. Most hematomas get better in a few days to weeks. However, some hematomas may be serious and require medical care. Hematomas can range in size from very small to very large. CAUSES  A hematoma can be caused by a blunt or penetrating injury. It can also be caused by spontaneous leakage from a blood vessel under the skin. Spontaneous leakage from a blood vessel is more likely to occur in older people, especially those taking blood thinners. Sometimes, a hematoma can  develop after certain medical procedures. SIGNS AND SYMPTOMS   A firm lump on the body.  Possible pain and tenderness in the area.  Bruising.Blue, dark blue, purple-red, or yellowish skin may appear at the site of the hematoma if the hematoma is close to the surface of the skin. For hematomas in deeper tissues or body spaces, the signs and symptoms may be subtle. For example, an intra-abdominal hematoma may cause abdominal pain, weakness, fainting, and shortness of breath. An intracranial hematoma may cause a headache or symptoms such as weakness, trouble speaking, or a change in consciousness. DIAGNOSIS  A hematoma can usually be diagnosed based on your medical history and a physical exam. Imaging tests may be needed if your health care provider suspects a hematoma in deeper tissues or body spaces, such as the abdomen, head, or chest. These tests may include ultrasonography or a CT scan.  TREATMENT  Hematomas usually go away on their own over time. Rarely does the blood need to be drained out of the body. Large hematomas or those that may affect vital organs will sometimes need surgical drainage or monitoring. HOME CARE INSTRUCTIONS   Apply ice to the injured area:   Put ice in a plastic bag.   Place a towel between your skin and the bag.   Leave the ice on for 20 minutes, 2-3 times a day for the first 1 to 2 days.   After the first 2 days, switch to using warm compresses on the hematoma.   Elevate the injured area to help decrease pain and swelling. Wrapping the area with an elastic bandage may also be helpful. Compression helps to reduce swelling and promotes shrinking of the hematoma. Make sure the bandage is not wrapped too tight.   If your hematoma is on a lower extremity and is painful, crutches may be helpful for a couple days.   Only take over-the-counter or prescription medicines as directed by your health care provider. SEEK IMMEDIATE MEDICAL CARE IF:   You have  increasing pain, or your pain is not controlled with medicine.   You have a fever.   You have worsening swelling or discoloration.   Your skin over the hematoma breaks or starts bleeding.   Your hematoma is in your chest or abdomen and you have weakness, shortness of breath, or a change in consciousness.  Your hematoma is on your scalp (caused by a fall or injury) and you  have a worsening headache or a change in alertness or consciousness. MAKE SURE YOU:   Understand these instructions.  Will watch your condition.  Will get help right away if you are not doing well or get worse. Document Released: 07/31/2004 Document Revised: 08/19/2013 Document Reviewed: 05/27/2013 [facility] Patient Information 2015 [place], [place]. This information is not intended to replace advice given to you by your health care provider. Make sure you discuss any questions you have with your health care provider.
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
## 2014-07-06 ENCOUNTER — Telehealth (INDEPENDENT_AMBULATORY_CARE_PROVIDER_SITE_OTHER): Payer: Self-pay

## 2014-07-06 NOTE — Telephone Encounter (Signed)
Called pt to make f/u appt with Dr Johney [place] for 8/5 arrive 9:00/9:15. Pt understands.

## 2014-07-06 NOTE — Telephone Encounter (Signed)
Message copied by Illene Regulus on Tue Jul 06, 2014 11:03 AM ------      Message from: [place], [place]: Wed Jun 30, 2014 12:11 PM      Regarding: Appt Needed      Contact: [phone]       Pt is to be seen in (3) wks..could not find any openings.      Thank you...df ------

## 2014-08-04 ENCOUNTER — Encounter (INDEPENDENT_AMBULATORY_CARE_PROVIDER_SITE_OTHER): Payer: Medicare HMO | Admitting: Surgery

## 2014-08-18 ENCOUNTER — Encounter (INDEPENDENT_AMBULATORY_CARE_PROVIDER_SITE_OTHER): Payer: Medicare HMO | Admitting: Surgery

## 2014-08-25 ENCOUNTER — Encounter (INDEPENDENT_AMBULATORY_CARE_PROVIDER_SITE_OTHER): Payer: Self-pay | Admitting: Surgery

## 2014-09-27 ENCOUNTER — Emergency Department (HOSPITAL_COMMUNITY)
Admission: EM | Admit: 2014-09-27 | Discharge: 2014-09-27 | Disposition: A | Discharge status: Home / Self Care | Payer: Medicare HMO | Attending: Emergency Medicine | Admitting: Emergency Medicine

## 2014-09-27 ENCOUNTER — Encounter (HOSPITAL_COMMUNITY): Payer: Self-pay | Admitting: Emergency Medicine

## 2014-09-27 ENCOUNTER — Emergency Department (HOSPITAL_COMMUNITY): Payer: Medicare HMO

## 2014-09-27 DIAGNOSIS — Z79899 Other long term (current) drug therapy: Secondary | ICD-10-CM | POA: Diagnosis not present

## 2014-09-27 DIAGNOSIS — M25539 Pain in unspecified wrist: Secondary | ICD-10-CM | POA: Insufficient documentation

## 2014-09-27 DIAGNOSIS — Z862 Personal history of diseases of the blood and blood-forming organs and certain disorders involving the immune mechanism: Secondary | ICD-10-CM | POA: Insufficient documentation

## 2014-09-27 DIAGNOSIS — I10 Essential (primary) hypertension: Secondary | ICD-10-CM | POA: Diagnosis not present

## 2014-09-27 DIAGNOSIS — I509 Heart failure, unspecified: Secondary | ICD-10-CM | POA: Insufficient documentation

## 2014-09-27 DIAGNOSIS — Z9861 Coronary angioplasty status: Secondary | ICD-10-CM | POA: Diagnosis not present

## 2014-09-27 DIAGNOSIS — Z951 Presence of aortocoronary bypass graft: Secondary | ICD-10-CM | POA: Insufficient documentation

## 2014-09-27 DIAGNOSIS — M129 Arthropathy, unspecified: Secondary | ICD-10-CM | POA: Diagnosis not present

## 2014-09-27 DIAGNOSIS — G8929 Other chronic pain: Secondary | ICD-10-CM | POA: Diagnosis not present

## 2014-09-27 DIAGNOSIS — I2510 Atherosclerotic heart disease of native coronary artery without angina pectoris: Secondary | ICD-10-CM | POA: Insufficient documentation

## 2014-09-27 DIAGNOSIS — Z8674 Personal history of sudden cardiac arrest: Secondary | ICD-10-CM | POA: Diagnosis not present

## 2014-09-27 DIAGNOSIS — Z9889 Other specified postprocedural states: Secondary | ICD-10-CM | POA: Insufficient documentation

## 2014-09-27 DIAGNOSIS — Z8639 Personal history of other endocrine, nutritional and metabolic disease: Secondary | ICD-10-CM | POA: Diagnosis not present

## 2014-09-27 DIAGNOSIS — I5021 Acute systolic (congestive) heart failure: Secondary | ICD-10-CM | POA: Insufficient documentation

## 2014-09-27 DIAGNOSIS — M25531 Pain in right wrist: Secondary | ICD-10-CM

## 2014-09-27 DIAGNOSIS — M67431 Ganglion, right wrist: Secondary | ICD-10-CM

## 2014-09-27 DIAGNOSIS — M6740 Ganglion, unspecified site: Secondary | ICD-10-CM | POA: Insufficient documentation

## 2014-09-27 MED ORDER — HYDROCODONE-ACETAMINOPHEN 5-325 MG PO TABS: 1.0000 | ORAL_TABLET | ORAL | Status: DC | PRN

## 2014-09-27 NOTE — ED Provider Notes (Signed)
Medical screening examination/treatment/procedure(s) were performed by non-physician practitioner and as supervising physician I was immediately available for consultation/collaboration.   EKG Interpretation None        Evelina Bucy, MD 09/27/14 1625
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
## 2014-09-27 NOTE — ED Notes (Signed)
Declined W/C at D/C and was escorted to lobby by RN. 

## 2014-09-27 NOTE — ED Provider Notes (Signed)
Medical screening examination/treatment/procedure(s) were performed by non-physician practitioner and as supervising physician I was immediately available for consultation/collaboration.   EKG Interpretation None        Evelina Bucy, MD 09/27/14 1624

## 2014-09-27 NOTE — ED Notes (Signed)
Pt has hx of hurting right wrist and noticed a swollen spot on it as well. Pt states he has a hx of arthritis and thinks that may be it but not sure.
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
## 2014-09-27 NOTE — Discharge Instructions (Signed)

## 2014-10-14 ENCOUNTER — Emergency Department (HOSPITAL_COMMUNITY): Payer: Medicare HMO

## 2014-10-14 ENCOUNTER — Encounter (HOSPITAL_COMMUNITY): Payer: Self-pay | Admitting: Emergency Medicine

## 2014-10-14 ENCOUNTER — Emergency Department (HOSPITAL_COMMUNITY)
Admission: EM | Admit: 2014-10-14 | Discharge: 2014-10-14 | Disposition: A | Discharge status: Home / Self Care | Payer: Medicare HMO | Attending: Emergency Medicine | Admitting: Emergency Medicine

## 2014-10-14 DIAGNOSIS — Z79899 Other long term (current) drug therapy: Secondary | ICD-10-CM | POA: Diagnosis not present

## 2014-10-14 DIAGNOSIS — Z9889 Other specified postprocedural states: Secondary | ICD-10-CM | POA: Insufficient documentation

## 2014-10-14 DIAGNOSIS — I10 Essential (primary) hypertension: Secondary | ICD-10-CM | POA: Insufficient documentation

## 2014-10-14 DIAGNOSIS — G8929 Other chronic pain: Secondary | ICD-10-CM | POA: Diagnosis not present

## 2014-10-14 DIAGNOSIS — I2510 Atherosclerotic heart disease of native coronary artery without angina pectoris: Secondary | ICD-10-CM | POA: Diagnosis not present

## 2014-10-14 DIAGNOSIS — I5021 Acute systolic (congestive) heart failure: Secondary | ICD-10-CM | POA: Insufficient documentation

## 2014-10-14 DIAGNOSIS — M2012 Hallux valgus (acquired), left foot: Secondary | ICD-10-CM | POA: Diagnosis not present

## 2014-10-14 DIAGNOSIS — M21612 Bunion of left foot: Secondary | ICD-10-CM

## 2014-10-14 DIAGNOSIS — M79672 Pain in left foot: Secondary | ICD-10-CM | POA: Diagnosis not present

## 2014-10-14 MED ORDER — HYDROCODONE-ACETAMINOPHEN 5-325 MG PO TABS: 1.0000 | ORAL_TABLET | Freq: Four times a day (QID) | ORAL | Status: DC | PRN

## 2014-10-14 MED ORDER — HYDROCODONE-ACETAMINOPHEN 5-325 MG PO TABS
2.0000 | ORAL_TABLET | Freq: Once | ORAL | Status: AC
  Administered 2014-10-14: 2 via ORAL
  Filled 2014-10-14: qty 2

## 2014-10-14 MED ORDER — ONDANSETRON HCL 4 MG PO TABS: 4.0000 mg | ORAL_TABLET | Freq: Four times a day (QID) | ORAL | Status: DC

## 2014-10-14 MED ORDER — ONDANSETRON 4 MG PO TBDP
8.0000 mg | ORAL_TABLET | Freq: Once | ORAL | Status: AC
  Administered 2014-10-14: 8 mg via ORAL
  Filled 2014-10-14: qty 2

## 2014-10-14 NOTE — ED Notes (Signed)
Pt reports swelling and pain to left foot x 4 days. No obvious sign of infection to foot. Denies hx of diabetes. Denies injury. Pt in NAD.

## 2014-10-14 NOTE — ED Notes (Signed)
Declined W/C at D/C and was escorted to lobby by RN. 
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
## 2014-10-14 NOTE — Discharge Instructions (Signed)

## 2014-10-15 NOTE — ED Provider Notes (Signed)
Medical screening examination/treatment/procedure(s) were conducted as a shared visit with non-physician practitioner(s) and myself.  I personally evaluated the patient during the encounter.   EKG Interpretation None      Billy Parker is a 78 y.o. male here with L foot pain and swelling. Denies trauma. Denies fever or calf pain. On exam, no calf tenderness. Diffuse swelling of the foot. No obvious cellulitis. Minimal tenderness 5th MTP joint. No obvious gout on exam and no hx of gout. Xray showed probable bunion. Will d/c home with pain meds. Will hold steroids as I am not convinced its gout.    Wandra Arthurs, MD 10/15/14 [phone]

## 2014-12-09 ENCOUNTER — Encounter (HOSPITAL_COMMUNITY): Payer: Self-pay | Admitting: Cardiology

## 2015-01-11 IMAGING — CR DG CHEST 2V
2 series · 2 of 2 positions shown · non-contrast
Comparison: 01/03/2014

CLINICAL DATA: Chest pain and short of breath

EXAM:
CHEST  2 VIEW

[w chest pa]
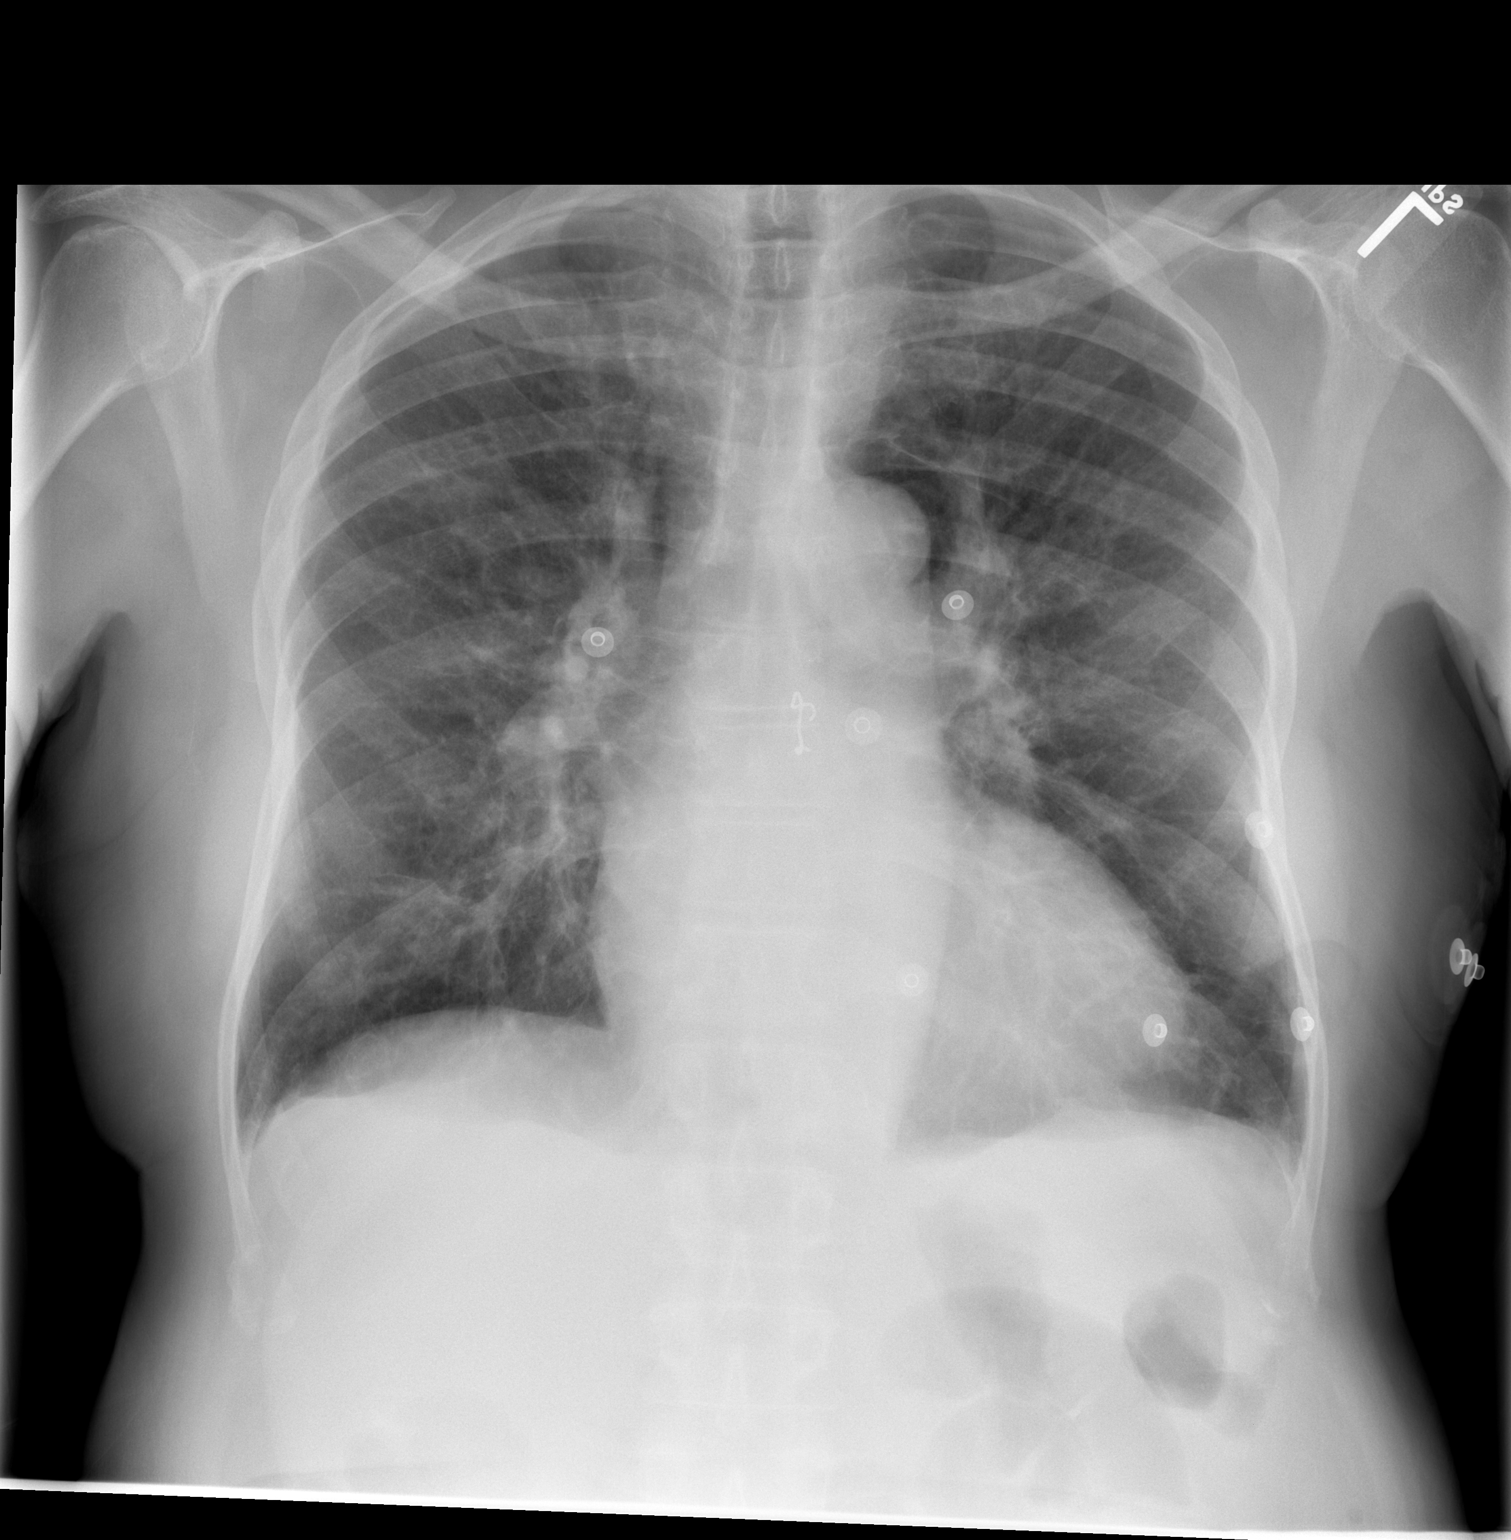

[w chest lat]
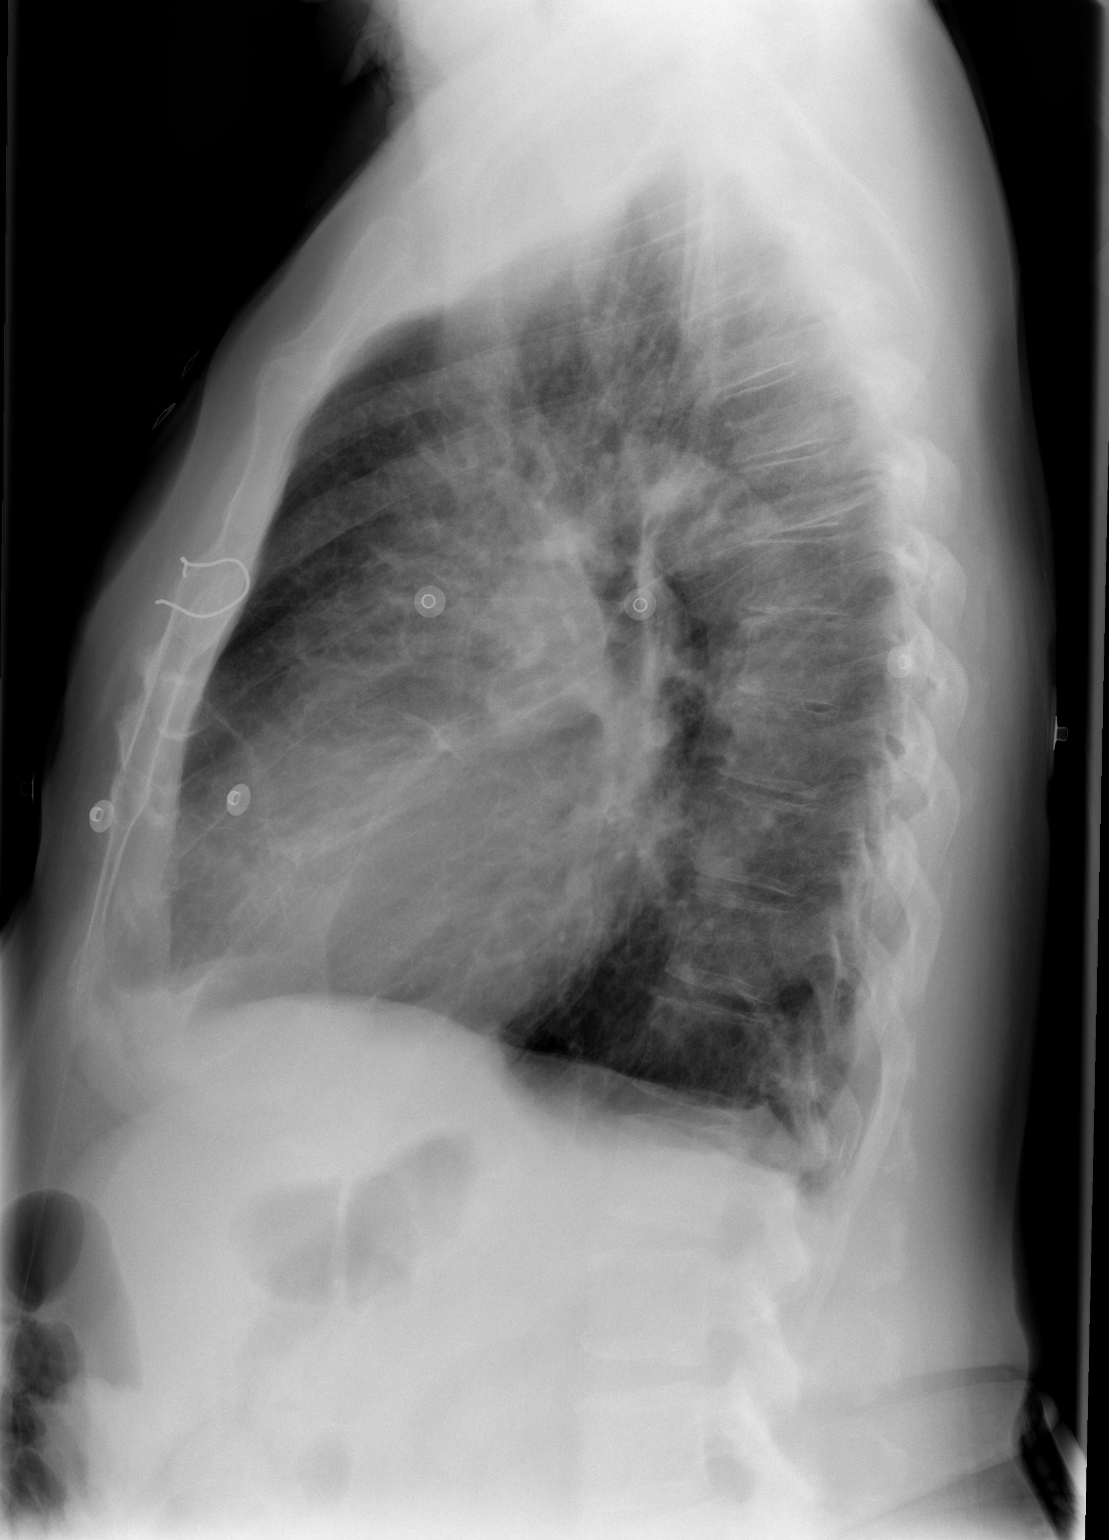

[2 of 2 positions shown; findings below may reference images not displayed]

FINDINGS: Heart size is upper normal. Negative for heart failure. Negative for
infiltrate effusion or mass. Negative for pneumonia.
IMPRESSION: No active cardiopulmonary disease.

## 2015-02-17 ENCOUNTER — Emergency Department (HOSPITAL_COMMUNITY)
Admission: EM | Admit: 2015-02-17 | Discharge: 2015-02-17 | Disposition: A | Discharge status: Home / Self Care | Payer: Medicare HMO | Attending: Emergency Medicine | Admitting: Emergency Medicine

## 2015-02-17 ENCOUNTER — Encounter (HOSPITAL_COMMUNITY): Payer: Self-pay

## 2015-02-17 ENCOUNTER — Emergency Department (HOSPITAL_COMMUNITY): Payer: Medicare HMO

## 2015-02-17 DIAGNOSIS — R5383 Other fatigue: Secondary | ICD-10-CM | POA: Diagnosis not present

## 2015-02-17 DIAGNOSIS — I5021 Acute systolic (congestive) heart failure: Secondary | ICD-10-CM | POA: Insufficient documentation

## 2015-02-17 DIAGNOSIS — I10 Essential (primary) hypertension: Secondary | ICD-10-CM | POA: Diagnosis not present

## 2015-02-17 DIAGNOSIS — R05 Cough: Secondary | ICD-10-CM | POA: Insufficient documentation

## 2015-02-17 DIAGNOSIS — I2510 Atherosclerotic heart disease of native coronary artery without angina pectoris: Secondary | ICD-10-CM | POA: Diagnosis not present

## 2015-02-17 DIAGNOSIS — R059 Cough, unspecified: Secondary | ICD-10-CM

## 2015-02-17 DIAGNOSIS — G8929 Other chronic pain: Secondary | ICD-10-CM | POA: Insufficient documentation

## 2015-02-17 DIAGNOSIS — M1990 Unspecified osteoarthritis, unspecified site: Secondary | ICD-10-CM | POA: Diagnosis not present

## 2015-02-17 DIAGNOSIS — Z79899 Other long term (current) drug therapy: Secondary | ICD-10-CM | POA: Insufficient documentation

## 2015-02-17 DIAGNOSIS — I472 Ventricular tachycardia: Secondary | ICD-10-CM | POA: Diagnosis not present

## 2015-02-17 DIAGNOSIS — Z8639 Personal history of other endocrine, nutritional and metabolic disease: Secondary | ICD-10-CM | POA: Diagnosis not present

## 2015-02-17 LAB — CBC
HCT: 43.6 % (ref 39.0–52.0)
Hemoglobin: 15 g/dL (ref 13.0–17.0)
MCH: 29.5 pg (ref 26.0–34.0)
MCHC: 34.4 g/dL (ref 30.0–36.0)
MCV: 85.8 fL (ref 78.0–100.0)
PLATELETS: 191 10*3/uL (ref 150–400)
RBC: 5.08 MIL/uL (ref 4.22–5.81)
RDW: 13.6 % (ref 11.5–15.5)
WBC: 5.3 10*3/uL (ref 4.0–10.5)

## 2015-02-17 LAB — BASIC METABOLIC PANEL
Anion gap: 7 (ref 5–15)
BUN: 10 mg/dL (ref 6–23)
CO2: 25 mmol/L (ref 19–32)
Calcium: 9 mg/dL (ref 8.4–10.5)
Chloride: 106 mmol/L (ref 96–112)
Creatinine, Ser: 1.29 mg/dL (ref 0.50–1.35)
GFR calc non Af Amer: 52 mL/min — ABNORMAL LOW (ref >90)
GFR, EST AFRICAN AMERICAN: 60 mL/min — AB (ref >90)
Glucose, Bld: 120 mg/dL — ABNORMAL HIGH (ref 70–99)
POTASSIUM: 3.1 mmol/L — AB (ref 3.5–5.1)
SODIUM: 138 mmol/L (ref 135–145)

## 2015-02-17 LAB — RAPID URINE DRUG SCREEN, HOSP PERFORMED
Amphetamines: NOT DETECTED
Barbiturates: NOT DETECTED
Benzodiazepines: NOT DETECTED
Cocaine: NOT DETECTED
OPIATES: NOT DETECTED
Tetrahydrocannabinol: NOT DETECTED

## 2015-02-17 LAB — I-STAT TROPONIN, ED: TROPONIN I, POC: 0.01 ng/mL (ref 0.00–0.08)

## 2015-02-17 LAB — BRAIN NATRIURETIC PEPTIDE: B NATRIURETIC PEPTIDE 5: 651 pg/mL — AB (ref 0.0–100.0)

## 2015-02-17 MED ORDER — DOXYCYCLINE HYCLATE 100 MG PO CAPS: 100.0000 mg | ORAL_CAPSULE | Freq: Two times a day (BID) | ORAL | Status: DC

## 2015-02-17 NOTE — ED Notes (Signed)
Pt presents with 2 month h/o productive cough.  Pt reports phlegm is white and at times bloody.  Pt unsure of fever.  Pt reports increased shortness of breath.

## 2015-02-17 NOTE — ED Notes (Signed)
Pt back from X-ray.  

## 2015-02-17 NOTE — Discharge Instructions (Signed)
Cough, Adult  A cough is a reflex that helps clear your throat and airways. It can help heal the body or may be a reaction to an irritated airway. A cough may only last 2 or 3 weeks (acute) or may last more than 8 weeks (chronic).  CAUSES Acute cough:  Viral or bacterial infections. Chronic cough:  Infections.  Allergies.  Asthma.  Post-nasal drip.  Smoking.  Heartburn or acid reflux.  Some medicines.  Chronic lung problems (COPD).  Cancer. SYMPTOMS   Cough.  Fever.  Chest pain.  Increased breathing rate.  High-pitched whistling sound when breathing (wheezing).  Colored mucus that you cough up (sputum). TREATMENT   A bacterial cough may be treated with antibiotic medicine.  A viral cough must run its course and will not respond to antibiotics.  Your caregiver may recommend other treatments if you have a chronic cough. HOME CARE INSTRUCTIONS   Only take over-the-counter or prescription medicines for pain, discomfort, or fever as directed by your caregiver. Use cough suppressants only as directed by your caregiver.  Use a cold steam vaporizer or humidifier in your bedroom or home to help loosen secretions.  Sleep in a semi-upright position if your cough is worse at night.  Rest as needed.  Stop smoking if you smoke. SEEK IMMEDIATE MEDICAL CARE IF:   You have pus in your sputum.  Your cough starts to worsen.  You cannot control your cough with suppressants and are losing sleep.  You begin coughing up blood.  You have difficulty breathing.  You develop pain which is getting worse or is uncontrolled with medicine.  You have a fever. MAKE SURE YOU:   Understand these instructions.  Will watch your condition.  Will get help right away if you are not doing well or get worse. Document Released: 06/15/2011 Document Revised: 03/10/2012 Document Reviewed: 06/15/2011 Exit[employer] Patient Information 2015 [employer]. This information is not intended  to replace advice given to you by your health care provider. Make sure you discuss any questions you have with your health care provider.  
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
## 2015-02-17 NOTE — ED Notes (Signed)
Pt knows that urine is needed.

## 2015-02-17 NOTE — ED Notes (Signed)
Pt has urinal at bedside pt knows that urine is needed

## 2015-05-14 ENCOUNTER — Encounter (HOSPITAL_COMMUNITY): Payer: Self-pay | Admitting: Emergency Medicine

## 2015-05-14 ENCOUNTER — Emergency Department (HOSPITAL_COMMUNITY)
Admission: EM | Admit: 2015-05-14 | Discharge: 2015-05-14 | Disposition: A | Discharge status: Home / Self Care | Payer: Medicare HMO | Attending: Emergency Medicine | Admitting: Emergency Medicine

## 2015-05-14 ENCOUNTER — Emergency Department (HOSPITAL_COMMUNITY): Payer: Medicare HMO

## 2015-05-14 DIAGNOSIS — M1990 Unspecified osteoarthritis, unspecified site: Secondary | ICD-10-CM | POA: Diagnosis not present

## 2015-05-14 DIAGNOSIS — G8929 Other chronic pain: Secondary | ICD-10-CM | POA: Insufficient documentation

## 2015-05-14 DIAGNOSIS — Z8639 Personal history of other endocrine, nutritional and metabolic disease: Secondary | ICD-10-CM | POA: Insufficient documentation

## 2015-05-14 DIAGNOSIS — R140 Abdominal distension (gaseous): Secondary | ICD-10-CM | POA: Insufficient documentation

## 2015-05-14 DIAGNOSIS — Z9114 Patient's other noncompliance with medication regimen: Secondary | ICD-10-CM | POA: Diagnosis not present

## 2015-05-14 DIAGNOSIS — Z9889 Other specified postprocedural states: Secondary | ICD-10-CM | POA: Diagnosis not present

## 2015-05-14 DIAGNOSIS — I10 Essential (primary) hypertension: Secondary | ICD-10-CM | POA: Insufficient documentation

## 2015-05-14 DIAGNOSIS — I5021 Acute systolic (congestive) heart failure: Secondary | ICD-10-CM | POA: Insufficient documentation

## 2015-05-14 DIAGNOSIS — I509 Heart failure, unspecified: Secondary | ICD-10-CM

## 2015-05-14 DIAGNOSIS — N390 Urinary tract infection, site not specified: Secondary | ICD-10-CM | POA: Insufficient documentation

## 2015-05-14 DIAGNOSIS — Z79899 Other long term (current) drug therapy: Secondary | ICD-10-CM | POA: Insufficient documentation

## 2015-05-14 DIAGNOSIS — R0602 Shortness of breath: Secondary | ICD-10-CM | POA: Diagnosis present

## 2015-05-14 DIAGNOSIS — Z8674 Personal history of sudden cardiac arrest: Secondary | ICD-10-CM | POA: Insufficient documentation

## 2015-05-14 DIAGNOSIS — Z792 Long term (current) use of antibiotics: Secondary | ICD-10-CM | POA: Diagnosis not present

## 2015-05-14 DIAGNOSIS — I2510 Atherosclerotic heart disease of native coronary artery without angina pectoris: Secondary | ICD-10-CM | POA: Insufficient documentation

## 2015-05-14 LAB — URINALYSIS, ROUTINE W REFLEX MICROSCOPIC
Bilirubin Urine: NEGATIVE
Glucose, UA: NEGATIVE mg/dL
KETONES UR: NEGATIVE mg/dL
NITRITE: POSITIVE — AB
Protein, ur: 100 mg/dL — AB
SPECIFIC GRAVITY, URINE: 1.011 (ref 1.005–1.030)
Urobilinogen, UA: 1 mg/dL (ref 0.0–1.0)
pH: 6 (ref 5.0–8.0)

## 2015-05-14 LAB — CBC WITH DIFFERENTIAL/PLATELET
BASOS ABS: 0 10*3/uL (ref 0.0–0.1)
BASOS PCT: 0 % (ref 0–1)
EOS ABS: 0.1 10*3/uL (ref 0.0–0.7)
EOS PCT: 1 % (ref 0–5)
HCT: 40.6 % (ref 39.0–52.0)
HEMOGLOBIN: 13.5 g/dL (ref 13.0–17.0)
Lymphocytes Relative: 35 % (ref 12–46)
Lymphs Abs: 1.8 10*3/uL (ref 0.7–4.0)
MCH: 28.5 pg (ref 26.0–34.0)
MCHC: 33.3 g/dL (ref 30.0–36.0)
MCV: 85.8 fL (ref 78.0–100.0)
Monocytes Absolute: 0.4 10*3/uL (ref 0.1–1.0)
Monocytes Relative: 9 % (ref 3–12)
NEUTROS ABS: 2.8 10*3/uL (ref 1.7–7.7)
Neutrophils Relative %: 55 % (ref 43–77)
Platelets: 210 10*3/uL (ref 150–400)
RBC: 4.73 MIL/uL (ref 4.22–5.81)
RDW: 13.9 % (ref 11.5–15.5)
WBC: 5.1 10*3/uL (ref 4.0–10.5)

## 2015-05-14 LAB — I-STAT TROPONIN, ED: Troponin i, poc: 0.04 ng/mL (ref 0.00–0.08)

## 2015-05-14 LAB — COMPREHENSIVE METABOLIC PANEL
ALK PHOS: 77 U/L (ref 38–126)
ALT: 16 U/L — ABNORMAL LOW (ref 17–63)
AST: 31 U/L (ref 15–41)
Albumin: 2.8 g/dL — ABNORMAL LOW (ref 3.5–5.0)
Anion gap: 12 (ref 5–15)
BUN: 10 mg/dL (ref 6–20)
CO2: 24 mmol/L (ref 22–32)
Calcium: 8.5 mg/dL — ABNORMAL LOW (ref 8.9–10.3)
Chloride: 101 mmol/L (ref 101–111)
Creatinine, Ser: 1.49 mg/dL — ABNORMAL HIGH (ref 0.61–1.24)
GFR calc non Af Amer: 44 mL/min — ABNORMAL LOW (ref >60)
GFR, EST AFRICAN AMERICAN: 51 mL/min — AB (ref >60)
Glucose, Bld: 116 mg/dL — ABNORMAL HIGH (ref 65–99)
Potassium: 3.6 mmol/L (ref 3.5–5.1)
Sodium: 137 mmol/L (ref 135–145)
Total Bilirubin: 0.8 mg/dL (ref 0.3–1.2)
Total Protein: 7.3 g/dL (ref 6.5–8.1)

## 2015-05-14 LAB — URINE MICROSCOPIC-ADD ON

## 2015-05-14 LAB — PROTIME-INR
INR: 1.11 (ref 0.00–1.49)
Prothrombin Time: 14.4 seconds (ref 11.6–15.2)

## 2015-05-14 LAB — BRAIN NATRIURETIC PEPTIDE: B NATRIURETIC PEPTIDE 5: 2274.3 pg/mL — AB (ref 0.0–100.0)

## 2015-05-14 LAB — LIPASE, BLOOD: Lipase: 43 U/L (ref 22–51)

## 2015-05-14 MED ORDER — CEPHALEXIN 250 MG PO CAPS
500.0000 mg | ORAL_CAPSULE | Freq: Once | ORAL | Status: AC
  Administered 2015-05-14: 500 mg via ORAL
  Filled 2015-05-14: qty 2

## 2015-05-14 MED ORDER — CEPHALEXIN 500 MG PO CAPS: 500.0000 mg | ORAL_CAPSULE | Freq: Four times a day (QID) | ORAL | Status: DC

## 2015-05-14 MED ORDER — ONDANSETRON HCL 4 MG/2ML IJ SOLN
4.0000 mg | Freq: Once | INTRAMUSCULAR | Status: AC
  Administered 2015-05-14: 4 mg via INTRAVENOUS
  Filled 2015-05-14: qty 2

## 2015-05-14 MED ORDER — FUROSEMIDE 10 MG/ML IJ SOLN
60.0000 mg | Freq: Once | INTRAMUSCULAR | Status: AC
  Administered 2015-05-14: 60 mg via INTRAVENOUS
  Filled 2015-05-14: qty 6

## 2015-05-14 NOTE — ED Notes (Signed)
NAD noted. Pt given prescriptions and encouraged to take home meds. Pt discharged by wheelchair.

## 2015-05-14 NOTE — ED Notes (Signed)
Per EMS, pt comes from home with c/o shortness of breath and intermittent chest pain while walking. Pt A&OX4,NAD noted, ambulatory. Pt has been off meds reportedly for the past few weeks. Pt reports hematuria and burning with urination as well. VSS. 2 unsuccessful IV attempts made. 12 lead unremarkable.
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
## 2015-05-14 NOTE — ED Notes (Signed)
Notified phlebotomy after several of attempts for an IV/lab draws

## 2015-05-14 NOTE — Discharge Instructions (Signed)
Please restart all your medications. Recheck with your primary care doctor on Monday. Return if you're worse, especially worsening shortness of breath before then.

## 2015-05-14 NOTE — ED Notes (Signed)
X-ray at bedside

## 2015-05-19 ENCOUNTER — Encounter (HOSPITAL_COMMUNITY): Payer: Self-pay

## 2015-05-19 ENCOUNTER — Inpatient Hospital Stay (HOSPITAL_COMMUNITY): Payer: Medicare HMO

## 2015-05-19 ENCOUNTER — Emergency Department (HOSPITAL_COMMUNITY): Payer: Medicare HMO

## 2015-05-19 ENCOUNTER — Inpatient Hospital Stay (HOSPITAL_COMMUNITY)
Admission: EM | Admit: 2015-05-19 | Discharge: 2015-05-23 | DRG: 292 | Disposition: A | Discharge status: Home / Self Care | Payer: Medicare HMO | Attending: Internal Medicine | Admitting: Internal Medicine

## 2015-05-19 DIAGNOSIS — N179 Acute kidney failure, unspecified: Secondary | ICD-10-CM | POA: Diagnosis present

## 2015-05-19 DIAGNOSIS — I509 Heart failure, unspecified: Secondary | ICD-10-CM | POA: Insufficient documentation

## 2015-05-19 DIAGNOSIS — I2510 Atherosclerotic heart disease of native coronary artery without angina pectoris: Secondary | ICD-10-CM | POA: Diagnosis present

## 2015-05-19 DIAGNOSIS — I10 Essential (primary) hypertension: Secondary | ICD-10-CM | POA: Diagnosis present

## 2015-05-19 DIAGNOSIS — Z8249 Family history of ischemic heart disease and other diseases of the circulatory system: Secondary | ICD-10-CM | POA: Diagnosis not present

## 2015-05-19 DIAGNOSIS — N390 Urinary tract infection, site not specified: Secondary | ICD-10-CM | POA: Diagnosis present

## 2015-05-19 DIAGNOSIS — Z9111 Patient's noncompliance with dietary regimen: Secondary | ICD-10-CM | POA: Diagnosis present

## 2015-05-19 DIAGNOSIS — N182 Chronic kidney disease, stage 2 (mild): Secondary | ICD-10-CM | POA: Diagnosis present

## 2015-05-19 DIAGNOSIS — I429 Cardiomyopathy, unspecified: Secondary | ICD-10-CM | POA: Diagnosis not present

## 2015-05-19 DIAGNOSIS — I255 Ischemic cardiomyopathy: Secondary | ICD-10-CM | POA: Diagnosis not present

## 2015-05-19 DIAGNOSIS — I129 Hypertensive chronic kidney disease with stage 1 through stage 4 chronic kidney disease, or unspecified chronic kidney disease: Secondary | ICD-10-CM | POA: Diagnosis present

## 2015-05-19 DIAGNOSIS — I5021 Acute systolic (congestive) heart failure: Secondary | ICD-10-CM | POA: Diagnosis not present

## 2015-05-19 DIAGNOSIS — T502X5A Adverse effect of carbonic-anhydrase inhibitors, benzothiadiazides and other diuretics, initial encounter: Secondary | ICD-10-CM | POA: Diagnosis present

## 2015-05-19 DIAGNOSIS — I493 Ventricular premature depolarization: Secondary | ICD-10-CM | POA: Diagnosis present

## 2015-05-19 DIAGNOSIS — M1990 Unspecified osteoarthritis, unspecified site: Secondary | ICD-10-CM | POA: Diagnosis present

## 2015-05-19 DIAGNOSIS — R7989 Other specified abnormal findings of blood chemistry: Secondary | ICD-10-CM | POA: Diagnosis not present

## 2015-05-19 DIAGNOSIS — E8809 Other disorders of plasma-protein metabolism, not elsewhere classified: Secondary | ICD-10-CM | POA: Diagnosis present

## 2015-05-19 DIAGNOSIS — R778 Other specified abnormalities of plasma proteins: Secondary | ICD-10-CM | POA: Diagnosis present

## 2015-05-19 DIAGNOSIS — F1410 Cocaine abuse, uncomplicated: Secondary | ICD-10-CM | POA: Diagnosis present

## 2015-05-19 DIAGNOSIS — I472 Ventricular tachycardia: Secondary | ICD-10-CM | POA: Diagnosis present

## 2015-05-19 DIAGNOSIS — Z9114 Patient's other noncompliance with medication regimen: Secondary | ICD-10-CM | POA: Diagnosis present

## 2015-05-19 DIAGNOSIS — R0602 Shortness of breath: Secondary | ICD-10-CM | POA: Diagnosis not present

## 2015-05-19 DIAGNOSIS — F1020 Alcohol dependence, uncomplicated: Secondary | ICD-10-CM | POA: Diagnosis present

## 2015-05-19 DIAGNOSIS — N189 Chronic kidney disease, unspecified: Secondary | ICD-10-CM

## 2015-05-19 DIAGNOSIS — I5043 Acute on chronic combined systolic (congestive) and diastolic (congestive) heart failure: Secondary | ICD-10-CM | POA: Diagnosis present

## 2015-05-19 DIAGNOSIS — N183 Chronic kidney disease, stage 3 (moderate): Secondary | ICD-10-CM | POA: Diagnosis present

## 2015-05-19 DIAGNOSIS — E876 Hypokalemia: Secondary | ICD-10-CM | POA: Diagnosis present

## 2015-05-19 DIAGNOSIS — N3000 Acute cystitis without hematuria: Secondary | ICD-10-CM

## 2015-05-19 DIAGNOSIS — I340 Nonrheumatic mitral (valve) insufficiency: Secondary | ICD-10-CM | POA: Diagnosis present

## 2015-05-19 DIAGNOSIS — B9620 Unspecified Escherichia coli [E. coli] as the cause of diseases classified elsewhere: Secondary | ICD-10-CM | POA: Diagnosis present

## 2015-05-19 DIAGNOSIS — I4581 Long QT syndrome: Secondary | ICD-10-CM

## 2015-05-19 DIAGNOSIS — Z7982 Long term (current) use of aspirin: Secondary | ICD-10-CM | POA: Diagnosis not present

## 2015-05-19 DIAGNOSIS — E440 Moderate protein-calorie malnutrition: Secondary | ICD-10-CM | POA: Diagnosis present

## 2015-05-19 DIAGNOSIS — R9431 Abnormal electrocardiogram [ECG] [EKG]: Secondary | ICD-10-CM | POA: Diagnosis present

## 2015-05-19 DIAGNOSIS — I5189 Other ill-defined heart diseases: Secondary | ICD-10-CM | POA: Diagnosis present

## 2015-05-19 DIAGNOSIS — I25118 Atherosclerotic heart disease of native coronary artery with other forms of angina pectoris: Secondary | ICD-10-CM | POA: Diagnosis not present

## 2015-05-19 DIAGNOSIS — I428 Other cardiomyopathies: Secondary | ICD-10-CM | POA: Insufficient documentation

## 2015-05-19 LAB — CBC WITH DIFFERENTIAL/PLATELET
BASOS ABS: 0 10*3/uL (ref 0.0–0.1)
Basophils Relative: 0 % (ref 0–1)
EOS PCT: 1 % (ref 0–5)
Eosinophils Absolute: 0.1 10*3/uL (ref 0.0–0.7)
HCT: 39.4 % (ref 39.0–52.0)
HEMOGLOBIN: 13.1 g/dL (ref 13.0–17.0)
LYMPHS PCT: 32 % (ref 12–46)
Lymphs Abs: 1.6 10*3/uL (ref 0.7–4.0)
MCH: 28.4 pg (ref 26.0–34.0)
MCHC: 33.2 g/dL (ref 30.0–36.0)
MCV: 85.5 fL (ref 78.0–100.0)
Monocytes Absolute: 0.3 10*3/uL (ref 0.1–1.0)
Monocytes Relative: 7 % (ref 3–12)
Neutro Abs: 3 10*3/uL (ref 1.7–7.7)
Neutrophils Relative %: 60 % (ref 43–77)
Platelets: 182 10*3/uL (ref 150–400)
RBC: 4.61 MIL/uL (ref 4.22–5.81)
RDW: 14 % (ref 11.5–15.5)
WBC: 4.9 10*3/uL (ref 4.0–10.5)

## 2015-05-19 LAB — TSH: TSH: 2.69 u[IU]/mL (ref 0.350–4.500)

## 2015-05-19 LAB — TROPONIN I
Troponin I: 0.07 ng/mL — ABNORMAL HIGH (ref <0.031)
Troponin I: 0.07 ng/mL — ABNORMAL HIGH (ref <0.031)
Troponin I: 0.07 ng/mL — ABNORMAL HIGH (ref <0.031)

## 2015-05-19 LAB — COMPREHENSIVE METABOLIC PANEL
ALT: 18 U/L (ref 17–63)
AST: 36 U/L (ref 15–41)
Albumin: 2.8 g/dL — ABNORMAL LOW (ref 3.5–5.0)
Alkaline Phosphatase: 76 U/L (ref 38–126)
Anion gap: 12 (ref 5–15)
BUN: 16 mg/dL (ref 6–20)
CALCIUM: 8.5 mg/dL — AB (ref 8.9–10.3)
CO2: 24 mmol/L (ref 22–32)
CREATININE: 1.65 mg/dL — AB (ref 0.61–1.24)
Chloride: 104 mmol/L (ref 101–111)
GFR calc Af Amer: 45 mL/min — ABNORMAL LOW (ref >60)
GFR, EST NON AFRICAN AMERICAN: 39 mL/min — AB (ref >60)
Glucose, Bld: 121 mg/dL — ABNORMAL HIGH (ref 65–99)
Potassium: 3.1 mmol/L — ABNORMAL LOW (ref 3.5–5.1)
Sodium: 140 mmol/L (ref 135–145)
Total Bilirubin: 0.9 mg/dL (ref 0.3–1.2)
Total Protein: 6.8 g/dL (ref 6.5–8.1)

## 2015-05-19 LAB — URINALYSIS, ROUTINE W REFLEX MICROSCOPIC
BILIRUBIN URINE: NEGATIVE
Glucose, UA: NEGATIVE mg/dL
Ketones, ur: NEGATIVE mg/dL
NITRITE: NEGATIVE
PH: 6.5 (ref 5.0–8.0)
Protein, ur: 100 mg/dL — AB
SPECIFIC GRAVITY, URINE: 1.011 (ref 1.005–1.030)
UROBILINOGEN UA: 1 mg/dL (ref 0.0–1.0)

## 2015-05-19 LAB — RAPID URINE DRUG SCREEN, HOSP PERFORMED
Amphetamines: NOT DETECTED
BENZODIAZEPINES: NOT DETECTED
Barbiturates: NOT DETECTED
Cocaine: NOT DETECTED
Opiates: NOT DETECTED
Tetrahydrocannabinol: NOT DETECTED

## 2015-05-19 LAB — URINE MICROSCOPIC-ADD ON

## 2015-05-19 LAB — BRAIN NATRIURETIC PEPTIDE: B NATRIURETIC PEPTIDE 5: 3723.9 pg/mL — AB (ref 0.0–100.0)

## 2015-05-19 LAB — MAGNESIUM: Magnesium: 1.5 mg/dL — ABNORMAL LOW (ref 1.7–2.4)

## 2015-05-19 MED ORDER — THIAMINE HCL 100 MG/ML IJ SOLN
100.0000 mg | Freq: Every day | INTRAMUSCULAR | Status: DC
  Filled 2015-05-19 (×5): qty 1

## 2015-05-19 MED ORDER — MAGNESIUM SULFATE 2 GM/50ML IV SOLN
2.0000 g | Freq: Once | INTRAVENOUS | Status: AC
  Administered 2015-05-19: 2 g via INTRAVENOUS
  Filled 2015-05-19: qty 50

## 2015-05-19 MED ORDER — LORAZEPAM 2 MG/ML IJ SOLN: 1.0000 mg | Freq: Four times a day (QID) | INTRAMUSCULAR | Status: AC | PRN

## 2015-05-19 MED ORDER — SODIUM CHLORIDE 0.9 % IJ SOLN: 3.0000 mL | INTRAMUSCULAR | Status: DC | PRN

## 2015-05-19 MED ORDER — FOLIC ACID 1 MG PO TABS
1.0000 mg | ORAL_TABLET | Freq: Every day | ORAL | Status: DC
  Administered 2015-05-19 – 2015-05-23 (×5): 1 mg via ORAL
  Filled 2015-05-19 (×5): qty 1

## 2015-05-19 MED ORDER — SODIUM CHLORIDE 0.9 % IJ SOLN
3.0000 mL | Freq: Two times a day (BID) | INTRAMUSCULAR | Status: DC
  Administered 2015-05-19 – 2015-05-22 (×8): 3 mL via INTRAVENOUS

## 2015-05-19 MED ORDER — POTASSIUM CHLORIDE CRYS ER 20 MEQ PO TBCR
40.0000 meq | EXTENDED_RELEASE_TABLET | Freq: Once | ORAL | Status: AC
  Administered 2015-05-19: 40 meq via ORAL
  Filled 2015-05-19: qty 2

## 2015-05-19 MED ORDER — VITAMIN B-1 100 MG PO TABS
100.0000 mg | ORAL_TABLET | Freq: Every day | ORAL | Status: DC
  Administered 2015-05-19 – 2015-05-22 (×4): 100 mg via ORAL
  Filled 2015-05-19 (×5): qty 1

## 2015-05-19 MED ORDER — LORAZEPAM 1 MG PO TABS: 1.0000 mg | ORAL_TABLET | Freq: Four times a day (QID) | ORAL | Status: AC | PRN

## 2015-05-19 MED ORDER — POTASSIUM CHLORIDE CRYS ER 20 MEQ PO TBCR
40.0000 meq | EXTENDED_RELEASE_TABLET | Freq: Every day | ORAL | Status: DC
  Administered 2015-05-19 – 2015-05-23 (×5): 40 meq via ORAL
  Filled 2015-05-19 (×5): qty 2

## 2015-05-19 MED ORDER — ACETAMINOPHEN 325 MG PO TABS: 650.0000 mg | ORAL_TABLET | ORAL | Status: DC | PRN

## 2015-05-19 MED ORDER — HEPARIN SODIUM (PORCINE) 5000 UNIT/ML IJ SOLN
5000.0000 [IU] | Freq: Three times a day (TID) | INTRAMUSCULAR | Status: DC
  Administered 2015-05-19 – 2015-05-23 (×12): 5000 [IU] via SUBCUTANEOUS
  Filled 2015-05-19 (×12): qty 1

## 2015-05-19 MED ORDER — FUROSEMIDE 10 MG/ML IJ SOLN
60.0000 mg | Freq: Two times a day (BID) | INTRAMUSCULAR | Status: DC
  Administered 2015-05-19 – 2015-05-21 (×5): 60 mg via INTRAVENOUS
  Filled 2015-05-19 (×5): qty 6

## 2015-05-19 MED ORDER — CEFTRIAXONE SODIUM IN DEXTROSE 20 MG/ML IV SOLN
1.0000 g | INTRAVENOUS | Status: DC
  Administered 2015-05-19 – 2015-05-22 (×4): 1 g via INTRAVENOUS
  Filled 2015-05-19 (×5): qty 50

## 2015-05-19 MED ORDER — SODIUM CHLORIDE 0.9 % IV SOLN: 250.0000 mL | INTRAVENOUS | Status: DC | PRN

## 2015-05-19 MED ORDER — ASPIRIN EC 81 MG PO TBEC
81.0000 mg | DELAYED_RELEASE_TABLET | Freq: Every day | ORAL | Status: DC
  Administered 2015-05-19 – 2015-05-23 (×5): 81 mg via ORAL
  Filled 2015-05-19 (×5): qty 1

## 2015-05-19 MED ORDER — ISOSORBIDE MONONITRATE 15 MG HALF TABLET
15.0000 mg | ORAL_TABLET | Freq: Every day | ORAL | Status: DC
  Administered 2015-05-19 – 2015-05-23 (×5): 15 mg via ORAL
  Filled 2015-05-19 (×5): qty 1

## 2015-05-19 MED ORDER — ADULT MULTIVITAMIN W/MINERALS CH
1.0000 | ORAL_TABLET | Freq: Every day | ORAL | Status: DC
  Administered 2015-05-19 – 2015-05-23 (×5): 1 via ORAL
  Filled 2015-05-19 (×5): qty 1

## 2015-05-19 MED ORDER — FUROSEMIDE 10 MG/ML IJ SOLN
40.0000 mg | Freq: Once | INTRAMUSCULAR | Status: AC
  Administered 2015-05-19: 40 mg via INTRAVENOUS
  Filled 2015-05-19: qty 4

## 2015-05-19 NOTE — ED Notes (Signed)
Attempted report 
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
## 2015-05-19 NOTE — ED Notes (Signed)
Admitting MD at bedside.

## 2015-05-19 NOTE — Progress Notes (Signed)
  Echocardiogram 2D Echocardiogram has been performed.  Billy Parker 05/19/2015, 1:49 PM

## 2015-05-19 NOTE — ED Notes (Signed)
[facility]- pt coming from home with exertional shortness of breath and dizziness since Saturday. Denies dizziness at present. Pt treated here Saturday for same and dx with UTI. Pt 94% on room air, reports O2 makes him feel better, O2 97% on 3L with EMS.

## 2015-05-19 NOTE — Consult Note (Signed)
Primary Physician: Primary Cardiologist:  harding  Asked to see re CHF  HPI:  Patient is a 79 yo who is followed by [name]  Hx of NICM  Cath in 2014 showed no signif CAD  Admitted for CHF exacerbations in Jan 2015 and April 2015.  Hx of cocaine use prior to each admit.   Patient with history of noncompliance Not felt to be a candidate for ICD   Last seen in clinic in May 2015.  Had stopped meds prior to that visit.    CAMe to ER on 5/14 with several days of SOB  Alos hematuria   Had N/V  Not able to keep meds  Had not been on fluid pill prior   down   BNP 2200  UA with evid of UTI  Given IV lasix x 1  Started on po lasix.   Since then  He was unable to get to drug store  Did not get meds filled   Has been progressively SOB  Still with dysuria with hematuria. NO CP   Cant sleep due to SOB   Still with N and V.    Patinet came to ER today complaining of SOB and abdominal distention.    At times will drink 2 40 oz of beer per  Day  When has company  To tobacco  No Cocaine       Past Medical History  Diagnosis Date  . Hypertension   . Chest pain, exertional     "just today" (07/06/2013)  . Exertional shortness of breath     "just in the last 2 weeks" (07/06/2013)  . Chronic lower back pain   . Arthritis     "bad in my back & in my right forearm" (07/06/2013)  . Cardiomyopathy, ischemic, EF 25-30% 07/08/2013  . Acute systolic HF (heart failure) 07/08/2013  . Abnormal nuclear stress test, 07/07/13 large scar no ischemia 07/08/2013  . At risk for sudden cardiac death 07-18-2013  . CAD (coronary artery disease), non obstructive Jul 18, 2013  . NSVT (nonsustained ventricular tachycardia) 07/09/2013  . Acute exacerbation of CHF (congestive heart failure) 04/26/2014  . Hypokalemia 07/07/2013    Medications Prior to Admission  Medication Sig Dispense Refill  . carvedilol (COREG) 12.5 MG tablet Take 12.5 mg by mouth 2 (two) times daily with a meal.    . furosemide (LASIX) 40 MG tablet Take 40 mg  by mouth 2 (two) times daily.    Marland Kitchen albuterol (PROVENTIL HFA;VENTOLIN HFA) 108 (90 BASE) MCG/ACT inhaler Inhale 1-2 puffs into the lungs every 6 (six) hours as needed for wheezing or shortness of breath.    . hydrochlorothiazide (HYDRODIURIL) 12.5 MG tablet Take 12.5 mg by mouth daily.          Infusions:   Allergies  Allergen Reactions  . Other Swelling    antibiotic    History   Social History  . Marital Status: Divorced    Spouse Name: N/A  . Number of Children: N/A  . Years of Education: N/A   Occupational History  . Not on file.   Social History Main Topics  . Smoking status: Never Smoker   . Smokeless tobacco: Never Used  . Alcohol Use: 1.2 oz/week    2 Cans of beer per week     Comment: occasional  . Drug Use: Yes    Special: "Crack" cocaine, Marijuana     Comment: last time 12/24/13  marijuna, crack  . Sexual Activity: Not Currently  Other Topics Concern  . Not on file   Social History Narrative    Family History  Problem Relation Age of Onset  . Cancer - Other Mother   . Heart disease Sister     CABG    REVIEW OF SYSTEMS:  All systems reviewed  Negative to the above problem except as noted above.    PHYSICAL EXAM: Filed Vitals:   05/19/15 1100  BP: 125/94  Pulse: 76  Temp:   Resp: 32     Intake/Output Summary (Last 24 hours) at 05/19/15 1137 Last data filed at 05/19/15 1128  Gross per 24 hour  Intake      0 ml  Output    225 ml  Net   -225 ml    General:  Well appearing. No respiratory difficulty HEENT: normal Neck: supple. JVP is mildly increase  No bruits . Cor: PMI nondisplaced. Regular rate & rhythm. No rubs, gallops or murmurs. Lungs: clear Abdomen: soft, nontender, nondistended. No hepatosplenomegaly. No bruits or masses. Good bowel sounds. Extremities: no cyanosis, clubbing, rash,  Tr edema   Neuro: alert & oriented x 3, cranial nerves grossly intact. moves all 4 extremities w/o difficulty. Affect pleasant.  ECG:  NSR   71 bpm  Occasional PVC  LVH  Prolonged QT    Results for orders placed or performed during the hospital encounter of 05/19/15 (from the past 24 hour(s))  Comprehensive metabolic panel     Status: Abnormal   Collection Time: 05/19/15  8:09 AM  Result Value Ref Range   Sodium 140 135 - 145 mmol/L   Potassium 3.1 (L) 3.5 - 5.1 mmol/L   Chloride 104 101 - 111 mmol/L   CO2 24 22 - 32 mmol/L   Glucose, Bld 121 (H) 65 - 99 mg/dL   BUN 16 6 - 20 mg/dL   Creatinine, Ser 1.65 (H) 0.61 - 1.24 mg/dL   Calcium 8.5 (L) 8.9 - 10.3 mg/dL   Total Protein 6.8 6.5 - 8.1 g/dL   Albumin 2.8 (L) 3.5 - 5.0 g/dL   AST 36 15 - 41 U/L   ALT 18 17 - 63 U/L   Alkaline Phosphatase 76 38 - 126 U/L   Total Bilirubin 0.9 0.3 - 1.2 mg/dL   GFR calc non Af Amer 39 (L) >60 mL/min   GFR calc Af Amer 45 (L) >60 mL/min   Anion gap 12 5 - 15  Brain natriuretic peptide     Status: Abnormal   Collection Time: 05/19/15  8:09 AM  Result Value Ref Range   B Natriuretic Peptide 3723.9 (H) 0.0 - 100.0 pg/mL  Troponin I     Status: Abnormal   Collection Time: 05/19/15  8:09 AM  Result Value Ref Range   Troponin I 0.07 (H) <0.031 ng/mL  CBC with Differential     Status: None   Collection Time: 05/19/15  8:09 AM  Result Value Ref Range   WBC 4.9 4.0 - 10.5 K/uL   RBC 4.61 4.22 - 5.81 MIL/uL   Hemoglobin 13.1 13.0 - 17.0 g/dL   HCT 39.4 39.0 - 52.0 %   MCV 85.5 78.0 - 100.0 fL   MCH 28.4 26.0 - 34.0 pg   MCHC 33.2 30.0 - 36.0 g/dL   RDW 14.0 11.5 - 15.5 %   Platelets 182 150 - 400 K/uL   Neutrophils Relative % 60 43 - 77 %   Neutro Abs 3.0 1.7 - 7.7 K/uL   Lymphocytes Relative 32 12 -  46 %   Lymphs Abs 1.6 0.7 - 4.0 K/uL   Monocytes Relative 7 3 - 12 %   Monocytes Absolute 0.3 0.1 - 1.0 K/uL   Eosinophils Relative 1 0 - 5 %   Eosinophils Absolute 0.1 0.0 - 0.7 K/uL   Basophils Relative 0 0 - 1 %   Basophils Absolute 0.0 0.0 - 0.1 K/uL  Urinalysis, Routine w reflex microscopic     Status: Abnormal   Collection  Time: 05/19/15  8:47 AM  Result Value Ref Range   Color, Urine AMBER (A) YELLOW   APPearance CLOUDY (A) CLEAR   Specific Gravity, Urine 1.011 1.005 - 1.030   pH 6.5 5.0 - 8.0   Glucose, UA NEGATIVE NEGATIVE mg/dL   Hgb urine dipstick TRACE (A) NEGATIVE   Bilirubin Urine NEGATIVE NEGATIVE   Ketones, ur NEGATIVE NEGATIVE mg/dL   Protein, ur 100 (A) NEGATIVE mg/dL   Urobilinogen, UA 1.0 0.0 - 1.0 mg/dL   Nitrite NEGATIVE NEGATIVE   Leukocytes, UA TRACE (A) NEGATIVE  Urine microscopic-add on     Status: Abnormal   Collection Time: 05/19/15  8:47 AM  Result Value Ref Range   Squamous Epithelial / LPF RARE RARE   WBC, UA 11-20 <3 WBC/hpf   RBC / HPF 3-6 <3 RBC/hpf   Bacteria, UA FEW (A) RARE   Casts HYALINE CASTS (A) NEGATIVE   Dg Chest 2 View  05/19/2015   CLINICAL DATA:  Shortness of breath, dizziness starting Saturday  EXAM: CHEST  2 VIEW  COMPARISON:  05/14/2015  FINDINGS: Cardiomediastinal silhouette is stable. No acute infiltrate or pleural effusion. No pulmonary edema. Stable chronic mild interstitial prominence.  IMPRESSION: No active disease.  Stable chronic mild and interstitial prominence.   Electronically Signed   By: Lahoma Crocker M.D.   On: 05/19/2015 08:36     ASSESSMENT:   Billy Parker is a 79 yo with a history of NICM and noncompliance  Presented to ER last wk with SOB  Never had lasix Rx filled  Now SOB is worse.  Still with hematuria and dysuria.   He has already received lasix x 1  Still wil some volume increase on exam Denies cocaine use  Still drinking EtOH  Recomm:  Would continue diuresis.  Strict I/O  Follow Cr  FOllow BP   Check urine for cocaine  Add ACEI depending on cr COunselled on alcohol cessation. WIll continue to follow   2.  HTN  Follow with diuresis.
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
## 2015-05-20 ENCOUNTER — Ambulatory Visit (HOSPITAL_BASED_OUTPATIENT_CLINIC_OR_DEPARTMENT_OTHER): Payer: Medicare HMO

## 2015-05-20 DIAGNOSIS — I5043 Acute on chronic combined systolic (congestive) and diastolic (congestive) heart failure: Principal | ICD-10-CM

## 2015-05-20 DIAGNOSIS — I493 Ventricular premature depolarization: Secondary | ICD-10-CM

## 2015-05-20 DIAGNOSIS — E876 Hypokalemia: Secondary | ICD-10-CM

## 2015-05-20 DIAGNOSIS — F1410 Cocaine abuse, uncomplicated: Secondary | ICD-10-CM

## 2015-05-20 DIAGNOSIS — I429 Cardiomyopathy, unspecified: Secondary | ICD-10-CM

## 2015-05-20 DIAGNOSIS — F1020 Alcohol dependence, uncomplicated: Secondary | ICD-10-CM

## 2015-05-20 DIAGNOSIS — I5189 Other ill-defined heart diseases: Secondary | ICD-10-CM | POA: Diagnosis present

## 2015-05-20 DIAGNOSIS — E8809 Other disorders of plasma-protein metabolism, not elsewhere classified: Secondary | ICD-10-CM

## 2015-05-20 DIAGNOSIS — E440 Moderate protein-calorie malnutrition: Secondary | ICD-10-CM

## 2015-05-20 DIAGNOSIS — N390 Urinary tract infection, site not specified: Secondary | ICD-10-CM

## 2015-05-20 DIAGNOSIS — I25118 Atherosclerotic heart disease of native coronary artery with other forms of angina pectoris: Secondary | ICD-10-CM

## 2015-05-20 DIAGNOSIS — I519 Heart disease, unspecified: Secondary | ICD-10-CM

## 2015-05-20 DIAGNOSIS — I428 Other cardiomyopathies: Secondary | ICD-10-CM | POA: Insufficient documentation

## 2015-05-20 DIAGNOSIS — I255 Ischemic cardiomyopathy: Secondary | ICD-10-CM

## 2015-05-20 DIAGNOSIS — I10 Essential (primary) hypertension: Secondary | ICD-10-CM

## 2015-05-20 DIAGNOSIS — N183 Chronic kidney disease, stage 3 (moderate): Secondary | ICD-10-CM

## 2015-05-20 LAB — BASIC METABOLIC PANEL
ANION GAP: 10 (ref 5–15)
BUN: 21 mg/dL — AB (ref 6–20)
CALCIUM: 8.6 mg/dL — AB (ref 8.9–10.3)
CO2: 26 mmol/L (ref 22–32)
CREATININE: 1.7 mg/dL — AB (ref 0.61–1.24)
Chloride: 102 mmol/L (ref 101–111)
GFR calc non Af Amer: 37 mL/min — ABNORMAL LOW (ref >60)
GFR, EST AFRICAN AMERICAN: 43 mL/min — AB (ref >60)
GLUCOSE: 107 mg/dL — AB (ref 65–99)
POTASSIUM: 3.4 mmol/L — AB (ref 3.5–5.1)
Sodium: 138 mmol/L (ref 135–145)

## 2015-05-20 MED ORDER — CARVEDILOL 6.25 MG PO TABS
6.2500 mg | ORAL_TABLET | Freq: Two times a day (BID) | ORAL | Status: DC
  Administered 2015-05-20: 6.25 mg via ORAL
  Filled 2015-05-20 (×4): qty 1

## 2015-05-20 MED ORDER — PERFLUTREN LIPID MICROSPHERE
1.0000 mL | INTRAVENOUS | Status: AC | PRN
  Administered 2015-05-20: 10 mL via INTRAVENOUS
  Filled 2015-05-20: qty 10

## 2015-05-20 NOTE — Progress Notes (Addendum)
[facility]                                                                              Patient Demographics  Billy Parker, is a 79 y.o. male, DOB - 25-Sep-1936, IOX:[id number]  Admit date - 05/19/2015   Admitting Physician Janece Canterbury, MD  Outpatient Primary MD for the patient is Philis Fendt, MD  LOS - 1   Chief Complaint  Patient presents with  . Shortness of Breath      HPI on 05/19/2015 by [name], [place] with Dr. Janece Canterbury Billy Parker is a 79 y.o. male, with a PMH of CHF (EF of 30- 35% in 2015), poly substance abuse, adn chronic lower back pain, who presents from home alone with SOB and dizziness. [name] related that approximately one month ago he started having burning with urination. This has continued and one day recently he had a great deal of blood in his urine. Approximately 2 weeks ago he began to develop SOB. He noticed he could no longer walk up the street or shop for groceries. As his SOB worsened he could no longer lie flat. He endorses PND. He developed a cough that was so severe it caused him to vomit. He began vomiting so frequently that he was unable to take his home medications (including lasix). He came to the ER on 5/14 and was diagnosed with a UTI and discharged on keflex. Unfortunately he was too SOB to have the antibiotic filled. He returned home and his breathing has progressively worsened. [name] states he last used crack cocaine in December 2015, but that he still normally drinks (2) 40 ounce beers per day.  In the ER, [name] has an elevated BNP of 3723 (baseline 651), creatinine is elevated at 1.65 from 1.29, Troponin is mildly elevated at 0.07, albumin and potassium are both low. CXR is positive for vascular congestion.  Assessment & Plan   Acute on chronic combined systolic/diastolic heart failure exacerbation -Suspect due to dietary noncompliance -Echocardiogram showed grade 3 diastolic dysfunction,  diffuse hypokinesis, patients had EF of 30-35% in past -BNP 3723 -Continue to monitor intake and output, daily weight -Continue diuresis with Lasix, currently 60 mg IV twice a day -Continue Coreg  -Nutrition consulted  -Cardiology consulted and appreciated   Elevated troponin -Likely due to acute renal failure and heart failure exacerbation. -Troponin 0.07 consistently  -Patient currently denies any chest pain  -TSH 2.69  Acute on chronic renal failure (CKD Stage 3) in the setting of a UTI. Patient reports having pain with urination for the past month Will treat UTI with Rocephin. Avoid fluoroquinolones due to prolonged QT. Culture pending.  Alcohol dependence/polysubstance abuse  -Toxicology negative  -Patient counseled on smoking, polysubstance abuse and alcohol cessation  -Continue CIWA protocol  Hypertension -Currently stable, continue Lasix and coronary   Hypoalbuminemia/malnutrition -Likely protein calorie malnutrition secondary to poor by mouth intake and drinking alcohol regularly. -Nutrition consulted   Hypokalemia -K 3.4, likely secondary to diuresis -Magnesium 1.5 -Will replace magnesium and potassium and continue to monitor BMP  UTI -UA: WBC 11-20, few bacteria, trace leukocytes -Continue ceftriaxone -Urine culture: Greater than 75,000 colonies of Escherichia coli,  sensitivities pending  Code Status: Full  Family Communication: None at bedside  Disposition Plan: Admitted, pending further recommendations from cardiology  Time Spent in minutes   30 minutes  Procedures  Echocardiogram  Consults   Cardiology  DVT Prophylaxis  Heparin  Lab Results  Component Value Date   PLT 182 05/19/2015    Medications  Scheduled Meds: . aspirin EC  81 mg Oral Daily  . carvedilol  6.25 mg Oral BID WC  . cefTRIAXone (ROCEPHIN)  IV  1 g Intravenous Q24H  . folic acid  1 mg Oral Daily  . furosemide  60 mg Intravenous Q12H  . heparin  5,000 Units Subcutaneous  3 times per day  . isosorbide mononitrate  15 mg Oral Daily  . multivitamin with minerals  1 tablet Oral Daily  . potassium chloride  40 mEq Oral Daily  . sodium chloride  3 mL Intravenous Q12H  . thiamine  100 mg Oral Daily   Or  . thiamine  100 mg Intravenous Daily   Continuous Infusions:  PRN Meds:.sodium chloride, acetaminophen, LORazepam **OR** LORazepam, perflutren lipid microspheres (DEFINITY) IV suspension, sodium chloride  Antibiotics    Anti-infectives    Start     Dose/Rate Route Frequency Ordered Stop   05/19/15 1400  cefTRIAXone (ROCEPHIN) 1 g in dextrose 5 % 50 mL IVPB - Premix     1 g 100 mL/hr over 30 Minutes Intravenous Every 24 hours 05/19/15 1224        Subjective:   [name] seen and examined today.  Patient feels her shortness of breath has improved. Denies any chest pain. Patient does admit to eating fried food and drinking lots of fluids. Denies any current alcohol or tobacco use or cocaine use. Patient denies any abdominal pain, nausea, vomiting, dizziness and headache.  Objective:   Filed Vitals:   05/19/15 1409 05/19/15 2024 05/20/15 0523 05/20/15 0955  BP: 133/88 94/76 107/80 121/70  Pulse: 82 65 76 81  Temp: 97.5 F (36.4 C) 97.4 F (36.3 C) 97 F (36.1 C)   TempSrc: Oral Oral Oral   Resp: 22 18 18    Height:      Weight:   68.402 kg (150 lb 12.8 oz)   SpO2: 98% 98% 98%     Wt Readings from Last 3 Encounters:  05/20/15 68.402 kg (150 lb 12.8 oz)  02/17/15 68.04 kg (150 lb)  10/14/14 68.04 kg (150 lb)     Intake/Output Summary (Last 24 hours) at 05/20/15 1324 Last data filed at 05/20/15 0956  Gross per 24 hour  Intake   1540 ml  Output   2600 ml  Net  -1060 ml    Exam  General: Well developed, well nourished, NAD, appears stated age  HEENT: NCAT, mucous membranes moist.   Cardiovascular: S1 S2 auscultated, no rubs, murmurs or gallops. Regular rate and rhythm.  Respiratory: Basilar crackles, right base otherwise  clear  Abdomen: Soft, nontender, nondistended, + bowel sounds  Extremities: warm dry without cyanosis clubbing or edema  Neuro: AAOx3, nonfocal  Data Review   Micro Results Recent Results (from the past 240 hour(s))  Urine culture     Status: None (Preliminary result)   Collection Time: 05/19/15  8:47 AM  Result Value Ref Range Status   Specimen Description URINE, CLEAN CATCH  Final   Special Requests NONE  Final   Colony Count   Final    75,000 COLONIES/ML Performed at [employer]  Final    ESCHERICHIA COLI Performed at [facility]    Report Status PENDING  Incomplete    Radiology Reports Dg Chest 2 View  05/19/2015   CLINICAL DATA:  Shortness of breath, dizziness starting Saturday  EXAM: CHEST  2 VIEW  COMPARISON:  05/14/2015  FINDINGS: Cardiomediastinal silhouette is stable. No acute infiltrate or pleural effusion. No pulmonary edema. Stable chronic mild interstitial prominence.  IMPRESSION: No active disease.  Stable chronic mild and interstitial prominence.   Electronically Signed   By: Lahoma Crocker M.D.   On: 05/19/2015 08:36   Dg Chest Port 1 View  05/14/2015   CLINICAL DATA:  Cough, no congestion, vomiting x 2 weeks, no known feverHx of HTN- on meds, CHF  EXAM: PORTABLE CHEST - 1 VIEW  COMPARISON:  02/17/2015  FINDINGS: Cardiac silhouette normal in size and configuration. No mediastinal hilar masses or convincing adenopathy.  There are prominent interstitial markings, stable from the prior study. No lung consolidation or edema. No pleural effusion or pneumothorax.  Bony thorax is grossly intact.  IMPRESSION: No acute cardiopulmonary disease.   Electronically Signed   By: Lajean Manes M.D.   On: 05/14/2015 11:36    CBC  Recent Labs Lab 05/14/15 1119 05/19/15 0809  WBC 5.1 4.9  HGB 13.5 13.1  HCT 40.6 39.4  PLT 210 182  MCV 85.8 85.5  MCH 28.5 28.4  MCHC 33.3 33.2  RDW 13.9 14.0  LYMPHSABS 1.8 1.6  MONOABS 0.4 0.3  EOSABS 0.1 0.1   BASOSABS 0.0 0.0    Chemistries   Recent Labs Lab 05/14/15 1119 05/19/15 0809 05/19/15 1057 05/20/15 0605  NA 137 140  --  138  K 3.6 3.1*  --  3.4*  CL 101 104  --  102  CO2 24 24  --  26  GLUCOSE 116* 121*  --  107*  BUN 10 16  --  21*  CREATININE 1.49* 1.65*  --  1.70*  CALCIUM 8.5* 8.5*  --  8.6*  MG  --   --  1.5*  --   AST 31 36  --   --   ALT 16* 18  --   --   ALKPHOS 77 76  --   --   BILITOT 0.8 0.9  --   --    ------------------------------------------------------------------------------------------------------------------ estimated creatinine clearance is 33.4 mL/min (by C-G formula based on Cr of 1.7). ------------------------------------------------------------------------------------------------------------------ No results for input(s): HGBA1C in the last 72 hours. ------------------------------------------------------------------------------------------------------------------ No results for input(s): CHOL, HDL, LDLCALC, TRIG, CHOLHDL, LDLDIRECT in the last 72 hours. ------------------------------------------------------------------------------------------------------------------  Recent Labs  05/19/15 1429  TSH 2.690   ------------------------------------------------------------------------------------------------------------------ No results for input(s): VITAMINB12, FOLATE, FERRITIN, TIBC, IRON, RETICCTPCT in the last 72 hours.  Coagulation profile  Recent Labs Lab 05/14/15 1119  INR 1.11    No results for input(s): DDIMER in the last 72 hours.  Cardiac Enzymes  Recent Labs Lab 05/19/15 0809 05/19/15 1429 05/19/15 2003  TROPONINI 0.07* 0.07* 0.07*   ------------------------------------------------------------------------------------------------------------------ Invalid input(s): POCBNP    Camaryn Lumbert D.O. on 05/20/2015 at 1:24 PM  Between 7am to 7pm - Pager - [phone]  After 7pm go to [email or web address] - password  [email or web address]  And look for the night coverage person covering for me after hours  [facility] Office  [phone]

## 2015-05-20 NOTE — Progress Notes (Signed)
  Echocardiogram 2D Echocardiogram has been performed.  Billy Parker M 05/20/2015, 12:50 PM

## 2015-05-20 NOTE — Care Management Note (Signed)
Case Management Note  Patient Details  Name: Billy Parker MRN: [id number] Date of Birth: 08/30/1936  Subjective/Objective:   Admitted with Acute CHF                 Action/Plan: CM talked to patient about DCP; patient lives alone, stated that he has a sister in [place] that will help him if needed; Patient was [facility] the [facility] but stated that they did not help him and he stopped going. Later he went to another MD for primary care in November but never followed up. Pt story is very hard to follow. CM asked patient about why he did not get his prescriptions filled, patient stated that he had some medication at home and that he was vomiting and was unable to take his medication. CM talked to pt at length about lifestyle changes and being responsible for his health. Patient became tearful and stated that he was going to try to do better because he was at a point in life where he just didn't care about anything but now he wants to live.CM will continue to follow for DCP.  Expected Discharge Date:       05/23/2015           Expected Discharge Plan:   home- possibly [facility] for teaching  Status of Service:   in progress  Additional Comments:  Sherrilyn Rist [phone] 05/20/2015, 1:42 PM

## 2015-05-20 NOTE — Progress Notes (Signed)
Subjective:  SOB improving  Objective:  Vital Signs in the last 24 hours: Temp:  [97 F (36.1 C)-97.5 F (36.4 C)] 97 F (36.1 C) (05/20 0523) Pulse Rate:  [65-82] 81 (05/20 0955) Resp:  [18-22] 18 (05/20 0523) BP: (94-133)/(70-88) 121/70 mmHg (05/20 0955) SpO2:  [98 %] 98 % (05/20 0523) Weight:  [150 lb 12.8 oz (68.402 kg)] 150 lb 12.8 oz (68.402 kg) (05/20 0523)  Intake/Output from previous day:  Intake/Output Summary (Last 24 hours) at 05/20/15 1303 Last data filed at 05/20/15 0956  Gross per 24 hour  Intake   1540 ml  Output   2600 ml  Net  -1060 ml    Physical Exam: General appearance: alert, cooperative, no distress and mildly obese Neck: no JVD Lungs: few crackles Rt base Heart: regular rate and rhythm Extremities: no edema Skin: Skin color, texture, turgor normal. No rashes or lesions Neurologic: Grossly normal   Rate: 78  Rhythm: normal sinus rhythm and premature ventricular contractions (PVC)  Lab Results:  Recent Labs  05/19/15 0809  WBC 4.9  HGB 13.1  PLT 182    Recent Labs  05/19/15 0809 05/20/15 0605  NA 140 138  K 3.1* 3.4*  CL 104 102  CO2 24 26  GLUCOSE 121* 107*  BUN 16 21*  CREATININE 1.65* 1.70*    Recent Labs  05/19/15 1429 05/19/15 2003  TROPONINI 0.07* 0.07*   No results for input(s): INR in the last 72 hours.  Scheduled Meds: . aspirin EC  81 mg Oral Daily  . carvedilol  6.25 mg Oral BID WC  . cefTRIAXone (ROCEPHIN)  IV  1 g Intravenous Q24H  . folic acid  1 mg Oral Daily  . furosemide  60 mg Intravenous Q12H  . heparin  5,000 Units Subcutaneous 3 times per day  . isosorbide mononitrate  15 mg Oral Daily  . multivitamin with minerals  1 tablet Oral Daily  . potassium chloride  40 mEq Oral Daily  . sodium chloride  3 mL Intravenous Q12H  . thiamine  100 mg Oral Daily   Or  . thiamine  100 mg Intravenous Daily   Continuous Infusions:  PRN Meds:.sodium chloride, acetaminophen, LORazepam **OR** LORazepam,  perflutren lipid microspheres (DEFINITY) IV suspension, sodium chloride   Imaging: Imaging results have been reviewed  Cardiac Studies: Echo 05/19/15 Study Conclusions  - Left ventricle: The cavity size was normal. There was moderate concentric hypertrophy. Systolic function was normal. Severe diffuse hypokinesis. Doppler parameters are consistent with a reversible restrictive pattern, indicative of decreased left ventricular diastolic compliance and/or increased left atrial pressure (grade 3 diastolic dysfunction). - Mitral valve: There was moderate regurgitation directed eccentrically. - Left atrium: The atrium was moderately dilated. - Right ventricle: The cavity size was dilated. Wall thickness was normal. Systolic function was mildly reduced. - Tricuspid valve: There was mild-moderate regurgitation. - Pulmonary arteries: Systolic pressure was mildly increased. PA peak pressure: 39 mm Hg (S).   Assessment/Plan:  79 yo followed by [name] Hx of NICM with previous EF of 30-35%. Cath in 2014 showed no signif CAD Admitted for CHF exacerbations in Jan 2015 and April 2015. Hx of cocaine use prior to each admit.Patient with history of noncompliance Not felt to be a candidate for ICD. Last seen in clinic in May 2015. Had stopped meds prior to that visit.  Came to ER on 05/14/15 with several days of SOB, hematuria,and N/V, not able to keep medsdown as an OP. Drug screen  negative. Echo now shows NL LVF with grade 3 DD.    Principal Problem:   Acute on chronic combined systolic and diastolic CHF Active Problems:   NICM- EF 30-35% in past-now Nl LVF with LVH, grade 3 DD by echo 05/19/15   Mitral regurgitation- moderate   CKD  stage 3, GFR 30-59 ml/min   UTI (lower urinary tract infection)   Prolonged Q-T interval on ECG- (542 on 8/67/67)   Diastolic dysfunction-grade 3 on echo 05/19/15   Frequent PVCs   Essential hypertension   H/O Cocaine abuse- (negative drug  screen this admission)   CAD- non obstructive 2014   Alcohol dependence   Elevated troponin - in setting of A on C Combined CHF   Hypokalemia   Hypoalbuminemia   PLAN: Improved after diuresis though he still is on O2 and appears SOB at rest. He admits he was not taking any medication as an OP. Resume Coreg at decreased dose of 6.25 mg BID, he is also on IV Lasix 60 mg BID and low dose Imdur. K+ replacement ordered. Follow renal function. Treatment of UTI per primary service.  Echo reviewed with Dr Johnsie Cancel- pt actually has severe LVD, no mural thrombus.   Kerin Ransom [name] 05/20/2015, 1:03 PM [phone]  I saw examined the patient this morning along with [name], [place]. I agree with his findings, examination and recommendations.  The patient is listed as a cardiology patient of mine but I have not seen him since performing a cardiac catheterization in July 2014. He saw to PAs, but never came to see me. It would appear that he is coming in with an acute exacerbation of his chronic combined systolic and diastolic heart failure. I think he is been nonadherent to his medical management plan.  His current echocardiogram was read as normal function with severe diffuse hypokinesis. In fact the EF is probably 25-30% which was the case prior to this. There was concern for possible LV thrombus and therefore we are getting a limited echo with Definity contrast.  For now continue diuresis with IV Lasix - he feels somewhat better, but still is dyspneic. Still requiring oxygen. Beta blocker restarted. As blood pressure normalizes and renal function is stable we can consider adding actual reduction with ACE inhibitor/ARB versus BiDil. Currently on low-dose Imdur for preload reduction  Mild troponin elevation is likely related to endocardial ischemia from increased wall stress with CHF exacerbation. Urine tox was negative.  He will likely require several days of IV Lasix. If he shows signs of the  medical adherence and follow-up he would potentially be a candidate to consider for ICD providing no longer has any interaction with cocaine as well as alcohol.   Leonie Man, M.D., M.S. Interventional Cardiologist   Pager # [phone]

## 2015-05-20 NOTE — Progress Notes (Signed)
Initial Nutrition Assessment  DOCUMENTATION CODES:  Non-severe (moderate) malnutrition in context of chronic illness  INTERVENTION:   Encourage PO  NUTRITION DIAGNOSIS:  Malnutrition related to chronic illness as evidenced by moderate depletion of body fat, moderate depletions of muscle mass.  GOAL:  Patient will meet greater than or equal to 90% of their needs  MONITOR:  Labs, Weight trends, I & O's  REASON FOR ASSESSMENT:  Consult Assessment of nutrition requirement/status  CHF Diet Education  ASSESSMENT: Pt is a 79 y.o. male, with a PMH of CHF (EF of 30- 35% in 2015), poly substance abuse, adn chronic lower back pain, who presents from home alone with SOB and dizziness.  Received consult for estimating needs and diet education. Per pr he is "supposed to be on heart diet", but states he forgets and starts eating fried foods. He lives near [facility] where he eats a lot of his meals. Pt admits to drinking a lot of juice, soda and sweet tea, unable to say the amount, but gesturing something about 32 oz., stating "with every meal". Per pt and weight history, pt's weight has been stable and his appetite is good unless he has SOB. PO while at the hospital has been 75-100%. Provided pt with Heart Failure Nutrition Therapy by the [employer]. Walked pt through the handout, explaining things that would benefit him the most, emphasizing importance of reducing sodium intake and limiting fluids. Pt states he is willing to do what ever it takes to stay alive through Christmas and Family reunion. Discussed importance to eating healthy so that he can spend those ocations with family rather than in the hospital. Pt verbalized understanding, teach back method used, expect good compliance. Will continue to monitor. Labs reviewed: K 3.4, BUN 21, Cr 1.7, Ca 8.6, Glu 107  Height:  Ht Readings from Last 1 Encounters:  05/19/15 5\' 6"  (1.676 m)    Weight:  Wt Readings from Last  1 Encounters:  05/20/15 150 lb 12.8 oz (68.402 kg)    Ideal Body Weight:  64.5 kg  Wt Readings from Last 10 Encounters:  05/20/15 150 lb 12.8 oz (68.402 kg)  02/17/15 150 lb (68.04 kg)  10/14/14 150 lb (68.04 kg)  09/27/14 150 lb (68.04 kg)  06/30/14 153 lb 12.8 oz (69.763 kg)  06/11/14 159 lb 12.8 oz (72.485 kg)  05/05/14 157 lb 14.4 oz (71.623 kg)  04/29/14 147 lb 3.2 oz (66.769 kg)  04/20/14 150 lb (68.04 kg)  03/24/14 158 lb 6.4 oz (71.85 kg)    BMI:  Body mass index is 24.35 kg/(m^2).  Estimated Nutritional Needs:  Kcal:  1600 - 1800  Protein:  80 - 90 g  Fluid:  per MD  Skin:  Reviewed, no issues  Diet Order:   Heart Healthy  EDUCATION NEEDS:  Education needs addressed   Intake/Output Summary (Last 24 hours) at 05/20/15 1039 Last data filed at 05/20/15 0956  Gross per 24 hour  Intake   1540 ml  Output   2725 ml  Net  -1185 ml    Last BM:  5/19   Natasa Stigall A. [facility] Dietetic Intern Pager: [phone] 05/20/2015 10:49 AM

## 2015-05-21 LAB — URINE CULTURE: Colony Count: 75000

## 2015-05-21 LAB — BASIC METABOLIC PANEL
ANION GAP: 10 (ref 5–15)
BUN: 22 mg/dL — ABNORMAL HIGH (ref 6–20)
CO2: 29 mmol/L (ref 22–32)
Calcium: 8.7 mg/dL — ABNORMAL LOW (ref 8.9–10.3)
Chloride: 101 mmol/L (ref 101–111)
Creatinine, Ser: 1.66 mg/dL — ABNORMAL HIGH (ref 0.61–1.24)
GFR calc non Af Amer: 38 mL/min — ABNORMAL LOW (ref >60)
GFR, EST AFRICAN AMERICAN: 45 mL/min — AB (ref >60)
Glucose, Bld: 104 mg/dL — ABNORMAL HIGH (ref 65–99)
Potassium: 3.7 mmol/L (ref 3.5–5.1)
Sodium: 140 mmol/L (ref 135–145)

## 2015-05-21 LAB — CBC
HEMATOCRIT: 39.7 % (ref 39.0–52.0)
HEMOGLOBIN: 12.9 g/dL — AB (ref 13.0–17.0)
MCH: 28.1 pg (ref 26.0–34.0)
MCHC: 32.5 g/dL (ref 30.0–36.0)
MCV: 86.5 fL (ref 78.0–100.0)
PLATELETS: 191 10*3/uL (ref 150–400)
RBC: 4.59 MIL/uL (ref 4.22–5.81)
RDW: 14.2 % (ref 11.5–15.5)
WBC: 4.6 10*3/uL (ref 4.0–10.5)

## 2015-05-21 LAB — MAGNESIUM: Magnesium: 1.9 mg/dL (ref 1.7–2.4)

## 2015-05-21 MED ORDER — FUROSEMIDE 10 MG/ML IJ SOLN
80.0000 mg | Freq: Two times a day (BID) | INTRAMUSCULAR | Status: DC
  Administered 2015-05-21: 80 mg via INTRAVENOUS
  Filled 2015-05-21 (×3): qty 8

## 2015-05-21 MED ORDER — CARVEDILOL 6.25 MG PO TABS
6.2500 mg | ORAL_TABLET | Freq: Two times a day (BID) | ORAL | Status: DC
  Administered 2015-05-21 – 2015-05-22 (×4): 6.25 mg via ORAL
  Filled 2015-05-21 (×7): qty 1

## 2015-05-21 NOTE — Progress Notes (Signed)
[facility]                                                                              Patient Demographics  Billy Parker, is a 79 y.o. male, DOB - 08/31/1936, QPR:[id number]  Admit date - 05/19/2015   Admitting Physician Janece Canterbury, MD  Outpatient Primary MD for the patient is Philis Fendt, MD  LOS - 2   Chief Complaint  Patient presents with  . Shortness of Breath      HPI on 05/19/2015 by [name], [place] with Dr. Janece Canterbury Billy Parker is a 79 y.o. male, with a PMH of CHF (EF of 30- 35% in 2015), poly substance abuse, adn chronic lower back pain, who presents from home alone with SOB and dizziness. Billy Parker related that approximately one month ago he started having burning with urination. This has continued and one day recently he had a great deal of blood in his urine. Approximately 2 weeks ago he began to develop SOB. He noticed he could no longer walk up the street or shop for groceries. As his SOB worsened he could no longer lie flat. He endorses PND. He developed a cough that was so severe it caused him to vomit. He began vomiting so frequently that he was unable to take his home medications (including lasix). He came to the ER on 5/14 and was diagnosed with a UTI and discharged on keflex. Unfortunately he was too SOB to have the antibiotic filled. He returned home and his breathing has progressively worsened. Billy Parker states he last used crack cocaine in December 2015, but that he still normally drinks (2) 40 ounce beers per day.  In the ER, Billy Parker has an elevated BNP of 3723 (baseline 651), creatinine is elevated at 1.65 from 1.29, Troponin is mildly elevated at 0.07, albumin and potassium are both low. CXR is positive for vascular congestion.  Assessment & Plan   Acute on chronic combined systolic/diastolic heart failure exacerbation -Suspect due to dietary noncompliance -Echocardiogram showed grade 3 diastolic dysfunction,  diffuse hypokinesis, patients had EF of 30-35% in 2015; no mural apical thrombus -BNP 3723 -Continue to monitor intake and output, daily weight; UO 800 in past 24hrs, weight down by 5lbs -Continue diuresis with Lasix, currently 60 mg IV twice a day.  Possible deescalate today? -Continue Coreg, imdur -Nutrition consulted  -Cardiology consulted and appreciated; pending further recommendatinos  Elevated troponin -Likely due to acute renal failure and heart failure exacerbation. -Troponin 0.07 consistently  -Patient currently denies any chest pain  -TSH 2.69  Acute on chronic renal failure (CKD Stage 3)  -Creatinine 1.66 today -Baseline appears roughly 1.2-1.4 -Continue to monitor BMP  Alcohol dependence/polysubstance abuse  -Toxicology negative  -Patient counseled on smoking, polysubstance abuse and alcohol cessation  -Continue CIWA protocol  Hypertension -Currently stable, continue Lasix and coreg  Hypoalbuminemia/malnutrition -Likely protein calorie malnutrition secondary to poor by mouth intake and drinking alcohol regularly. -Nutrition consulted   Hypokalemia -K 3.7, likely secondary to diuresis -Magnesium 1.5 -Continue to monitor and repalce as needed  UTI -UA: WBC 11-20, few bacteria, trace leukocytes -Continue ceftriaxone -Urine culture: Greater than 75,000 colonies of Escherichia coli, sensitivities pending  Code Status: Full  Family Communication: None at bedside  Disposition Plan: Admitted, pending further recommendations from cardiology  Time Spent in minutes   30 minutes  Procedures  Echocardiogram  Consults   Cardiology  DVT Prophylaxis  Heparin  Lab Results  Component Value Date   PLT 191 05/21/2015    Medications  Scheduled Meds: . aspirin EC  81 mg Oral Daily  . carvedilol  6.25 mg Oral BID WC  . cefTRIAXone (ROCEPHIN)  IV  1 g Intravenous Q24H  . folic acid  1 mg Oral Daily  . furosemide  60 mg Intravenous Q12H  . heparin  5,000  Units Subcutaneous 3 times per day  . isosorbide mononitrate  15 mg Oral Daily  . multivitamin with minerals  1 tablet Oral Daily  . potassium chloride  40 mEq Oral Daily  . sodium chloride  3 mL Intravenous Q12H  . thiamine  100 mg Oral Daily   Or  . thiamine  100 mg Intravenous Daily   Continuous Infusions:  PRN Meds:.sodium chloride, acetaminophen, LORazepam **OR** LORazepam, sodium chloride  Antibiotics    Anti-infectives    Start     Dose/Rate Route Frequency Ordered Stop   05/19/15 1400  cefTRIAXone (ROCEPHIN) 1 g in dextrose 5 % 50 mL IVPB - Premix     1 g 100 mL/hr over 30 Minutes Intravenous Every 24 hours 05/19/15 1224        Subjective:   Billy Parker seen and examined today.  Patient feels her shortness of breath has improved and was able to bath himself this morning and walk around his room.  He denies chest pain, abdominal pain, nausea, vomiting, dizziness and headache.   Objective:   Filed Vitals:   05/20/15 1403 05/20/15 1738 05/20/15 2234 05/21/15 0644  BP: 98/70 119/86 107/67 111/88  Pulse: 68 75 63 77  Temp: 97.8 F (36.6 C)  97.6 F (36.4 C) 97.9 F (36.6 C)  TempSrc: Oral  Oral Oral  Resp: 20  18 20   Height:      Weight:    68.085 kg (150 lb 1.6 oz)  SpO2: 99%  99% 93%    Wt Readings from Last 3 Encounters:  05/21/15 68.085 kg (150 lb 1.6 oz)  02/17/15 68.04 kg (150 lb)  10/14/14 68.04 kg (150 lb)     Intake/Output Summary (Last 24 hours) at 05/21/15 1122 Last data filed at 05/21/15 0950  Gross per 24 hour  Intake   1010 ml  Output    800 ml  Net    210 ml    Exam  General: Well developed, well nourished, no distress  HEENT: NCAT, mucous membranes moist.   Cardiovascular: S1 S2 auscultated, RRR, no murmurs  Respiratory: Diminished in lower lobes, otherwise clear  Abdomen: Soft, nontender, nondistended, + bowel sounds  Extremities: warm dry without cyanosis clubbing or edema  Neuro: AAOx3, nonfocal  Data Review   Micro  Results Recent Results (from the past 240 hour(s))  Urine culture     Status: None (Preliminary result)   Collection Time: 05/19/15  8:47 AM  Result Value Ref Range Status   Specimen Description URINE, CLEAN CATCH  Final   Special Requests NONE  Final   Colony Count   Final    75,000 COLONIES/ML Performed at [facility] Performed at [facility]    Report Status PENDING  Incomplete    Radiology  Reports Dg Chest 2 View  05/19/2015   CLINICAL DATA:  Shortness of breath, dizziness starting Saturday  EXAM: CHEST  2 VIEW  COMPARISON:  05/14/2015  FINDINGS: Cardiomediastinal silhouette is stable. No acute infiltrate or pleural effusion. No pulmonary edema. Stable chronic mild interstitial prominence.  IMPRESSION: No active disease.  Stable chronic mild and interstitial prominence.   Electronically Signed   By: Lahoma Crocker M.D.   On: 05/19/2015 08:36   Dg Chest Port 1 View  05/14/2015   CLINICAL DATA:  Cough, no congestion, vomiting x 2 weeks, no known feverHx of HTN- on meds, CHF  EXAM: PORTABLE CHEST - 1 VIEW  COMPARISON:  02/17/2015  FINDINGS: Cardiac silhouette normal in size and configuration. No mediastinal hilar masses or convincing adenopathy.  There are prominent interstitial markings, stable from the prior study. No lung consolidation or edema. No pleural effusion or pneumothorax.  Bony thorax is grossly intact.  IMPRESSION: No acute cardiopulmonary disease.   Electronically Signed   By: Lajean Manes M.D.   On: 05/14/2015 11:36    CBC  Recent Labs Lab 05/19/15 0809 05/21/15 0333  WBC 4.9 4.6  HGB 13.1 12.9*  HCT 39.4 39.7  PLT 182 191  MCV 85.5 86.5  MCH 28.4 28.1  MCHC 33.2 32.5  RDW 14.0 14.2  LYMPHSABS 1.6  --   MONOABS 0.3  --   EOSABS 0.1  --   BASOSABS 0.0  --     Chemistries   Recent Labs Lab 05/19/15 0809 05/19/15 1057 05/20/15 0605 05/21/15 0033 05/21/15 0333  NA 140  --  138  --  140  K 3.1*   --  3.4*  --  3.7  CL 104  --  102  --  101  CO2 24  --  26  --  29  GLUCOSE 121*  --  107*  --  104*  BUN 16  --  21*  --  22*  CREATININE 1.65*  --  1.70*  --  1.66*  CALCIUM 8.5*  --  8.6*  --  8.7*  MG  --  1.5*  --  1.9  --   AST 36  --   --   --   --   ALT 18  --   --   --   --   ALKPHOS 76  --   --   --   --   BILITOT 0.9  --   --   --   --    ------------------------------------------------------------------------------------------------------------------ estimated creatinine clearance is 34.2 mL/min (by C-G formula based on Cr of 1.66). ------------------------------------------------------------------------------------------------------------------ No results for input(s): HGBA1C in the last 72 hours. ------------------------------------------------------------------------------------------------------------------ No results for input(s): CHOL, HDL, LDLCALC, TRIG, CHOLHDL, LDLDIRECT in the last 72 hours. ------------------------------------------------------------------------------------------------------------------  Recent Labs  05/19/15 1429  TSH 2.690   ------------------------------------------------------------------------------------------------------------------ No results for input(s): VITAMINB12, FOLATE, FERRITIN, TIBC, IRON, RETICCTPCT in the last 72 hours.  Coagulation profile No results for input(s): INR, PROTIME in the last 168 hours.  No results for input(s): DDIMER in the last 72 hours.  Cardiac Enzymes  Recent Labs Lab 05/19/15 0809 05/19/15 1429 05/19/15 2003  TROPONINI 0.07* 0.07* 0.07*   ------------------------------------------------------------------------------------------------------------------ Invalid input(s): POCBNP    Billy Parker D.O. on 05/21/2015 at 11:22 AM  Between 7am to 7pm - Pager - [phone]  After 7pm go to [email or web address] - password [email or web address]  And look for the night coverage person covering for me after  hours  [facility] Office  [phone]  

## 2015-05-21 NOTE — Progress Notes (Signed)
SUBJECTIVE: The patient is doing well today.  SOB is better.  At this time, he denies chest pain,   or any new concerns.  Marland Kitchen aspirin EC  81 mg Oral Daily  . carvedilol  6.25 mg Oral BID WC  . cefTRIAXone (ROCEPHIN)  IV  1 g Intravenous Q24H  . folic acid  1 mg Oral Daily  . furosemide  80 mg Intravenous Q12H  . heparin  5,000 Units Subcutaneous 3 times per day  . isosorbide mononitrate  15 mg Oral Daily  . multivitamin with minerals  1 tablet Oral Daily  . potassium chloride  40 mEq Oral Daily  . sodium chloride  3 mL Intravenous Q12H  . thiamine  100 mg Oral Daily   Or  . thiamine  100 mg Intravenous Daily      OBJECTIVE: Physical Exam: Filed Vitals:   05/20/15 1403 05/20/15 1738 05/20/15 2234 05/21/15 0644  BP: 98/70 119/86 107/67 111/88  Pulse: 68 75 63 77  Temp: 97.8 F (36.6 C)  97.6 F (36.4 C) 97.9 F (36.6 C)  TempSrc: Oral  Oral Oral  Resp: 20  18 20   Height:      Weight:    150 lb 1.6 oz (68.085 kg)  SpO2: 99%  99% 93%    Intake/Output Summary (Last 24 hours) at 05/21/15 1319 Last data filed at 05/21/15 1304  Gross per 24 hour  Intake   1200 ml  Output   1575 ml  Net   -375 ml    Telemetry reveals sinus rhythm  GEN- The patient is elderly but pleasant appearing, alert and oriented x 3 today.   Head- normocephalic, atraumatic Eyes-  Sclera clear, conjunctiva pink Ears- hearing intact Oropharynx- clear Neck- supple, + JVD Lungs- few basilar rales, normal work of breathing Heart- Regular rate and rhythm, no murmurs, rubs or gallops, PMI not laterally displaced GI- soft, NT, + distended/ fluid, + BS Extremities- no clubbing, cyanosis, trace edema Skin- no rash or lesion Psych- euthymic mood, full affect Neuro- strength and sensation are intact  LABS: Basic Metabolic Panel:  Recent Labs  05/19/15 1057 05/20/15 0605 05/21/15 0033 05/21/15 0333  NA  --  138  --  140  K  --  3.4*  --  3.7  CL  --  102  --  101  CO2  --  26  --  29  GLUCOSE   --  107*  --  104*  BUN  --  21*  --  22*  CREATININE  --  1.70*  --  1.66*  CALCIUM  --  8.6*  --  8.7*  MG 1.5*  --  1.9  --    Liver Function Tests:  Recent Labs  05/19/15 0809  AST 36  ALT 18  ALKPHOS 76  BILITOT 0.9  PROT 6.8  ALBUMIN 2.8*   No results for input(s): LIPASE, AMYLASE in the last 72 hours. CBC:  Recent Labs  05/19/15 0809 05/21/15 0333  WBC 4.9 4.6  NEUTROABS 3.0  --   HGB 13.1 12.9*  HCT 39.4 39.7  MCV 85.5 86.5  PLT 182 191   Cardiac Enzymes:  Recent Labs  05/19/15 0809 05/19/15 1429 05/19/15 2003  TROPONINI 0.07* 0.07* 0.07*   Thyroid Function Tests:  Recent Labs  05/19/15 1429  TSH 2.690   Anemia Panel: No results for input(s): VITAMINB12, FOLATE, FERRITIN, TIBC, IRON, RETICCTPCT in the last 72 hours.  RADIOLOGY: Dg Chest 2 View  05/19/2015  CLINICAL DATA:  Shortness of breath, dizziness starting Saturday  EXAM: CHEST  2 VIEW  COMPARISON:  05/14/2015  FINDINGS: Cardiomediastinal silhouette is stable. No acute infiltrate or pleural effusion. No pulmonary edema. Stable chronic mild interstitial prominence.  IMPRESSION: No active disease.  Stable chronic mild and interstitial prominence.   Electronically Signed   By: Lahoma Crocker M.D.   On: 05/19/2015 08:36   Dg Chest Port 1 View  05/14/2015   CLINICAL DATA:  Cough, no congestion, vomiting x 2 weeks, no known feverHx of HTN- on meds, CHF  EXAM: PORTABLE CHEST - 1 VIEW  COMPARISON:  02/17/2015  FINDINGS: Cardiac silhouette normal in size and configuration. No mediastinal hilar masses or convincing adenopathy.  There are prominent interstitial markings, stable from the prior study. No lung consolidation or edema. No pleural effusion or pneumothorax.  Bony thorax is grossly intact.  IMPRESSION: No acute cardiopulmonary disease.   Electronically Signed   By: Lajean Manes M.D.   On: 05/14/2015 11:36    ASSESSMENT AND PLAN:  Principal Problem:   Acute on chronic combined systolic and  diastolic CHF (congestive heart failure) Active Problems:   Frequent PVCs   Essential hypertension   NICM- severe LVD by echo 05/19/15   H/O Cocaine abuse   Mitral regurgitation- moderate   CAD- non obstructive 2014   CKD (chronic kidney disease) stage 3, GFR 30-59 ml/min   UTI (lower urinary tract infection)   Alcohol dependence   Elevated troponin - in setting of A on C Combined CHF   Prolonged Q-T interval on ECG   Hypokalemia   Hypoalbuminemia   Diastolic dysfunction-grade 3 on echo 05/19/15   Malnutrition of moderate degree   Nonischemic cardiomyopathy  1. Acute on chronic combined systolic/ diastolic CHF Management has been limited by noncompliance Continue IV diuresis for now Will need to take medicines at home or he will not do well long term.  I have discussed at length with him today.  He states "Dr Ellyn Hack is my buddy,  I am going to stick with him".  2. HTN Stable No change required today  3. Prior polysubstance abuse Importance of cessation is discussed  4. UTI On antibiotics  Continue IV diuresis another day Hopefully can convert to oral lasix soon   I would NOT recommend primary prevention ICD for this noncompliant elderly gentleman.  We do not have any real data to suggest clinical benefit in this patient cohort.  His best chance a long survival is with real lifestyle change.   Thompson Grayer, MD 05/21/2015 1:19 PM

## 2015-05-22 DIAGNOSIS — I509 Heart failure, unspecified: Secondary | ICD-10-CM

## 2015-05-22 LAB — BASIC METABOLIC PANEL
ANION GAP: 15 (ref 5–15)
BUN: 22 mg/dL — AB (ref 6–20)
CALCIUM: 9.3 mg/dL (ref 8.9–10.3)
CO2: 23 mmol/L (ref 22–32)
Chloride: 99 mmol/L — ABNORMAL LOW (ref 101–111)
Creatinine, Ser: 1.66 mg/dL — ABNORMAL HIGH (ref 0.61–1.24)
GFR calc Af Amer: 45 mL/min — ABNORMAL LOW (ref >60)
GFR, EST NON AFRICAN AMERICAN: 38 mL/min — AB (ref >60)
Glucose, Bld: 109 mg/dL — ABNORMAL HIGH (ref 65–99)
Potassium: 4 mmol/L (ref 3.5–5.1)
Sodium: 137 mmol/L (ref 135–145)

## 2015-05-22 LAB — MAGNESIUM: MAGNESIUM: 2 mg/dL (ref 1.7–2.4)

## 2015-05-22 MED ORDER — FUROSEMIDE 80 MG PO TABS
80.0000 mg | ORAL_TABLET | Freq: Two times a day (BID) | ORAL | Status: DC
  Administered 2015-05-22 – 2015-05-23 (×3): 80 mg via ORAL
  Filled 2015-05-22 (×5): qty 1

## 2015-05-22 NOTE — Progress Notes (Signed)
SUBJECTIVE: The patient is doing well today.  SOB is better.  At this time, he denies chest pain,   or any new concerns.  Marland Kitchen aspirin EC  81 mg Oral Daily  . carvedilol  6.25 mg Oral BID WC  . cefTRIAXone (ROCEPHIN)  IV  1 g Intravenous Q24H  . folic acid  1 mg Oral Daily  . furosemide  80 mg Oral BID  . heparin  5,000 Units Subcutaneous 3 times per day  . isosorbide mononitrate  15 mg Oral Daily  . multivitamin with minerals  1 tablet Oral Daily  . potassium chloride  40 mEq Oral Daily  . sodium chloride  3 mL Intravenous Q12H  . thiamine  100 mg Oral Daily   Or  . thiamine  100 mg Intravenous Daily      OBJECTIVE: Physical Exam: Filed Vitals:   05/21/15 0644 05/21/15 1349 05/21/15 2056 05/22/15 0506  BP: 111/88 107/61 102/63 113/68  Pulse: 77 70 62 73  Temp: 97.9 F (36.6 C) 98.3 F (36.8 C) 98.5 F (36.9 C) 97.4 F (36.3 C)  TempSrc: Oral Oral Oral Oral  Resp: 20 20 18 18   Height:      Weight: 150 lb 1.6 oz (68.085 kg)   147 lb 8 oz (66.906 kg)  SpO2: 93% 95% 98% 95%    Intake/Output Summary (Last 24 hours) at 05/22/15 0951 Last data filed at 05/22/15 0859  Gross per 24 hour  Intake    840 ml  Output   2475 ml  Net  -1635 ml    Telemetry reveals sinus rhythm, NSVT noted  GEN- The patient is elderly but pleasant appearing, alert and oriented x 3 today.   Head- normocephalic, atraumatic Eyes-  Sclera clear, conjunctiva pink Ears- hearing intact Oropharynx- clear Neck- supple, + JVD Lungs- CTAB, normal work of breathing Heart- Regular rate and rhythm, no murmurs, rubs or gallops, PMI not laterally displaced GI- soft, NT, + distended/ fluid, + BS Extremities- no clubbing, cyanosis, trace edema Skin- no rash or lesion Psych- euthymic mood, full affect Neuro- strength and sensation are intact  LABS: Basic Metabolic Panel:  Recent Labs  05/21/15 0033 05/21/15 0333 05/22/15 0626  NA  --  140 137  K  --  3.7 4.0  CL  --  101 99*  CO2  --  29 23    GLUCOSE  --  104* 109*  BUN  --  22* 22*  CREATININE  --  1.66* 1.66*  CALCIUM  --  8.7* 9.3  MG 1.9  --  2.0   Liver Function Tests: No results for input(s): AST, ALT, ALKPHOS, BILITOT, PROT, ALBUMIN in the last 72 hours. No results for input(s): LIPASE, AMYLASE in the last 72 hours. CBC:  Recent Labs  05/21/15 0333  WBC 4.6  HGB 12.9*  HCT 39.7  MCV 86.5  PLT 191   Cardiac Enzymes:  Recent Labs  05/19/15 1429 05/19/15 2003  TROPONINI 0.07* 0.07*   Thyroid Function Tests:  Recent Labs  05/19/15 1429  TSH 2.690   Anemia Pa  ASSESSMENT AND PLAN:  Principal Problem:   Acute on chronic combined systolic and diastolic CHF (congestive heart failure) Active Problems:   Frequent PVCs   Essential hypertension   NICM- severe LVD by echo 05/19/15   H/O Cocaine abuse   Mitral regurgitation- moderate   CAD- non obstructive 2014   CKD (chronic kidney disease) stage 3, GFR 30-59 ml/min   UTI (lower urinary  tract infection)   Alcohol dependence   Elevated troponin - in setting of A on C Combined CHF   Prolonged Q-T interval on ECG   Hypokalemia   Hypoalbuminemia   Diastolic dysfunction-grade 3 on echo 05/19/15   Malnutrition of moderate degree   Nonischemic cardiomyopathy  1. Acute on chronic combined systolic/ diastolic CHF Management has been limited by noncompliance Change to oral lasix today Will need to take medicines at home or he will not do well long term.  I have discussed at length with him today.  Hope to discharge tomorrow  2. HTN Stable No change required today  3. Prior polysubstance abuse Importance of cessation is discussed  4. UTI On antibiotics  5. NSVT Asymptomatic Continue coreg, replete electrolytes  I would NOT recommend primary prevention ICD for this noncompliant elderly gentleman.  We do not have any real data to suggest clinical benefit in this patient cohort.  His best chance a long survival is with real lifestyle change.      Thompson Grayer, MD 05/22/2015 9:51 AM

## 2015-05-22 NOTE — Progress Notes (Signed)
[facility]                                                                              Patient Demographics  Billy Parker, is a 79 y.o. male, DOB - 01/03/1936, EPP:[id number]  Admit date - 05/19/2015   Admitting Physician Janece Canterbury, MD  Outpatient Primary MD for the patient is Philis Fendt, MD  LOS - 3   Chief Complaint  Patient presents with  . Shortness of Breath      HPI on 05/19/2015 by [name], [place] with Dr. Janece Canterbury Billy Parker is a 79 y.o. male, with a PMH of CHF (EF of 30- 35% in 2015), poly substance abuse, adn chronic lower back pain, who presents from home alone with SOB and dizziness. Billy Parker related that approximately one month ago he started having burning with urination. This has continued and one day recently he had a great deal of blood in his urine. Approximately 2 weeks ago he began to develop SOB. He noticed he could no longer walk up the street or shop for groceries. As his SOB worsened he could no longer lie flat. He endorses PND. He developed a cough that was so severe it caused him to vomit. He began vomiting so frequently that he was unable to take his home medications (including lasix). He came to the ER on 5/14 and was diagnosed with a UTI and discharged on keflex. Unfortunately he was too SOB to have the antibiotic filled. He returned home and his breathing has progressively worsened. Billy Parker states he last used crack cocaine in December 2015, but that he still normally drinks (2) 40 ounce beers per day.  In the ER, Billy Parker has an elevated BNP of 3723 (baseline 651), creatinine is elevated at 1.65 from 1.29, Troponin is mildly elevated at 0.07, albumin and potassium are both low. CXR is positive for vascular congestion.  Assessment & Plan   Acute on chronic combined systolic/diastolic heart failure exacerbation -Suspect due to dietary/medication noncompliance  -Echocardiogram showed grade 3 diastolic  dysfunction, diffuse hypokinesis, patients had EF of 30-35% in 2015; no mural apical thrombus -BNP 3723 -Continue to monitor intake and output, daily weight; UO 2475mL  in past 24hrs -Transitioned to PO lasix 80mg  BID -Continue Coreg, imdur -Nutrition consulted  -Cardiology consulted and appreciated   NSVT -Patient had 14 beat run of V. tach overnight however remained asymptomatic -Spoke with cardiology regarding this, patient is not an ICD candidate due to noncompliance and age. -Magnesium 2, Potassium 4  Elevated troponin -Likely due to acute renal failure and heart failure exacerbation. -Troponin 0.07 consistently  -Patient currently denies any chest pain  -TSH 2.69  Acute on chronic renal failure (CKD Stage 3)  -Creatinine 1.66 today -Baseline appears roughly 1.2-1.4 -Continue to monitor BMP  Alcohol dependence/polysubstance abuse  -Toxicology negative  -Patient counseled on smoking, polysubstance abuse and alcohol cessation  -Continue CIWA protocol  Hypertension -Currently stable, continue Lasix and coreg  Hypoalbuminemia/malnutrition -Likely protein calorie malnutrition secondary to poor by mouth intake and drinking alcohol regularly. -Nutrition consulted   Hypokalemia -K 4.0, likely secondary to diuresis -Magnesium 2 -Continue to monitor and repalce as needed  UTI -UA: WBC 11-20, few bacteria, trace leukocytes -Continue ceftriaxone -Urine culture: Greater than 75,000 colonies of Escherichia coli- pansensitive  Code Status: Full  Family Communication: None at bedside  Disposition Plan: Admitted, pending further recommendations from cardiology  Time Spent in minutes   30 minutes  Procedures  Echocardiogram  Consults   Cardiology  DVT Prophylaxis  Heparin  Lab Results  Component Value Date   PLT 191 05/21/2015    Medications  Scheduled Meds: . aspirin EC  81 mg Oral Daily  . carvedilol  6.25 mg Oral BID WC  . cefTRIAXone (ROCEPHIN)  IV  1 g  Intravenous Q24H  . folic acid  1 mg Oral Daily  . furosemide  80 mg Oral BID  . heparin  5,000 Units Subcutaneous 3 times per day  . isosorbide mononitrate  15 mg Oral Daily  . multivitamin with minerals  1 tablet Oral Daily  . potassium chloride  40 mEq Oral Daily  . sodium chloride  3 mL Intravenous Q12H  . thiamine  100 mg Oral Daily   Or  . thiamine  100 mg Intravenous Daily   Continuous Infusions:  PRN Meds:.sodium chloride, acetaminophen, LORazepam **OR** LORazepam, sodium chloride  Antibiotics    Anti-infectives    Start     Dose/Rate Route Frequency Ordered Stop   05/19/15 1400  cefTRIAXone (ROCEPHIN) 1 g in dextrose 5 % 50 mL IVPB - Premix     1 g 100 mL/hr over 30 Minutes Intravenous Every 24 hours 05/19/15 1224        Subjective:   Billy Parker seen and examined today.  Patient feels his shortness of breath has improved. He currently denies chest pain, abdominal pain, nausea, vomiting, dizziness and headache.   Objective:   Filed Vitals:   05/21/15 1349 05/21/15 2056 05/22/15 0506 05/22/15 1017  BP: 107/61 102/63 113/68 124/86  Pulse: 70 62 73 60  Temp: 98.3 F (36.8 C) 98.5 F (36.9 C) 97.4 F (36.3 C) 97.7 F (36.5 C)  TempSrc: Oral Oral Oral Oral  Resp: 20 18 18 18   Height:      Weight:   66.906 kg (147 lb 8 oz)   SpO2: 95% 98% 95% 96%    Wt Readings from Last 3 Encounters:  05/22/15 66.906 kg (147 lb 8 oz)  02/17/15 68.04 kg (150 lb)  10/14/14 68.04 kg (150 lb)     Intake/Output Summary (Last 24 hours) at 05/22/15 1135 Last data filed at 05/22/15 0859  Gross per 24 hour  Intake    840 ml  Output   2475 ml  Net  -1635 ml    Exam  General: Well developed, well nourished, no apparent distress  HEENT: NCAT, mucous membranes moist.   Cardiovascular: S1 S2 auscultated, RRR, no murmurs  Respiratory: Clear to auscultation   Abdomen: Soft, nontender, mildly distended, + bowel sounds  Extremities: warm dry without cyanosis clubbing.  Trace LE Edema  Neuro: AAOx3, nonfocal  Psych: appropriate affect and demeanor  Data Review   Micro Results Recent Results (from the past 240 hour(s))  Urine culture     Status: None   Collection Time: 05/19/15  8:47 AM  Result Value Ref Range Status   Specimen Description URINE, CLEAN CATCH  Final   Special Requests NONE  Final   Colony Count   Final    75,000 COLONIES/ML Performed at [facility]    Culture   Final    ESCHERICHIA COLI Performed at [facility]    Report Status 05/21/2015 FINAL  Final   Organism ID, Bacteria ESCHERICHIA COLI  Final      Susceptibility   Escherichia coli - MIC*    AMPICILLIN <=2 SENSITIVE Sensitive     CEFAZOLIN <=4 SENSITIVE Sensitive     CEFTRIAXONE <=1 SENSITIVE Sensitive     CIPROFLOXACIN <=0.25 SENSITIVE Sensitive     GENTAMICIN <=1 SENSITIVE Sensitive     LEVOFLOXACIN <=0.12 SENSITIVE Sensitive     NITROFURANTOIN <=16 SENSITIVE Sensitive     TOBRAMYCIN <=1 SENSITIVE Sensitive     TRIMETH/SULFA <=20 SENSITIVE Sensitive     PIP/TAZO <=4 SENSITIVE Sensitive     * ESCHERICHIA COLI    Radiology Reports Dg Chest 2 View  05/19/2015   CLINICAL DATA:  Shortness of breath, dizziness starting Saturday  EXAM: CHEST  2 VIEW  COMPARISON:  05/14/2015  FINDINGS: Cardiomediastinal silhouette is stable. No acute infiltrate or pleural effusion. No pulmonary edema. Stable chronic mild interstitial prominence.  IMPRESSION: No active disease.  Stable chronic mild and interstitial prominence.   Electronically Signed   By: Lahoma Crocker M.D.   On: 05/19/2015 08:36   Dg Chest Port 1 View  05/14/2015   CLINICAL DATA:  Cough, no congestion, vomiting x 2 weeks, no known feverHx of HTN- on meds, CHF  EXAM: PORTABLE CHEST - 1 VIEW  COMPARISON:  02/17/2015  FINDINGS: Cardiac silhouette normal in size and configuration. No mediastinal hilar masses or convincing adenopathy.  There are prominent interstitial markings, stable from the prior study. No  lung consolidation or edema. No pleural effusion or pneumothorax.  Bony thorax is grossly intact.  IMPRESSION: No acute cardiopulmonary disease.   Electronically Signed   By: Lajean Manes M.D.   On: 05/14/2015 11:36    CBC  Recent Labs Lab 05/19/15 0809 05/21/15 0333  WBC 4.9 4.6  HGB 13.1 12.9*  HCT 39.4 39.7  PLT 182 191  MCV 85.5 86.5  MCH 28.4 28.1  MCHC 33.2 32.5  RDW 14.0 14.2  LYMPHSABS 1.6  --   MONOABS 0.3  --   EOSABS 0.1  --   BASOSABS 0.0  --     Chemistries   Recent Labs Lab 05/19/15 0809 05/19/15 1057 05/20/15 0605 05/21/15 0033 05/21/15 0333 05/22/15 0626  NA 140  --  138  --  140 137  K 3.1*  --  3.4*  --  3.7 4.0  CL 104  --  102  --  101 99*  CO2 24  --  26  --  29 23  GLUCOSE 121*  --  107*  --  104* 109*  BUN 16  --  21*  --  22* 22*  CREATININE 1.65*  --  1.70*  --  1.66* 1.66*  CALCIUM 8.5*  --  8.6*  --  8.7* 9.3  MG  --  1.5*  --  1.9  --  2.0  AST 36  --   --   --   --   --   ALT 18  --   --   --   --   --   ALKPHOS 76  --   --   --   --   --   BILITOT 0.9  --   --   --   --   --    ------------------------------------------------------------------------------------------------------------------ estimated creatinine clearance is 34.2 mL/min (by C-G formula based on Cr of 1.66). ------------------------------------------------------------------------------------------------------------------ No results for input(s): HGBA1C in the  last 72 hours. ------------------------------------------------------------------------------------------------------------------ No results for input(s): CHOL, HDL, LDLCALC, TRIG, CHOLHDL, LDLDIRECT in the last 72 hours. ------------------------------------------------------------------------------------------------------------------  Recent Labs  05/19/15 1429  TSH 2.690   ------------------------------------------------------------------------------------------------------------------ No results for  input(s): VITAMINB12, FOLATE, FERRITIN, TIBC, IRON, RETICCTPCT in the last 72 hours.  Coagulation profile No results for input(s): INR, PROTIME in the last 168 hours.  No results for input(s): DDIMER in the last 72 hours.  Cardiac Enzymes  Recent Labs Lab 05/19/15 0809 05/19/15 1429 05/19/15 2003  TROPONINI 0.07* 0.07* 0.07*   ------------------------------------------------------------------------------------------------------------------ Invalid input(s): POCBNP    Waniya Hoglund D.O. on 05/22/2015 at 11:35 AM  Between 7am to 7pm - Pager - [phone]  After 7pm go to [email or web address] - password [email or web address]  And look for the night coverage person covering for me after hours  [facility] Office  [phone]

## 2015-05-23 LAB — BASIC METABOLIC PANEL
Anion gap: 12 (ref 5–15)
BUN: 23 mg/dL — ABNORMAL HIGH (ref 6–20)
CO2: 26 mmol/L (ref 22–32)
Calcium: 9.3 mg/dL (ref 8.9–10.3)
Chloride: 100 mmol/L — ABNORMAL LOW (ref 101–111)
Creatinine, Ser: 1.61 mg/dL — ABNORMAL HIGH (ref 0.61–1.24)
GFR calc Af Amer: 46 mL/min — ABNORMAL LOW (ref >60)
GFR, EST NON AFRICAN AMERICAN: 40 mL/min — AB (ref >60)
GLUCOSE: 114 mg/dL — AB (ref 65–99)
Potassium: 3.8 mmol/L (ref 3.5–5.1)
SODIUM: 138 mmol/L (ref 135–145)

## 2015-05-23 LAB — CBC
HCT: 43.6 % (ref 39.0–52.0)
HEMOGLOBIN: 14.6 g/dL (ref 13.0–17.0)
MCH: 28.7 pg (ref 26.0–34.0)
MCHC: 33.5 g/dL (ref 30.0–36.0)
MCV: 85.8 fL (ref 78.0–100.0)
Platelets: 186 10*3/uL (ref 150–400)
RBC: 5.08 MIL/uL (ref 4.22–5.81)
RDW: 14.1 % (ref 11.5–15.5)
WBC: 4.2 10*3/uL (ref 4.0–10.5)

## 2015-05-23 MED ORDER — POTASSIUM CHLORIDE CRYS ER 20 MEQ PO TBCR: 40.0000 meq | EXTENDED_RELEASE_TABLET | Freq: Every day | ORAL | Status: DC

## 2015-05-23 MED ORDER — THIAMINE HCL 100 MG PO TABS: 100.0000 mg | ORAL_TABLET | Freq: Every day | ORAL | Status: DC

## 2015-05-23 MED ORDER — CEFUROXIME AXETIL 500 MG PO TABS: 500.0000 mg | ORAL_TABLET | Freq: Two times a day (BID) | ORAL | Status: DC

## 2015-05-23 MED ORDER — FUROSEMIDE 80 MG PO TABS: 80.0000 mg | ORAL_TABLET | Freq: Two times a day (BID) | ORAL | Status: DC

## 2015-05-23 MED ORDER — ADULT MULTIVITAMIN W/MINERALS CH: 1.0000 | ORAL_TABLET | Freq: Every day | ORAL | Status: AC

## 2015-05-23 MED ORDER — FOLIC ACID 1 MG PO TABS: 1.0000 mg | ORAL_TABLET | Freq: Every day | ORAL | Status: DC

## 2015-05-23 MED ORDER — CARVEDILOL 6.25 MG PO TABS: 6.2500 mg | ORAL_TABLET | Freq: Two times a day (BID) | ORAL | Status: DC

## 2015-05-23 MED ORDER — ISOSORBIDE MONONITRATE ER 30 MG PO TB24: 15.0000 mg | ORAL_TABLET | Freq: Every day | ORAL | Status: DC

## 2015-05-23 MED ORDER — ASPIRIN 81 MG PO TBEC: 81.0000 mg | DELAYED_RELEASE_TABLET | Freq: Every day | ORAL | Status: DC

## 2015-05-23 NOTE — Progress Notes (Signed)
SUBJECTIVE:  The patient is a 79 year old male with past medical history of nonischemic cardiomyopathy with ejection fraction of 30-35% on echocardiogram from 2015 secondary to cocaine abuse and alcohol use.    The patient is doing well today.  SOB is better.  At this time, he denies chest pain,   or any new concerns.  Marland Kitchen aspirin EC  81 mg Oral Daily  . carvedilol  6.25 mg Oral BID WC  . cefTRIAXone (ROCEPHIN)  IV  1 g Intravenous Q24H  . folic acid  1 mg Oral Daily  . furosemide  80 mg Oral BID  . heparin  5,000 Units Subcutaneous 3 times per day  . isosorbide mononitrate  15 mg Oral Daily  . multivitamin with minerals  1 tablet Oral Daily  . potassium chloride  40 mEq Oral Daily  . sodium chloride  3 mL Intravenous Q12H  . thiamine  100 mg Oral Daily   Or  . thiamine  100 mg Intravenous Daily      OBJECTIVE: Physical Exam: Filed Vitals:   05/22/15 0506 05/22/15 1017 05/22/15 1508 05/22/15 2031  BP: 113/68 124/86 102/76 119/58  Pulse: 73 60 63 68  Temp: 97.4 F (36.3 C) 97.7 F (36.5 C) 97.5 F (36.4 C) 97.6 F (36.4 C)  TempSrc: Oral Oral Oral Oral  Resp: 18 18 20 18   Height:      Weight: 66.906 kg (147 lb 8 oz)     SpO2: 95% 96% 94% 97%    Intake/Output Summary (Last 24 hours) at 05/23/15 0915 Last data filed at 05/23/15 0423  Gross per 24 hour  Intake   1010 ml  Output    425 ml  Net    585 ml    Telemetry reveals sinus rhythm  GEN- The patient is elderly but pleasant appearing, alert and oriented x 3 today.   Head- normocephalic, atraumatic Eyes-  Sclera clear, conjunctiva pink Ears- hearing intact Oropharynx- clear Neck- supple, + JVD Lungs- few basilar rales, normal work of breathing Heart- Regular rate and rhythm, no murmurs, rubs or gallops, PMI not laterally displaced GI- soft, NT, + distended/ fluid, + BS Extremities- no clubbing, cyanosis, trace edema Skin- no rash or lesion Psych- euthymic mood, full affect Neuro- strength and sensation  are intact  LABS: Basic Metabolic Panel:  Recent Labs  05/21/15 0033  05/22/15 0626 05/23/15 0328  NA  --   < > 137 138  K  --   < > 4.0 3.8  CL  --   < > 99* 100*  CO2  --   < > 23 26  GLUCOSE  --   < > 109* 114*  BUN  --   < > 22* 23*  CREATININE  --   < > 1.66* 1.61*  CALCIUM  --   < > 9.3 9.3  MG 1.9  --  2.0  --   < > = values in this interval not displayed. Liver Function Tests: No results for input(s): AST, ALT, ALKPHOS, BILITOT, PROT, ALBUMIN in the last 72 hours. No results for input(s): LIPASE, AMYLASE in the last 72 hours. CBC:  Recent Labs  05/21/15 0333 05/23/15 0328  WBC 4.6 4.2  HGB 12.9* 14.6  HCT 39.7 43.6  MCV 86.5 85.8  PLT 191 186   Cardiac Enzymes: No results for input(s): CKTOTAL, CKMB, CKMBINDEX, TROPONINI in the last 72 hours. Thyroid Function Tests: No results for input(s): TSH, T4TOTAL, T3FREE, THYROIDAB in the last 72  hours.  Invalid input(s): FREET3 Anemia Panel: No results for input(s): VITAMINB12, FOLATE, FERRITIN, TIBC, IRON, RETICCTPCT in the last 72 hours.  RADIOLOGY: Dg Chest 2 View  05/19/2015   CLINICAL DATA:  Shortness of breath, dizziness starting Saturday  EXAM: CHEST  2 VIEW  COMPARISON:  05/14/2015  FINDINGS: Cardiomediastinal silhouette is stable. No acute infiltrate or pleural effusion. No pulmonary edema. Stable chronic mild interstitial prominence.  IMPRESSION: No active disease.  Stable chronic mild and interstitial prominence.   Electronically Signed   By: Lahoma Crocker M.D.   On: 05/19/2015 08:36   Dg Chest Port 1 View  05/14/2015   CLINICAL DATA:  Cough, no congestion, vomiting x 2 weeks, no known feverHx of HTN- on meds, CHF  EXAM: PORTABLE CHEST - 1 VIEW  COMPARISON:  02/17/2015  FINDINGS: Cardiac silhouette normal in size and configuration. No mediastinal hilar masses or convincing adenopathy.  There are prominent interstitial markings, stable from the prior study. No lung consolidation or edema. No pleural effusion or  pneumothorax.  Bony thorax is grossly intact.  IMPRESSION: No acute cardiopulmonary disease.   Electronically Signed   By: Lajean Manes M.D.   On: 05/14/2015 11:36    ASSESSMENT AND PLAN:  Principal Problem:   Acute on chronic combined systolic and diastolic CHF (congestive heart failure) Active Problems:   Frequent PVCs   Essential hypertension   NICM- severe LVD by echo 05/19/15   H/O Cocaine abuse   Mitral regurgitation- moderate   CAD- non obstructive 2014   CKD (chronic kidney disease) stage 3, GFR 30-59 ml/min   UTI (lower urinary tract infection)   Alcohol dependence   Elevated troponin - in setting of A on C Combined CHF   Prolonged Q-T interval on ECG   Hypokalemia   Hypoalbuminemia   Diastolic dysfunction-grade 3 on echo 05/19/15   Malnutrition of moderate degree   Nonischemic cardiomyopathy   Congestive heart disease  1. Acute on chronic combined systolic/ diastolic CHF Management has been limited by noncompliance Ok forDC today  Will need to take medicines at home or he will not do well long term.  I have discussed at length with him today.    He may follow up with Dr. Ellyn Hack   2. HTN Stable No change required today  3. Prior polysubstance abuse Importance of cessation is discussed  4. UTI On antibiotics  Continue IV diuresis another day Hopefully can convert to oral lasix soon   I would NOT recommend primary prevention ICD for this noncompliant elderly gentleman.  We do not have any real data to suggest clinical benefit in this patient cohort.  His best chance a long survival is with real lifestyle change.     Nahser, Wonda Cheng, MD  05/23/2015 9:20 AM    [facility] [place],  [place], [place]  [address] Pager [phone] Phone: [phone]; Fax: [phone]   [facility]  [address] [place]  [address] [phone]    Fax [phone]

## 2015-05-23 NOTE — Discharge Summary (Signed)
Physician Discharge Summary  Rashad Auld ACZ:[id number] DOB: September 27, 1936 DOA: 05/19/2015  PCP: Philis Fendt, MD  Admit date: 05/19/2015 Discharge date: 05/23/2015  Time spent: 45 minutes  Recommendations for Outpatient Follow-up:  Patient will be discharged to home.  Patient will need to follow up with primary care provider within one week of discharge.  Patient will also need to follow up with cardiology, Dr. Ellyn Hack, office will contact for appointment. Patient should continue medications as prescribed.  Patient should follow a heart healthy diet with 1534ml fluid restriction per day.  Avoid cocaine, alcohol, tobacco products.  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  Filed Weights   05/21/15 0644 05/22/15 0506 05/23/15 0938  Weight: 68.085 kg (150 lb 1.6 oz) 66.906 kg (147 lb 8 oz) 67.178 kg (148 lb 1.6 oz)    History of present illness:  on 05/19/2015 by [name], [place] with Dr. Janece Canterbury Loye Vento is a 79 y.o. male, with a PMH of CHF (EF of 30- 35% in 2015), poly substance abuse, adn chronic lower back pain, who presents from home alone with SOB and dizziness. Mr. Vanduyne related that approximately one month ago he started having burning with urination. This has continued and one day recently he had a great deal of blood in his urine. Approximately 2 weeks ago he began to develop SOB. He noticed he could no longer walk up the street or shop for groceries. As his SOB worsened he could no longer lie flat. He endorses PND. He developed a cough that was so severe it caused him to vomit. He began vomiting so frequently  that he was unable to take his home medications (including lasix). He came to the ER on 5/14 and was diagnosed with a UTI and discharged on keflex. Unfortunately he was too SOB to have the antibiotic filled. He returned home and his breathing has progressively worsened. Mr. Mcglasson states he last used crack cocaine in December 2015, but that he still normally drinks (2) 40 ounce beers per day.  In the ER, Mr. Seawood has an elevated BNP of 3723 (baseline 651), creatinine is elevated at 1.65 from 1.29, Troponin is mildly elevated at 0.07, albumin and potassium are both low. CXR is positive for vascular congestion.  Hospital Course:  Acute on chronic combined systolic/diastolic heart failure exacerbation -Suspect due to dietary/medication noncompliance  -Echocardiogram showed grade 3 diastolic dysfunction, diffuse hypokinesis, patients had EF of 30-35% in 2015; no mural apical thrombus -BNP 3723 -Monitored intake and output, daily weight -Transitioned to PO lasix 80mg  BID -Continue Coreg, imdur -Nutrition consulted  -Cardiology consulted and appreciated   NSVT -Patient had 14 beat run of V. tach overnight however remained asymptomatic -Spoke with cardiology regarding this, patient is not an ICD candidate due to noncompliance and age. -Magnesium 2, Potassium 4  Elevated troponin -Likely due to acute renal failure and heart failure exacerbation. -Troponin 0.07 consistently  -Patient currently denies any chest pain  -TSH 2.69  Acute on chronic renal failure (CKD Stage 3)  -Creatinine 1.61 today -Baseline appears roughly 1.2-1.4 -Continue to monitor BMP  Alcohol dependence/polysubstance abuse  -Toxicology negative  -Patient counseled on smoking, polysubstance abuse and alcohol cessation  -Continue CIWA protocol  Hypertension -Currently stable, continue Lasix and coreg  Hypoalbuminemia/malnutrition -Likely protein calorie malnutrition secondary to poor by mouth intake and  drinking alcohol regularly. -Nutrition consulted   Hypokalemia -K 4.0, likely secondary to diuresis -Magnesium 2 -Continue to monitor and repalce as needed  UTI -UA: WBC 11-20, few bacteria, trace leukocytes -Initially placed on ceftriaxone, continue ceftin 500mg  BID -Urine culture: Greater than 75,000 colonies of Escherichia coli- pansensitive  Procedures  Echocardiogram  Consults  Cardiology  Discharge Exam: Filed Vitals:   05/23/15 1014  BP: 97/70  Pulse: 78  Temp:   Resp:    Exam  General: Well developed, no apparent distress  HEENT: NCAT, mucous membranes moist.   Cardiovascular: S1 S2 auscultated, RRR, no murmurs  Respiratory: Clear to auscultation  Abdomen: Soft, nontender, mildly distended, + bowel sounds  Extremities: warm dry without cyanosis clubbing, edema  Neuro: AAOx3, nonfocal  Psych: appropriate affect and demeanor, pleasant  Discharge Instructions      Discharge Instructions    Discharge instructions    Complete by:  As directed   Patient will be discharged to home.  Patient will need to follow up with primary care provider within one week of discharge.  Patient will also need to follow up with cardiology, Dr. Ellyn Hack, office will contact for appointment. Patient should continue medications as prescribed.  Patient should follow a heart healthy diet with 1556ml fluid restriction per day.  Avoid cocaine, alcohol, tobacco products.  You were cared for by a hospitalist during your hospital stay. If you have any questions about your discharge medications or the care you received while you were in the hospital after you are discharged, you can call the unit and asked to speak with the hospitalist on call if the hospitalist that took care of you is not available. Once you are discharged, your primary care physician will handle any further medical issues. Please note that NO REFILLS for any discharge medications will be authorized once you are  discharged, as it is imperative that you return to your primary care physician (or establish a relationship with a primary care physician if you do not have one) for your aftercare needs so that they can reassess your need for medications and monitor your lab values.            Medication List    STOP taking these medications        hydrochlorothiazide 12.5 MG tablet  Commonly known as:  HYDRODIURIL      TAKE these medications        albuterol 108 (90 BASE) MCG/ACT inhaler  Commonly known as:  PROVENTIL HFA;VENTOLIN HFA  Inhale 1-2 puffs into the lungs every 6 (six) hours as needed for wheezing or shortness of breath.     aspirin 81 MG EC tablet  Take 1 tablet (81 mg total) by mouth daily.     carvedilol 6.25 MG tablet  Commonly known as:  COREG  Take 1 tablet (6.25 mg total) by mouth 2 (two) times daily with a meal.     cefUROXime 500 MG tablet  Commonly known as:  CEFTIN  Take 1 tablet (500 mg total) by mouth 2 (two) times daily with a meal.     folic acid 1 MG tablet  Commonly known as:  FOLVITE  Take 1 tablet (1 mg total) by mouth daily.     furosemide 80 MG tablet  Commonly known as:  LASIX  Take 1 tablet (80 mg total) by mouth 2 (two) times daily.     isosorbide mononitrate 30  MG 24 hr tablet  Commonly known as:  IMDUR  Take 0.5 tablets (15 mg total) by mouth daily.     multivitamin with minerals Tabs tablet  Take 1 tablet by mouth daily.     potassium chloride SA 20 MEQ tablet  Commonly known as:  K-DUR,KLOR-CON  Take 2 tablets (40 mEq total) by mouth daily.     thiamine 100 MG tablet  Take 1 tablet (100 mg total) by mouth daily.       Allergies  Allergen Reactions  . Other Swelling    antibiotic   Follow-up Information    Follow up with [name], Brentlee W, MD. Schedule an appointment as soon as possible for a visit in 2 weeks.   Specialty:  Cardiology   Why:  Hospital follow up, CHF    Contact information:   [address] [place] [address] [phone]       Follow up with Nolene Ebbs A, MD. Schedule an appointment as soon as possible for a visit in 1 week.   Specialty:  Internal Medicine   Why:  Hospital follow up   Contact information:   [place] [address] [phone]        The results of significant diagnostics from this hospitalization (including imaging, microbiology, ancillary and laboratory) are listed below for reference.    Significant Diagnostic Studies: Dg Chest 2 View  05/19/2015   CLINICAL DATA:  Shortness of breath, dizziness starting Saturday  EXAM: CHEST  2 VIEW  COMPARISON:  05/14/2015  FINDINGS: Cardiomediastinal silhouette is stable. No acute infiltrate or pleural effusion. No pulmonary edema. Stable chronic mild interstitial prominence.  IMPRESSION: No active disease.  Stable chronic mild and interstitial prominence.   Electronically Signed   By: Lahoma Crocker M.D.   On: 05/19/2015 08:36   Dg Chest Port 1 View  05/14/2015   CLINICAL DATA:  Cough, no congestion, vomiting x 2 weeks, no known feverHx of HTN- on meds, CHF  EXAM: PORTABLE CHEST - 1 VIEW  COMPARISON:  02/17/2015  FINDINGS: Cardiac silhouette normal in size and configuration. No mediastinal hilar masses or convincing adenopathy.  There are prominent interstitial markings, stable from the prior study. No lung consolidation or edema. No pleural effusion or pneumothorax.  Bony thorax is grossly intact.  IMPRESSION: No acute cardiopulmonary disease.   Electronically Signed   By: Lajean Manes M.D.   On: 05/14/2015 11:36    Microbiology: Recent Results (from the past 240 hour(s))  Urine culture     Status: None   Collection Time: 05/19/15  8:47 AM  Result Value Ref Range Status   Specimen Description URINE, CLEAN CATCH  Final   Special Requests NONE  Final   Colony Count   Final    75,000 COLONIES/ML Performed at [facility]    Culture   Final    ESCHERICHIA COLI Performed at [facility]    Report Status 05/21/2015 FINAL  Final   Organism ID, Bacteria ESCHERICHIA COLI  Final      Susceptibility   Escherichia coli - MIC*    AMPICILLIN <=2 SENSITIVE Sensitive     CEFAZOLIN <=4 SENSITIVE Sensitive     CEFTRIAXONE <=1 SENSITIVE Sensitive     CIPROFLOXACIN <=0.25 SENSITIVE Sensitive     GENTAMICIN <=1 SENSITIVE Sensitive     LEVOFLOXACIN <=0.12 SENSITIVE Sensitive     NITROFURANTOIN <=16 SENSITIVE Sensitive     TOBRAMYCIN <=1 SENSITIVE Sensitive  TRIMETH/SULFA <=20 SENSITIVE Sensitive     PIP/TAZO <=4 SENSITIVE Sensitive     * ESCHERICHIA COLI     Labs: Basic Metabolic Panel:  Recent Labs Lab 05/19/15 0809 05/19/15 1057 05/20/15 0605 05/21/15 0033 05/21/15 0333 05/22/15 0626 05/23/15 0328  NA 140  --  138  --  140 137 138  K 3.1*  --  3.4*  --  3.7 4.0 3.8  CL 104  --  102  --  101 99* 100*  CO2 24  --  26  --  29 23 26   GLUCOSE 121*  --  107*  --  104* 109* 114*  BUN 16  --  21*  --  22* 22* 23*  CREATININE 1.65*  --  1.70*  --  1.66* 1.66* 1.61*  CALCIUM 8.5*  --  8.6*  --  8.7* 9.3 9.3  MG  --  1.5*  --  1.9  --  2.0  --    Liver Function Tests:  Recent Labs Lab 05/19/15 0809  AST 36  ALT 18  ALKPHOS 76  BILITOT 0.9  PROT 6.8  ALBUMIN 2.8*   No results for input(s): LIPASE, AMYLASE in the last 168 hours. No results for input(s): AMMONIA in the last 168 hours. CBC:  Recent Labs Lab 05/19/15 0809 05/21/15 0333 05/23/15 0328  WBC 4.9 4.6 4.2  NEUTROABS 3.0  --   --   HGB 13.1 12.9* 14.6  HCT 39.4 39.7 43.6  MCV 85.5 86.5 85.8  PLT 182 191 186   Cardiac Enzymes:  Recent Labs Lab 05/19/15 0809 05/19/15 1429 05/19/15 2003  TROPONINI 0.07* 0.07* 0.07*   BNP: BNP (last 3 results)  Recent Labs  02/17/15 1247 05/14/15 1119 05/19/15 0809  BNP 651.0* 2274.3* 3723.9*    ProBNP (last 3 results) No results for input(s): PROBNP in the last 8760 hours.  CBG: No results for input(s): GLUCAP in the last 168  hours.     SignedCristal Ford  [facility] 05/23/2015, 10:22 AM

## 2015-05-23 NOTE — Discharge Instructions (Signed)

## 2015-05-23 NOTE — Progress Notes (Signed)
Orders received for pt discharge.  Discharge summary printed and reviewed with pt.  Explained medication regimen, and pt had no further questions at this time.  IV removed and site remains clean, dry, intact.  Telemetry removed.  Pt in stable condition and awaiting transport. 

## 2015-05-24 ENCOUNTER — Telehealth: Payer: Self-pay | Admitting: Cardiology

## 2015-05-25 NOTE — Telephone Encounter (Signed)
Close encounter 

## 2015-06-07 ENCOUNTER — Encounter: Payer: Medicare HMO | Admitting: Nurse Practitioner

## 2015-09-18 ENCOUNTER — Encounter (HOSPITAL_COMMUNITY): Payer: Self-pay | Admitting: Emergency Medicine

## 2015-09-18 ENCOUNTER — Inpatient Hospital Stay (HOSPITAL_COMMUNITY)
Admission: EM | Admit: 2015-09-18 | Discharge: 2015-09-19 | DRG: 293 | Disposition: A | Discharge status: Home / Self Care | Payer: Medicare HMO | Attending: Family Medicine | Admitting: Family Medicine

## 2015-09-18 ENCOUNTER — Emergency Department (HOSPITAL_COMMUNITY): Payer: Medicare HMO

## 2015-09-18 DIAGNOSIS — Z9114 Patient's other noncompliance with medication regimen: Secondary | ICD-10-CM

## 2015-09-18 DIAGNOSIS — E876 Hypokalemia: Secondary | ICD-10-CM | POA: Diagnosis not present

## 2015-09-18 DIAGNOSIS — I5043 Acute on chronic combined systolic (congestive) and diastolic (congestive) heart failure: Principal | ICD-10-CM | POA: Diagnosis present

## 2015-09-18 DIAGNOSIS — Z7982 Long term (current) use of aspirin: Secondary | ICD-10-CM | POA: Diagnosis not present

## 2015-09-18 DIAGNOSIS — I255 Ischemic cardiomyopathy: Secondary | ICD-10-CM | POA: Diagnosis present

## 2015-09-18 DIAGNOSIS — G8929 Other chronic pain: Secondary | ICD-10-CM | POA: Diagnosis present

## 2015-09-18 DIAGNOSIS — F1410 Cocaine abuse, uncomplicated: Secondary | ICD-10-CM | POA: Diagnosis not present

## 2015-09-18 DIAGNOSIS — F1020 Alcohol dependence, uncomplicated: Secondary | ICD-10-CM | POA: Diagnosis present

## 2015-09-18 DIAGNOSIS — M545 Low back pain: Secondary | ICD-10-CM | POA: Diagnosis present

## 2015-09-18 DIAGNOSIS — I10 Essential (primary) hypertension: Secondary | ICD-10-CM

## 2015-09-18 DIAGNOSIS — Z881 Allergy status to other antibiotic agents status: Secondary | ICD-10-CM | POA: Diagnosis not present

## 2015-09-18 DIAGNOSIS — Z79899 Other long term (current) drug therapy: Secondary | ICD-10-CM | POA: Diagnosis not present

## 2015-09-18 DIAGNOSIS — R0602 Shortness of breath: Secondary | ICD-10-CM | POA: Diagnosis not present

## 2015-09-18 DIAGNOSIS — I2510 Atherosclerotic heart disease of native coronary artery without angina pectoris: Secondary | ICD-10-CM | POA: Diagnosis present

## 2015-09-18 DIAGNOSIS — I509 Heart failure, unspecified: Secondary | ICD-10-CM

## 2015-09-18 HISTORY — DX: Cocaine abuse, uncomplicated: F14.10

## 2015-09-18 LAB — CBC WITH DIFFERENTIAL/PLATELET
BASOS ABS: 0 10*3/uL (ref 0.0–0.1)
Basophils Relative: 0 %
Eosinophils Absolute: 0.1 10*3/uL (ref 0.0–0.7)
Eosinophils Relative: 1 %
HEMATOCRIT: 43.3 % (ref 39.0–52.0)
Hemoglobin: 14.8 g/dL (ref 13.0–17.0)
Lymphocytes Relative: 31 %
Lymphs Abs: 1.8 10*3/uL (ref 0.7–4.0)
MCH: 30.3 pg (ref 26.0–34.0)
MCHC: 34.2 g/dL (ref 30.0–36.0)
MCV: 88.5 fL (ref 78.0–100.0)
MONOS PCT: 7 %
Monocytes Absolute: 0.4 10*3/uL (ref 0.1–1.0)
NEUTROS ABS: 3.5 10*3/uL (ref 1.7–7.7)
NEUTROS PCT: 61 %
Platelets: 153 10*3/uL (ref 150–400)
RBC: 4.89 MIL/uL (ref 4.22–5.81)
RDW: 14.1 % (ref 11.5–15.5)
WBC: 5.7 10*3/uL (ref 4.0–10.5)

## 2015-09-18 LAB — RAPID URINE DRUG SCREEN, HOSP PERFORMED
AMPHETAMINES: NOT DETECTED
BARBITURATES: NOT DETECTED
BENZODIAZEPINES: NOT DETECTED
COCAINE: POSITIVE — AB
Opiates: NOT DETECTED
Tetrahydrocannabinol: NOT DETECTED

## 2015-09-18 LAB — COMPREHENSIVE METABOLIC PANEL
ALBUMIN: 3.2 g/dL — AB (ref 3.5–5.0)
ALT: 20 U/L (ref 17–63)
AST: 38 U/L (ref 15–41)
Alkaline Phosphatase: 64 U/L (ref 38–126)
Anion gap: 13 (ref 5–15)
BILIRUBIN TOTAL: 0.6 mg/dL (ref 0.3–1.2)
BUN: 10 mg/dL (ref 6–20)
CALCIUM: 9 mg/dL (ref 8.9–10.3)
CO2: 20 mmol/L — AB (ref 22–32)
Chloride: 106 mmol/L (ref 101–111)
Creatinine, Ser: 1.23 mg/dL (ref 0.61–1.24)
GFR calc Af Amer: >60 mL/min (ref >60)
GFR calc non Af Amer: 55 mL/min — ABNORMAL LOW (ref >60)
GLUCOSE: 170 mg/dL — AB (ref 65–99)
Potassium: 3.1 mmol/L — ABNORMAL LOW (ref 3.5–5.1)
Sodium: 139 mmol/L (ref 135–145)
Total Protein: 6.7 g/dL (ref 6.5–8.1)

## 2015-09-18 LAB — URINE MICROSCOPIC-ADD ON

## 2015-09-18 LAB — I-STAT TROPONIN, ED: Troponin i, poc: 0.05 ng/mL (ref 0.00–0.08)

## 2015-09-18 LAB — URINALYSIS, ROUTINE W REFLEX MICROSCOPIC
Bilirubin Urine: NEGATIVE
GLUCOSE, UA: 100 mg/dL — AB
KETONES UR: 15 mg/dL — AB
LEUKOCYTES UA: NEGATIVE
NITRITE: NEGATIVE
PH: 5.5 (ref 5.0–8.0)
Protein, ur: >300 mg/dL — AB
Specific Gravity, Urine: 1.023 (ref 1.005–1.030)
Urobilinogen, UA: 1 mg/dL (ref 0.0–1.0)

## 2015-09-18 LAB — BRAIN NATRIURETIC PEPTIDE: B Natriuretic Peptide: 2090.6 pg/mL — ABNORMAL HIGH (ref 0.0–100.0)

## 2015-09-18 MED ORDER — SODIUM CHLORIDE 0.9 % IJ SOLN
3.0000 mL | INTRAMUSCULAR | Status: DC | PRN
  Administered 2015-09-19: 3 mL via INTRAVENOUS
  Filled 2015-09-18: qty 3

## 2015-09-18 MED ORDER — ASPIRIN EC 81 MG PO TBEC
81.0000 mg | DELAYED_RELEASE_TABLET | Freq: Every day | ORAL | Status: DC
  Administered 2015-09-18 – 2015-09-19 (×2): 81 mg via ORAL
  Filled 2015-09-18 (×2): qty 1

## 2015-09-18 MED ORDER — FUROSEMIDE 10 MG/ML IJ SOLN
80.0000 mg | Freq: Two times a day (BID) | INTRAMUSCULAR | Status: DC
Start: 2015-09-18 — End: 2015-09-19
  Administered 2015-09-18 – 2015-09-19 (×2): 80 mg via INTRAVENOUS
  Filled 2015-09-18 (×2): qty 8

## 2015-09-18 MED ORDER — ALBUTEROL SULFATE (2.5 MG/3ML) 0.083% IN NEBU: 2.5000 mg | INHALATION_SOLUTION | RESPIRATORY_TRACT | Status: DC | PRN

## 2015-09-18 MED ORDER — ISOSORBIDE MONONITRATE ER 30 MG PO TB24
15.0000 mg | ORAL_TABLET | Freq: Every day | ORAL | Status: DC
  Administered 2015-09-18 – 2015-09-19 (×2): 15 mg via ORAL
  Filled 2015-09-18 (×3): qty 1

## 2015-09-18 MED ORDER — FUROSEMIDE 10 MG/ML IJ SOLN
40.0000 mg | Freq: Once | INTRAMUSCULAR | Status: AC
  Administered 2015-09-18: 40 mg via INTRAVENOUS
  Filled 2015-09-18: qty 4

## 2015-09-18 MED ORDER — FOLIC ACID 1 MG PO TABS
1.0000 mg | ORAL_TABLET | Freq: Every day | ORAL | Status: DC
  Administered 2015-09-18 – 2015-09-19 (×2): 1 mg via ORAL
  Filled 2015-09-18 (×2): qty 1

## 2015-09-18 MED ORDER — ACETAMINOPHEN 325 MG PO TABS: 650.0000 mg | ORAL_TABLET | ORAL | Status: DC | PRN

## 2015-09-18 MED ORDER — SODIUM CHLORIDE 0.9 % IJ SOLN
3.0000 mL | Freq: Two times a day (BID) | INTRAMUSCULAR | Status: DC
  Administered 2015-09-18: 3 mL via INTRAVENOUS

## 2015-09-18 MED ORDER — VITAMIN B-1 100 MG PO TABS
100.0000 mg | ORAL_TABLET | Freq: Every day | ORAL | Status: DC
  Administered 2015-09-18 – 2015-09-19 (×2): 100 mg via ORAL
  Filled 2015-09-18 (×2): qty 1

## 2015-09-18 MED ORDER — POTASSIUM CHLORIDE CRYS ER 20 MEQ PO TBCR: 40.0000 meq | EXTENDED_RELEASE_TABLET | Freq: Every day | ORAL | Status: DC

## 2015-09-18 MED ORDER — FUROSEMIDE 10 MG/ML IJ SOLN
40.0000 mg | Freq: Once | INTRAMUSCULAR | Status: AC
Start: 2015-09-18 — End: 2015-09-18
  Administered 2015-09-18: 40 mg via INTRAVENOUS
  Filled 2015-09-18: qty 4

## 2015-09-18 MED ORDER — ONDANSETRON HCL 4 MG/2ML IJ SOLN: 4.0000 mg | Freq: Four times a day (QID) | INTRAMUSCULAR | Status: DC | PRN

## 2015-09-18 MED ORDER — ENOXAPARIN SODIUM 40 MG/0.4ML ~~LOC~~ SOLN
40.0000 mg | SUBCUTANEOUS | Status: DC
Start: 2015-09-18 — End: 2015-09-19
  Administered 2015-09-18: 40 mg via SUBCUTANEOUS
  Filled 2015-09-18: qty 0.4

## 2015-09-18 MED ORDER — ALBUTEROL SULFATE (2.5 MG/3ML) 0.083% IN NEBU: 2.5000 mg | INHALATION_SOLUTION | Freq: Four times a day (QID) | RESPIRATORY_TRACT | Status: DC | PRN

## 2015-09-18 MED ORDER — SODIUM CHLORIDE 0.9 % IV SOLN: 250.0000 mL | INTRAVENOUS | Status: DC | PRN

## 2015-09-18 MED ORDER — POTASSIUM CHLORIDE CRYS ER 20 MEQ PO TBCR
40.0000 meq | EXTENDED_RELEASE_TABLET | Freq: Two times a day (BID) | ORAL | Status: AC
  Administered 2015-09-18 – 2015-09-19 (×2): 40 meq via ORAL
  Filled 2015-09-18 (×2): qty 2
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
## 2015-09-18 NOTE — ED Notes (Addendum)
Patient states he is short of breath x2 weeks. Denies chest pain. Patient states he has not taken his "heart failure medications that take fluid off his heart." Noticed abdomen is more distended. Patient states he has been coughing so much he has been throwing up.
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
## 2015-09-19 ENCOUNTER — Encounter (HOSPITAL_COMMUNITY): Payer: Self-pay | Admitting: General Practice

## 2015-09-19 DIAGNOSIS — F1410 Cocaine abuse, uncomplicated: Secondary | ICD-10-CM

## 2015-09-19 DIAGNOSIS — I509 Heart failure, unspecified: Secondary | ICD-10-CM

## 2015-09-19 DIAGNOSIS — I5043 Acute on chronic combined systolic (congestive) and diastolic (congestive) heart failure: Secondary | ICD-10-CM | POA: Diagnosis not present

## 2015-09-19 LAB — BASIC METABOLIC PANEL
ANION GAP: 9 (ref 5–15)
BUN: 13 mg/dL (ref 6–20)
CHLORIDE: 103 mmol/L (ref 101–111)
CO2: 31 mmol/L (ref 22–32)
Calcium: 9 mg/dL (ref 8.9–10.3)
Creatinine, Ser: 1.54 mg/dL — ABNORMAL HIGH (ref 0.61–1.24)
GFR calc Af Amer: 48 mL/min — ABNORMAL LOW (ref >60)
GFR calc non Af Amer: 42 mL/min — ABNORMAL LOW (ref >60)
GLUCOSE: 105 mg/dL — AB (ref 65–99)
POTASSIUM: 3.6 mmol/L (ref 3.5–5.1)
Sodium: 143 mmol/L (ref 135–145)

## 2015-09-19 MED ORDER — INFLUENZA VAC SPLIT QUAD 0.5 ML IM SUSY
0.5000 mL | PREFILLED_SYRINGE | INTRAMUSCULAR | Status: AC
  Administered 2015-09-19: 0.5 mL via INTRAMUSCULAR
  Filled 2015-09-19: qty 0.5

## 2015-09-19 NOTE — Progress Notes (Signed)
Pt given discharge instructions medication list and AVS will discharge ome as ordered. Leeba Barbe, Bettina Gavia RN
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
## 2015-09-19 NOTE — Discharge Summary (Signed)
Physician Discharge Summary  Billy Parker GMW:[id number] DOB: [birth year] DOA: 09/18/2015  PCP: Philis Fendt, MD  Admit date: 09/18/2015 Discharge date: 09/19/2015  Time spent: > 35 minutes  Recommendations for Outpatient Follow-up:  1. Please decide when to continue beta blocker. Patient had positive cocaine on urine drug screen and a such will be held on discharge  Discharge Diagnoses:  Active Problems:   Essential hypertension   H/O Cocaine abuse   Alcohol dependence   Hypokalemia   Acute exacerbation of CHF (congestive heart failure)   Acute CHF   Discharge Condition: Stable  Diet recommendation: Heart healthy, low-sodium diet  Filed Weights   09/18/15 1031 09/18/15 1730 09/19/15 0617  Weight: 71.923 kg (158 lb 9 oz) 69.5 kg (153 lb 3.5 oz) 69.31 kg (152 lb 12.8 oz)    History of present illness:  79 year old African-American male with history of hypertension, coronary artery disease, CHF, who presented to the ED complaining of shortness of breath for a couple of weeks. Chest x-ray reported fluid overload/congestive heart failure with pulmonary venous hypertension, early interstitial edema and small amount of fluid in the fissures.  Hospital Course:  Acute on chronic systolic and diastolic heart failure - We'll hold beta blocker on discharge given positive cocaine on UDS - Patient's condition improved after receiving 80 mg of Lasix IV. He denied any shortness of breath with activity and at rest on day of discharge. - Recommend patient follow-up with his cardiologist or primary care physician for further evaluation recommendations after discharge from hospital  Cocaine abuse - Consulted social worker to provide information on support groups to assist with cessation  Procedures:  None  Consultations:  None  Discharge Exam: Filed Vitals:   09/19/15 1000  BP: 129/84  Pulse: 55  Temp:   Resp:     General: Pt in nad, alert and awake Cardiovascular: rrr, no  mrg Respiratory: cta bl, no wheezes  Discharge Instructions   Discharge Instructions    Call MD for:  difficulty breathing, headache or visual disturbances    Complete by:  As directed      Call MD for:  extreme fatigue    Complete by:  As directed      Call MD for:  temperature >100.4    Complete by:  As directed      Diet - low sodium heart healthy    Complete by:  As directed      Discharge instructions    Complete by:  As directed   Please follow-up with your primary care physician and at that juncture he should decide when to continue your beta blocker     Increase activity slowly    Complete by:  As directed           Current Discharge Medication List    CONTINUE these medications which have NOT CHANGED   Details  albuterol (PROVENTIL HFA;VENTOLIN HFA) 108 (90 BASE) MCG/ACT inhaler Inhale 1-2 puffs into the lungs every 6 (six) hours as needed for wheezing or shortness of breath.    aspirin EC 81 MG EC tablet Take 1 tablet (81 mg total) by mouth daily.    folic acid (FOLVITE) 1 MG tablet Take 1 tablet (1 mg total) by mouth daily. Qty: 30 tablet, Refills: 0    furosemide (LASIX) 80 MG tablet Take 1 tablet (80 mg total) by mouth 2 (two) times daily. Qty: 60 tablet, Refills: 0    isosorbide mononitrate (IMDUR) 30 MG 24 hr tablet Take 0.5 tablets (  15 mg total) by mouth daily. Qty: 30 tablet, Refills: 0    Multiple Vitamin (MULTIVITAMIN WITH MINERALS) TABS tablet Take 1 tablet by mouth daily. Qty: 30 tablet, Refills: 0    potassium chloride SA (K-DUR,KLOR-CON) 20 MEQ tablet Take 2 tablets (40 mEq total) by mouth daily. Qty: 30 tablet, Refills: 0    thiamine 100 MG tablet Take 1 tablet (100 mg total) by mouth daily. Qty: 30 tablet, Refills: 0      STOP taking these medications     carvedilol (COREG) 6.25 MG tablet      cefUROXime (CEFTIN) 500 MG tablet        Allergies  Allergen Reactions  . Other Swelling    antibiotic      The results of significant  diagnostics from this hospitalization (including imaging, microbiology, ancillary and laboratory) are listed below for reference.    Significant Diagnostic Studies: Dg Chest 2 View  09/18/2015   CLINICAL DATA:  Shortness of breath and dizziness, 2 weeks duration. Personal history of hypertension and congestive heart failure.  EXAM: CHEST  2 VIEW  COMPARISON:  05/19/2015  FINDINGS: Heart size is at the upper limits of normal. The aorta is unfolded. There is venous hypertension with early interstitial edema and a small amount of fluid in the fissures. No large effusion. No acute bony finding. Sternal wire in place relating to old traumatic injury.  IMPRESSION: Fluid overload/ congestive heart failure with pulmonary venous hypertension, early interstitial edema and a small amount of fluid in the fissures.   Electronically Signed   By: Nelson Chimes M.D.   On: 09/18/2015 11:14    Microbiology: No results found for this or any previous visit (from the past 240 hour(s)).   Labs: Basic Metabolic Panel:  Recent Labs Lab 09/18/15 1045 09/19/15 0426  NA 139 143  K 3.1* 3.6  CL 106 103  CO2 20* 31  GLUCOSE 170* 105*  BUN 10 13  CREATININE 1.23 1.54*  CALCIUM 9.0 9.0   Liver Function Tests:  Recent Labs Lab 09/18/15 1045  AST 38  ALT 20  ALKPHOS 64  BILITOT 0.6  PROT 6.7  ALBUMIN 3.2*   No results for input(s): LIPASE, AMYLASE in the last 168 hours. No results for input(s): AMMONIA in the last 168 hours. CBC:  Recent Labs Lab 09/18/15 1045  WBC 5.7  NEUTROABS 3.5  HGB 14.8  HCT 43.3  MCV 88.5  PLT 153   Cardiac Enzymes: No results for input(s): CKTOTAL, CKMB, CKMBINDEX, TROPONINI in the last 168 hours. BNP: BNP (last 3 results)  Recent Labs  05/14/15 1119 05/19/15 0809 09/18/15 1045  BNP 2274.3* 3723.9* 2090.6*    ProBNP (last 3 results) No results for input(s): PROBNP in the last 8760 hours.  CBG: No results for input(s): GLUCAP in the last 168  hours.     Signed:  Velvet Bathe  [facility] 09/19/2015, 10:35 AM
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
## 2015-09-19 NOTE — Progress Notes (Signed)
I stopped in to speak with Billy Parker regarding his HF.  He tells me that he has "heard it before".  He was getting dressed and quite anxious to leave.  He says that he has a scale and it is only "room decoration".  I reinforced the importance in daily weights and how they relate to HF sign and symptoms.  I reviewed the need for a low sodium diet as well as high sodium foods to avoid.  He denies any issues with getting or taking prescribed medications.  He tells me that he lives with a "friend" that he "met in treatment".  I encouraged him to call me with any concerns or questions regarding his HF as he was in a big hurry to leave and ended our education.

## 2015-10-09 ENCOUNTER — Encounter (HOSPITAL_COMMUNITY): Payer: Self-pay | Admitting: Emergency Medicine

## 2015-10-09 ENCOUNTER — Emergency Department (HOSPITAL_COMMUNITY)
Admission: EM | Admit: 2015-10-09 | Discharge: 2015-10-09 | Disposition: A | Discharge status: Home / Self Care | Payer: Medicare HMO | Attending: Emergency Medicine | Admitting: Emergency Medicine

## 2015-10-09 DIAGNOSIS — M4690 Unspecified inflammatory spondylopathy, site unspecified: Secondary | ICD-10-CM | POA: Diagnosis not present

## 2015-10-09 DIAGNOSIS — I5021 Acute systolic (congestive) heart failure: Secondary | ICD-10-CM | POA: Insufficient documentation

## 2015-10-09 DIAGNOSIS — R0602 Shortness of breath: Secondary | ICD-10-CM | POA: Diagnosis present

## 2015-10-09 DIAGNOSIS — I10 Essential (primary) hypertension: Secondary | ICD-10-CM

## 2015-10-09 DIAGNOSIS — E876 Hypokalemia: Secondary | ICD-10-CM | POA: Diagnosis not present

## 2015-10-09 DIAGNOSIS — Z760 Encounter for issue of repeat prescription: Secondary | ICD-10-CM | POA: Diagnosis not present

## 2015-10-09 DIAGNOSIS — G8929 Other chronic pain: Secondary | ICD-10-CM | POA: Diagnosis not present

## 2015-10-09 DIAGNOSIS — R05 Cough: Secondary | ICD-10-CM | POA: Diagnosis not present

## 2015-10-09 DIAGNOSIS — Z7982 Long term (current) use of aspirin: Secondary | ICD-10-CM | POA: Diagnosis not present

## 2015-10-09 DIAGNOSIS — I739 Peripheral vascular disease, unspecified: Secondary | ICD-10-CM | POA: Diagnosis not present

## 2015-10-09 DIAGNOSIS — R2240 Localized swelling, mass and lump, unspecified lower limb: Secondary | ICD-10-CM | POA: Insufficient documentation

## 2015-10-09 DIAGNOSIS — R062 Wheezing: Secondary | ICD-10-CM | POA: Insufficient documentation

## 2015-10-09 DIAGNOSIS — I2510 Atherosclerotic heart disease of native coronary artery without angina pectoris: Secondary | ICD-10-CM | POA: Insufficient documentation

## 2015-10-09 DIAGNOSIS — Z79899 Other long term (current) drug therapy: Secondary | ICD-10-CM | POA: Diagnosis not present

## 2015-10-09 DIAGNOSIS — M19021 Primary osteoarthritis, right elbow: Secondary | ICD-10-CM | POA: Insufficient documentation

## 2015-10-09 DIAGNOSIS — I509 Heart failure, unspecified: Secondary | ICD-10-CM

## 2015-10-09 MED ORDER — MULTI-VITAMIN/MINERALS PO TABS: 1.0000 | ORAL_TABLET | Freq: Every day | ORAL | Status: DC

## 2015-10-09 MED ORDER — ASPIRIN EC 81 MG PO TBEC: 81.0000 mg | DELAYED_RELEASE_TABLET | Freq: Every day | ORAL | Status: DC

## 2015-10-09 MED ORDER — FOLIC ACID 1 MG PO TABS: 1.0000 mg | ORAL_TABLET | Freq: Every day | ORAL | Status: DC

## 2015-10-09 MED ORDER — POTASSIUM CHLORIDE CRYS ER 20 MEQ PO TBCR: 40.0000 meq | EXTENDED_RELEASE_TABLET | Freq: Every day | ORAL | Status: DC

## 2015-10-09 MED ORDER — ISOSORBIDE MONONITRATE ER 30 MG PO TB24: 15.0000 mg | ORAL_TABLET | Freq: Every day | ORAL | Status: DC

## 2015-10-09 MED ORDER — FUROSEMIDE 40 MG PO TABS: 80.0000 mg | ORAL_TABLET | Freq: Two times a day (BID) | ORAL | Status: DC

## 2015-10-09 MED ORDER — ALBUTEROL SULFATE HFA 108 (90 BASE) MCG/ACT IN AERS: 2.0000 | INHALATION_SPRAY | Freq: Four times a day (QID) | RESPIRATORY_TRACT | Status: DC | PRN

## 2015-10-09 MED ORDER — VITAMIN B-1 100 MG PO TABS: 100.0000 mg | ORAL_TABLET | Freq: Every day | ORAL | Status: DC

## 2015-10-09 NOTE — ED Notes (Signed)
Pt is in stable condition upon d/c and ambulates from ED. 
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
## 2015-10-09 NOTE — Discharge Instructions (Signed)
Take your medications like you're supposed to, eat a low salt diet, and follow up with your regular doctor to have ongoing management of your chronic medical conditions. Return to the ER for changes or worsening symptoms.   Heart Failure Heart failure means your heart has trouble pumping blood. This makes it hard for your body to work well. Heart failure is usually a long-term (chronic) condition. You must take good care of yourself and follow your doctor's treatment plan. HOME CARE  Take your heart medicine as told by your doctor.  Do not stop taking medicine unless your doctor tells you to.  Do not skip any dose of medicine.  Refill your medicines before they run out.  Take other medicines only as told by your doctor or pharmacist.  Stay active if told by your doctor. The elderly and people with severe heart failure should talk with a doctor about physical activity.  Eat heart-healthy foods. Choose foods that are without trans fat and are low in saturated fat, cholesterol, and salt (sodium). This includes fresh or frozen fruits and vegetables, fish, lean meats, fat-free or low-fat dairy foods, whole grains, and high-fiber foods. Lentils and dried peas and beans (legumes) are also good choices.  Limit salt if told by your doctor.  Cook in a healthy way. Roast, grill, broil, bake, poach, steam, or stir-fry foods.  Limit fluids as told by your doctor.  Weigh yourself every morning. Do this after you pee (urinate) and before you eat breakfast. Write down your weight to give to your doctor.  Take your blood pressure and write it down if your doctor tells you to.  Ask your doctor how to check your pulse. Check your pulse as told.  Lose weight if told by your doctor.  Stop smoking or chewing tobacco. Do not use gum or patches that help you quit without your doctor's approval.  Schedule and go to doctor visits as told.  Nonpregnant women should have no more than 1 drink a day. Men  should have no more than 2 drinks a day. Talk to your doctor about drinking alcohol.  Stop illegal drug use.  Stay current with shots (immunizations).  Manage your health conditions as told by your doctor.  Learn to manage your stress.  Rest when you are tired.  If it is really hot outside:  Avoid intense activities.  Use air conditioning or fans, or get in a cooler place.  Avoid caffeine and alcohol.  Wear loose-fitting, lightweight, and light-colored clothing.  If it is really cold outside:  Avoid intense activities.  Layer your clothing.  Wear mittens or gloves, a hat, and a scarf when going outside.  Avoid alcohol.  Learn about heart failure and get support as needed.  Get help to maintain or improve your quality of life and your ability to care for yourself as needed. GET HELP IF:   You gain weight quickly.  You are more short of breath than usual.  You cannot do your normal activities.  You tire easily.  You cough more than normal, especially with activity.  You have any or more puffiness (swelling) in areas such as your hands, feet, ankles, or belly (abdomen).  You cannot sleep because it is hard to breathe.  You feel like your heart is beating fast (palpitations).  You get dizzy or light-headed when you stand up. GET HELP RIGHT AWAY IF:   You have trouble breathing.  There is a change in mental status, such as becoming  less alert or not being able to focus.  You have chest pain or discomfort.  You faint. MAKE SURE YOU:   Understand these instructions.  Will watch your condition.  Will get help right away if you are not doing well or get worse.   This information is not intended to replace advice given to you by your health care provider. Make sure you discuss any questions you have with your health care provider.   Document Released: 09/25/2008 Document Revised: 01/07/2015 Document Reviewed: 02/02/2013 Elsevier Interactive Patient  Education 2016 [employer].  Hypertension Hypertension, commonly called high blood pressure, is when the force of blood pumping through your arteries is too strong. Your arteries are the blood vessels that carry blood from your heart throughout your body. A blood pressure reading consists of a higher number over a lower number, such as 110/72. The higher number (systolic) is the pressure inside your arteries when your heart pumps. The lower number (diastolic) is the pressure inside your arteries when your heart relaxes. Ideally you want your blood pressure below 120/80. Hypertension forces your heart to work harder to pump blood. Your arteries may become narrow or stiff. Having untreated or uncontrolled hypertension can cause heart attack, stroke, kidney disease, and other problems. RISK FACTORS Some risk factors for high blood pressure are controllable. Others are not.  Risk factors you cannot control include:   Race. You may be at higher risk if you are African American.  Age. Risk increases with age.  Gender. Men are at higher risk than women before age 57 years. After age 62, women are at higher risk than men. Risk factors you can control include:  Not getting enough exercise or physical activity.  Being overweight.  Getting too much fat, sugar, calories, or salt in your diet.  Drinking too much alcohol. SIGNS AND SYMPTOMS Hypertension does not usually cause signs or symptoms. Extremely high blood pressure (hypertensive crisis) may cause headache, anxiety, shortness of breath, and nosebleed. DIAGNOSIS To check if you have hypertension, your health care provider will measure your blood pressure while you are seated, with your arm held at the level of your heart. It should be measured at least twice using the same arm. Certain conditions can cause a difference in blood pressure between your right and left arms. A blood pressure reading that is higher than normal on one occasion does not  mean that you need treatment. If it is not clear whether you have high blood pressure, you may be asked to return on a different day to have your blood pressure checked again. Or, you may be asked to monitor your blood pressure at home for 1 or more weeks. TREATMENT Treating high blood pressure includes making lifestyle changes and possibly taking medicine. Living a healthy lifestyle can help lower high blood pressure. You may need to change some of your habits. Lifestyle changes may include:  Following the DASH diet. This diet is high in fruits, vegetables, and whole grains. It is low in salt, red meat, and added sugars.  Keep your sodium intake below 2,300 mg per day.  Getting at least 30-45 minutes of aerobic exercise at least 4 times per week.  Losing weight if necessary.  Not smoking.  Limiting alcoholic beverages.  Learning ways to reduce stress. Your health care provider may prescribe medicine if lifestyle changes are not enough to get your blood pressure under control, and if one of the following is true:  You are 32-78 years of age  and your systolic blood pressure is above 140.  You are 37 years of age or older, and your systolic blood pressure is above 150.  Your diastolic blood pressure is above 90.  You have diabetes, and your systolic blood pressure is over 086 or your diastolic blood pressure is over 90.  You have kidney disease and your blood pressure is above 140/90.  You have heart disease and your blood pressure is above 140/90. Your personal target blood pressure may vary depending on your medical conditions, your age, and other factors. HOME CARE INSTRUCTIONS  Have your blood pressure rechecked as directed by your health care provider.   Take medicines only as directed by your health care provider. Follow the directions carefully. Blood pressure medicines must be taken as prescribed. The medicine does not work as well when you skip doses. Skipping doses also  puts you at risk for problems.  Do not smoke.   Monitor your blood pressure at home as directed by your health care provider. SEEK MEDICAL CARE IF:   You think you are having a reaction to medicines taken.  You have recurrent headaches or feel dizzy.  You have swelling in your ankles.  You have trouble with your vision. SEEK IMMEDIATE MEDICAL CARE IF:  You develop a severe headache or confusion.  You have unusual weakness, numbness, or feel faint.  You have severe chest or abdominal pain.  You vomit repeatedly.  You have trouble breathing. MAKE SURE YOU:   Understand these instructions.  Will watch your condition.  Will get help right away if you are not doing well or get worse.   This information is not intended to replace advice given to you by your health care provider. Make sure you discuss any questions you have with your health care provider.   Document Released: 12/17/2005 Document Revised: 05/03/2015 Document Reviewed: 10/09/2013 Elsevier Interactive Patient Education 2016 [place] DASH stands for "Dietary Approaches to Stop Hypertension." The DASH eating plan is a healthy eating plan that has been shown to reduce high blood pressure (hypertension). Additional health benefits may include reducing the risk of type 2 diabetes mellitus, heart disease, and stroke. The DASH eating plan may also help with weight loss. WHAT DO I NEED TO KNOW ABOUT THE DASH EATING PLAN? For the DASH eating plan, you will follow these general guidelines:  Choose foods with a percent daily value for sodium of less than 5% (as listed on the food label).  Use salt-free seasonings or herbs instead of table salt or sea salt.  Check with your health care provider or pharmacist before using salt substitutes.  Eat lower-sodium products, often labeled as "lower sodium" or "no salt added."  Eat fresh foods.  Eat more vegetables, fruits, and low-fat dairy  products.  Choose whole grains. Look for the word "whole" as the first word in the ingredient list.  Choose fish and skinless chicken or [place] more often than red meat. Limit fish, poultry, and meat to 6 oz (170 g) each day.  Limit sweets, desserts, sugars, and sugary drinks.  Choose heart-healthy fats.  Limit cheese to 1 oz (28 g) per day.  Eat more home-cooked food and less restaurant, buffet, and fast food.  Limit fried foods.  Cook foods using methods other than frying.  Limit canned vegetables. If you do use them, rinse them well to decrease the sodium.  When eating at a restaurant, ask that your food be prepared with less salt,  or no salt if possible. WHAT FOODS CAN I EAT? Seek help from a dietitian for individual calorie needs. Grains Whole grain or whole wheat bread. Brown rice. Whole grain or whole wheat pasta. Quinoa, bulgur, and whole grain cereals. Low-sodium cereals. Corn or whole wheat flour tortillas. Whole grain cornbread. Whole grain crackers. Low-sodium crackers. Vegetables Fresh or frozen vegetables (raw, steamed, roasted, or grilled). Low-sodium or reduced-sodium tomato and vegetable juices. Low-sodium or reduced-sodium tomato sauce and paste. Low-sodium or reduced-sodium canned vegetables.  Fruits All fresh, canned (in natural juice), or frozen fruits. Meat and Other Protein Products Ground beef (85% or leaner), grass-fed beef, or beef trimmed of fat. Skinless chicken or [place]. Ground chicken or [place]. Pork trimmed of fat. All fish and seafood. Eggs. Dried beans, peas, or lentils. Unsalted nuts and seeds. Unsalted canned beans. Dairy Low-fat dairy products, such as skim or 1% milk, 2% or reduced-fat cheeses, low-fat ricotta or cottage cheese, or plain low-fat yogurt. Low-sodium or reduced-sodium cheeses. Fats and Oils Tub margarines without trans fats. Light or reduced-fat mayonnaise and salad dressings (reduced sodium). Avocado. Safflower, olive, or canola  oils. Natural peanut or almond butter. Other Unsalted popcorn and pretzels. The items listed above may not be a complete list of recommended foods or beverages. Contact your dietitian for more options. WHAT FOODS ARE NOT RECOMMENDED? Grains White bread. White pasta. White rice. Refined cornbread. Bagels and croissants. Crackers that contain trans fat. Vegetables Creamed or fried vegetables. Vegetables in a cheese sauce. Regular canned vegetables. Regular canned tomato sauce and paste. Regular tomato and vegetable juices. Fruits Dried fruits. Canned fruit in light or heavy syrup. Fruit juice. Meat and Other Protein Products Fatty cuts of meat. Ribs, chicken wings, bacon, sausage, bologna, salami, chitterlings, fatback, hot dogs, bratwurst, and packaged luncheon meats. Salted nuts and seeds. Canned beans with salt. Dairy Whole or 2% milk, cream, half-and-half, and cream cheese. Whole-fat or sweetened yogurt. Full-fat cheeses or blue cheese. Nondairy creamers and whipped toppings. Processed cheese, cheese spreads, or cheese curds. Condiments Onion and garlic salt, seasoned salt, table salt, and sea salt. Canned and packaged gravies. Worcestershire sauce. Tartar sauce. Barbecue sauce. Teriyaki sauce. Soy sauce, including reduced sodium. Steak sauce. Fish sauce. Oyster sauce. Cocktail sauce. Horseradish. Ketchup and mustard. Meat flavorings and tenderizers. Bouillon cubes. Hot sauce. Tabasco sauce. Marinades. Taco seasonings. Relishes. Fats and Oils Butter, stick margarine, lard, shortening, ghee, and bacon fat. Coconut, palm kernel, or palm oils. Regular salad dressings. Other Pickles and olives. Salted popcorn and pretzels. The items listed above may not be a complete list of foods and beverages to avoid. Contact your dietitian for more information. WHERE CAN I FIND MORE INFORMATION? National Heart, Lung, and Blood Institute: travelstabloid.com   This  information is not intended to replace advice given to you by your health care provider. Make sure you discuss any questions you have with your health care provider.   Document Released: 12/06/2011 Document Revised: 01/07/2015 Document Reviewed: 10/21/2013 Elsevier Interactive Patient Education 2016 [place] Your High Blood Pressure Blood pressure is a measurement of how forceful your blood is pressing against the walls of the arteries. Arteries are muscular tubes within the circulatory system. Blood pressure does not stay the same. Blood pressure rises when you are active, excited, or nervous; and it lowers during sleep and relaxation. If the numbers measuring your blood pressure stay above normal most of the time, you are at risk for health problems. High blood pressure (hypertension) is a long-term (chronic)  condition in which blood pressure is elevated. A blood pressure reading is recorded as two numbers, such as 120 over 80 (or 120/80). The first, higher number is called the systolic pressure. It is a measure of the pressure in your arteries as the heart beats. The second, lower number is called the diastolic pressure. It is a measure of the pressure in your arteries as the heart relaxes between beats.  Keeping your blood pressure in a normal range is important to your overall health and prevention of health problems, such as heart disease and stroke. When your blood pressure is uncontrolled, your heart has to work harder than normal. High blood pressure is a very common condition in adults because blood pressure tends to rise with age. Men and women are equally likely to have hypertension but at different times in life. Before age 55, men are more likely to have hypertension. After 77 years of age, women are more likely to have it. Hypertension is especially common in African Americans. This condition often has no signs or symptoms. The cause of the condition is usually not known. Your  caregiver can help you come up with a plan to keep your blood pressure in a normal, healthy range. BLOOD PRESSURE STAGES Blood pressure is classified into four stages: normal, prehypertension, stage 1, and stage 2. Your blood pressure reading will be used to determine what type of treatment, if any, is necessary. Appropriate treatment options are tied to these four stages:  Normal  Systolic pressure (mm Hg): below 120.  Diastolic pressure (mm Hg): below 80. Prehypertension  Systolic pressure (mm Hg): 120 to 139.  Diastolic pressure (mm Hg): 80 to 89. Stage1  Systolic pressure (mm Hg): 140 to 159.  Diastolic pressure (mm Hg): 90 to 99. Stage2  Systolic pressure (mm Hg): 160 or above.  Diastolic pressure (mm Hg): 100 or above. RISKS RELATED TO HIGH BLOOD PRESSURE Managing your blood pressure is an important responsibility. Uncontrolled high blood pressure can lead to:  A heart attack.  A stroke.  A weakened blood vessel (aneurysm).  Heart failure.  Kidney damage.  Eye damage.  Metabolic syndrome.  Memory and concentration problems. HOW TO MANAGE YOUR BLOOD PRESSURE Blood pressure can be managed effectively with lifestyle changes and medicines (if needed). Your caregiver will help you come up with a plan to bring your blood pressure within a normal range. Your plan should include the following: Education  Read all information provided by your caregivers about how to control blood pressure.  Educate yourself on the latest guidelines and treatment recommendations. New research is always being done to further define the risks and treatments for high blood pressure. Lifestylechanges  Control your weight.  Avoid smoking.  Stay physically active.  Reduce the amount of salt in your diet.  Reduce stress.  Control any chronic conditions, such as high cholesterol or diabetes.  Reduce your alcohol intake. Medicines  Several medicines (antihypertensive medicines)  are available, if needed, to bring blood pressure within a normal range. Communication  Review all the medicines you take with your caregiver because there may be side effects or interactions.  Talk with your caregiver about your diet, exercise habits, and other lifestyle factors that may be contributing to high blood pressure.  See your caregiver regularly. Your caregiver can help you create and adjust your plan for managing high blood pressure. RECOMMENDATIONS FOR TREATMENT AND FOLLOW-UP  The following recommendations are based on current guidelines for managing high blood pressure in nonpregnant adults.  Use these recommendations to identify the proper follow-up period or treatment option based on your blood pressure reading. You can discuss these options with your caregiver.  Systolic pressure of 973 to 532 or diastolic pressure of 80 to 89: Follow up with your caregiver as directed.  Systolic pressure of 992 to 426 or diastolic pressure of 90 to 100: Follow up with your caregiver within 2 months.  Systolic pressure above 834 or diastolic pressure above 196: Follow up with your caregiver within 1 month.  Systolic pressure above 222 or diastolic pressure above 979: Consider antihypertensive therapy; follow up with your caregiver within 1 week.  Systolic pressure above 892 or diastolic pressure above 119: Begin antihypertensive therapy; follow up with your caregiver within 1 week.   This information is not intended to replace advice given to you by your health care provider. Make sure you discuss any questions you have with your health care provider.   Document Released: 09/10/2012 Document Reviewed: 09/10/2012 Elsevier Interactive Patient Education 2016 [place] of Breath Shortness of breath means you have trouble breathing. Shortness of breath needs medical care right away. HOME CARE   Do not smoke.  Avoid being around chemicals or things (paint fumes, dust) that  may bother your breathing.  Rest as needed. Slowly begin your normal activities.  Only take medicines as told by your doctor.  Keep all doctor visits as told. GET HELP RIGHT AWAY IF:   Your shortness of breath gets worse.  You feel lightheaded, pass out (faint), or have a cough that is not helped by medicine.  You cough up blood.  You have pain with breathing.  You have pain in your chest, arms, shoulders, or belly (abdomen).  You have a fever.  You cannot walk up stairs or exercise the way you normally do.  You do not get better in the time expected.  You have a hard time doing normal activities even with rest.  You have problems with your medicines.  You have any new symptoms. MAKE SURE YOU:  Understand these instructions.  Will watch your condition.  Will get help right away if you are not doing well or get worse.   This information is not intended to replace advice given to you by your health care provider. Make sure you discuss any questions you have with your health care provider.   Document Released: 06/04/2008 Document Revised: 12/22/2013 Document Reviewed: 03/03/2012 Elsevier Interactive Patient Education 2016 [place] Refill at the Emergency Department We have refilled your medicine today, but it is best for you to get refills through your primary health care provider's office. In the future, please plan ahead so you do not need to get refills from the emergency department. If the medicine we refilled was a maintenance medicine, you may have received only enough to get you by until you are able to see your regular health care provider.   This information is not intended to replace advice given to you by your health care provider. Make sure you discuss any questions you have with your health care provider.   Document Released: 04/04/2004 Document Revised: 01/07/2015 Document Reviewed: 03/26/2014 Elsevier Interactive Patient Education [employer]

## 2015-10-09 NOTE — ED Provider Notes (Signed)
Medical screening examination/treatment/procedure(s) were conducted as a shared visit with non-physician practitioner(s) and myself.  I personally evaluated the patient during the encounter.  79 year old male with a history of CHF here for medication refill. He states he has shortness of breath however on my history taking he states this is chronic and unchanged. He doesn't have any new swelling in his legs or his abdomen. Exam he has no crackles, significant tachypnea or hypoxia to suggest acute exacerbation of heart failure. Does not claim any chest pain. Last used cocaine a few days ago I advised that quitting cocaine would help his heart he understood but does not plan to quit. Since secondary to the nonacute nature of his complaints we will refill is pertinent medications and encourage PCP follow-up. I discussed than this is not similar generally do but in this instance we will comply.    EKG Interpretation   Date/Time:  Sunday October 09 2015 10:12:34 EDT Ventricular Rate:  97 PR Interval:  128 QRS Duration: 90 QT Interval:  394 QTC Calculation: 500 R Axis:   83 Text Interpretation:  Undetermined rhythm Left ventricular hypertrophy  with repolarization abnormality Prolonged QT Abnormal ECG similar  underlying rhythm to september 19, but with frequent PVC's Confirmed by  [facility] MD, Misha Antonini [phone]) on 10/09/2015 10:27:56 AM        Merrily Pew, MD 10/09/15 1055

## 2015-10-09 NOTE — ED Notes (Signed)
Pt. Stated, I ve been out of medication for a month and so I've had all the problems from not having my medication.  I've had chest pain and SOB for a month.

## 2016-04-10 ENCOUNTER — Encounter: Payer: Self-pay | Admitting: *Deleted

## 2016-04-10 ENCOUNTER — Encounter: Payer: Medicare HMO | Admitting: Cardiology

## 2016-04-10 NOTE — Progress Notes (Signed)
This encounter was created in error - please disregard.
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
## 2016-05-23 ENCOUNTER — Emergency Department (HOSPITAL_COMMUNITY)
Admission: EM | Admit: 2016-05-23 | Discharge: 2016-05-23 | Disposition: A | Discharge status: Home / Self Care | Payer: Medicare HMO | Attending: Emergency Medicine | Admitting: Emergency Medicine

## 2016-05-23 ENCOUNTER — Emergency Department (HOSPITAL_COMMUNITY): Payer: Medicare HMO

## 2016-05-23 ENCOUNTER — Encounter (HOSPITAL_COMMUNITY): Payer: Self-pay | Admitting: *Deleted

## 2016-05-23 DIAGNOSIS — Z79899 Other long term (current) drug therapy: Secondary | ICD-10-CM | POA: Diagnosis not present

## 2016-05-23 DIAGNOSIS — I110 Hypertensive heart disease with heart failure: Secondary | ICD-10-CM | POA: Diagnosis not present

## 2016-05-23 DIAGNOSIS — I255 Ischemic cardiomyopathy: Secondary | ICD-10-CM | POA: Diagnosis not present

## 2016-05-23 DIAGNOSIS — I5023 Acute on chronic systolic (congestive) heart failure: Secondary | ICD-10-CM | POA: Diagnosis not present

## 2016-05-23 DIAGNOSIS — I509 Heart failure, unspecified: Secondary | ICD-10-CM | POA: Diagnosis present

## 2016-05-23 DIAGNOSIS — Z7982 Long term (current) use of aspirin: Secondary | ICD-10-CM | POA: Insufficient documentation

## 2016-05-23 DIAGNOSIS — I2510 Atherosclerotic heart disease of native coronary artery without angina pectoris: Secondary | ICD-10-CM | POA: Diagnosis not present

## 2016-05-23 LAB — CBC WITH DIFFERENTIAL/PLATELET
BASOS PCT: 0 %
Basophils Absolute: 0 10*3/uL (ref 0.0–0.1)
EOS ABS: 0.1 10*3/uL (ref 0.0–0.7)
Eosinophils Relative: 1 %
HCT: 44.7 % (ref 39.0–52.0)
HEMOGLOBIN: 15.2 g/dL (ref 13.0–17.0)
Lymphocytes Relative: 37 %
Lymphs Abs: 2.1 10*3/uL (ref 0.7–4.0)
MCH: 29.7 pg (ref 26.0–34.0)
MCHC: 34 g/dL (ref 30.0–36.0)
MCV: 87.5 fL (ref 78.0–100.0)
MONOS PCT: 8 %
Monocytes Absolute: 0.4 10*3/uL (ref 0.1–1.0)
NEUTROS PCT: 54 %
Neutro Abs: 3.1 10*3/uL (ref 1.7–7.7)
Platelets: 161 10*3/uL (ref 150–400)
RBC: 5.11 MIL/uL (ref 4.22–5.81)
RDW: 13.5 % (ref 11.5–15.5)
WBC: 5.7 10*3/uL (ref 4.0–10.5)

## 2016-05-23 LAB — COMPREHENSIVE METABOLIC PANEL
ALBUMIN: 3.2 g/dL — AB (ref 3.5–5.0)
ALK PHOS: 78 U/L (ref 38–126)
ALT: 25 U/L (ref 17–63)
ANION GAP: 8 (ref 5–15)
AST: 43 U/L — ABNORMAL HIGH (ref 15–41)
BUN: 11 mg/dL (ref 6–20)
CALCIUM: 9.1 mg/dL (ref 8.9–10.3)
CO2: 24 mmol/L (ref 22–32)
CREATININE: 1.05 mg/dL (ref 0.61–1.24)
Chloride: 105 mmol/L (ref 101–111)
GFR calc Af Amer: >60 mL/min (ref >60)
GFR calc non Af Amer: >60 mL/min (ref >60)
Glucose, Bld: 87 mg/dL (ref 65–99)
Potassium: 3.3 mmol/L — ABNORMAL LOW (ref 3.5–5.1)
SODIUM: 137 mmol/L (ref 135–145)
TOTAL PROTEIN: 7.2 g/dL (ref 6.5–8.1)
Total Bilirubin: 0.5 mg/dL (ref 0.3–1.2)

## 2016-05-23 LAB — ETHANOL

## 2016-05-23 LAB — BRAIN NATRIURETIC PEPTIDE: B NATRIURETIC PEPTIDE 5: 276.9 pg/mL — AB (ref 0.0–100.0)

## 2016-05-23 LAB — TROPONIN I: TROPONIN I: 0.04 ng/mL — AB (ref <0.031)

## 2016-05-23 MED ORDER — FUROSEMIDE 10 MG/ML IJ SOLN
80.0000 mg | Freq: Once | INTRAMUSCULAR | Status: AC
  Administered 2016-05-23: 80 mg via INTRAVENOUS
  Filled 2016-05-23: qty 8

## 2016-05-23 MED ORDER — FUROSEMIDE 40 MG PO TABS: 40.0000 mg | ORAL_TABLET | Freq: Two times a day (BID) | ORAL | Status: DC

## 2016-05-23 MED ORDER — POTASSIUM CHLORIDE CRYS ER 20 MEQ PO TBCR
40.0000 meq | EXTENDED_RELEASE_TABLET | Freq: Once | ORAL | Status: AC
  Administered 2016-05-23: 40 meq via ORAL
  Filled 2016-05-23: qty 2

## 2016-05-23 NOTE — Discharge Instructions (Signed)
The evaluated you for your shortness of breath and lightheadedness. We treated you for very mild heart failure exacerbation. We checked her heart and it does not appear that you're having a heart attack. Please increase your Lasix to 1 pill twice per day. Please follow-up with your cardiologist, Dr. Ellyn Hack. If your symptoms worsen, please return immediately for reevaluation.   Heart Failure Heart failure is a condition in which the heart has trouble pumping blood. This means your heart does not pump blood efficiently for your body to work well. In some cases of heart failure, fluid may back up into your lungs or you may have swelling (edema) in your lower legs. Heart failure is usually a long-term (chronic) condition. It is important for you to take good care of yourself and follow your health care provider's treatment plan. CAUSES  Some health conditions can cause heart failure. Those health conditions include:  High blood pressure (hypertension). Hypertension causes the heart muscle to work harder than normal. When pressure in the blood vessels is high, the heart needs to pump (contract) with more force in order to circulate blood throughout the body. High blood pressure eventually causes the heart to become stiff and weak.  Coronary artery disease (CAD). CAD is the buildup of cholesterol and fat (plaque) in the arteries of the heart. The blockage in the arteries deprives the heart muscle of oxygen and blood. This can cause chest pain and may lead to a heart attack. High blood pressure can also contribute to CAD.  Heart attack (myocardial infarction). A heart attack occurs when one or more arteries in the heart become blocked. The loss of oxygen damages the muscle tissue of the heart. When this happens, part of the heart muscle dies. The injured tissue does not contract as well and weakens the heart's ability to pump blood.  Abnormal heart valves. When the heart valves do not open and close  properly, it can cause heart failure. This makes the heart muscle pump harder to keep the blood flowing.  Heart muscle disease (cardiomyopathy or myocarditis). Heart muscle disease is damage to the heart muscle from a variety of causes. These can include drug or alcohol abuse, infections, or unknown reasons. These can increase the risk of heart failure.  Lung disease. Lung disease makes the heart work harder because the lungs do not work properly. This can cause a strain on the heart, leading it to fail.  Diabetes. Diabetes increases the risk of heart failure. High blood sugar contributes to high fat (lipid) levels in the blood. Diabetes can also cause slow damage to tiny blood vessels that carry important nutrients to the heart muscle. When the heart does not get enough oxygen and food, it can cause the heart to become weak and stiff. This leads to a heart that does not contract efficiently.  Other conditions can contribute to heart failure. These include abnormal heart rhythms, thyroid problems, and low blood counts (anemia). Certain unhealthy behaviors can increase the risk of heart failure, including:  Being overweight.  Smoking or chewing tobacco.  Eating foods high in fat and cholesterol.  Abusing illicit drugs or alcohol.  Lacking physical activity. SYMPTOMS  Heart failure symptoms may vary and can be hard to detect. Symptoms may include:  Shortness of breath with activity, such as climbing stairs.  Persistent cough.  Swelling of the feet, ankles, legs, or abdomen.  Unexplained weight gain.  Difficulty breathing when lying flat (orthopnea).  Waking from sleep because of the need to  sit up and get more air.  Rapid heartbeat.  Fatigue and loss of energy.  Feeling light-headed, dizzy, or close to fainting.  Loss of appetite.  Nausea.  Increased urination during the night (nocturia). DIAGNOSIS  A diagnosis of heart failure is based on your history, symptoms, physical  examination, and diagnostic tests. Diagnostic tests for heart failure may include:  Echocardiography.  Electrocardiography.  Chest X-ray.  Blood tests.  Exercise stress test.  Cardiac angiography.  Radionuclide scans. TREATMENT  Treatment is aimed at managing the symptoms of heart failure. Medicines, behavioral changes, or surgical intervention may be necessary to treat heart failure.  Medicines to help treat heart failure may include:  Angiotensin-converting enzyme (ACE) inhibitors. This type of medicine blocks the effects of a blood protein called angiotensin-converting enzyme. ACE inhibitors relax (dilate) the blood vessels and help lower blood pressure.  Angiotensin receptor blockers (ARBs). This type of medicine blocks the actions of a blood protein called angiotensin. Angiotensin receptor blockers dilate the blood vessels and help lower blood pressure.  Water pills (diuretics). Diuretics cause the kidneys to remove salt and water from the blood. The extra fluid is removed through urination. This loss of extra fluid lowers the volume of blood the heart pumps.  Beta blockers. These prevent the heart from beating too fast and improve heart muscle strength.  Digitalis. This increases the force of the heartbeat.  Healthy behavior changes include:  Obtaining and maintaining a healthy weight.  Stopping smoking or chewing tobacco.  Eating heart-healthy foods.  Limiting or avoiding alcohol.  Stopping illicit drug use.  Physical activity as directed by your health care provider.  Surgical treatment for heart failure may include:  A procedure to open blocked arteries, repair damaged heart valves, or remove damaged heart muscle tissue.  A pacemaker to improve heart muscle function and control certain abnormal heart rhythms.  An internal cardioverter defibrillator to treat certain serious abnormal heart rhythms.  A left ventricular assist device (LVAD) to assist the  pumping ability of the heart. HOME CARE INSTRUCTIONS   Take medicines only as directed by your health care provider. Medicines are important in reducing the workload of your heart, slowing the progression of heart failure, and improving your symptoms.  Do not stop taking your medicine unless directed by your health care provider.  Do not skip any dose of medicine.  Refill your prescriptions before you run out of medicine. Your medicines are needed every day.  Engage in moderate physical activity if directed by your health care provider. Moderate physical activity can benefit some people. The elderly and people with severe heart failure should consult with a health care provider for physical activity recommendations.  Eat heart-healthy foods. Food choices should be free of trans fat and low in saturated fat, cholesterol, and salt (sodium). Healthy choices include fresh or frozen fruits and vegetables, fish, lean meats, legumes, fat-free or low-fat dairy products, and whole grain or high fiber foods. Talk to a dietitian to learn more about heart-healthy foods.  Limit sodium if directed by your health care provider. Sodium restriction may reduce symptoms of heart failure in some people. Talk to a dietitian to learn more about heart-healthy seasonings.  Use healthy cooking methods. Healthy cooking methods include roasting, grilling, broiling, baking, poaching, steaming, or stir-frying. Talk to a dietitian to learn more about healthy cooking methods.  Limit fluids if directed by your health care provider. Fluid restriction may reduce symptoms of heart failure in some people.  Weigh yourself every day.  Daily weights are important in the early recognition of excess fluid. You should weigh yourself every morning after you urinate and before you eat breakfast. Wear the same amount of clothing each time you weigh yourself. Record your daily weight. Provide your health care provider with your weight  record.  Monitor and record your blood pressure if directed by your health care provider.  Check your pulse if directed by your health care provider.  Lose weight if directed by your health care provider. Weight loss may reduce symptoms of heart failure in some people.  Stop smoking or chewing tobacco. Nicotine makes your heart work harder by causing your blood vessels to constrict. Do not use nicotine gum or patches before talking to your health care provider.  Keep all follow-up visits as directed by your health care provider. This is important.  Limit alcohol intake to no more than 1 drink per day for nonpregnant women and 2 drinks per day for men. One drink equals 12 ounces of beer, 5 ounces of wine, or 1 ounces of hard liquor. Drinking more than that is harmful to your heart. Tell your health care provider if you drink alcohol several times a week. Talk with your health care provider about whether alcohol is safe for you. If your heart has already been damaged by alcohol or you have severe heart failure, drinking alcohol should be stopped completely.  Stop illicit drug use.  Stay up-to-date with immunizations. It is especially important to prevent respiratory infections through current pneumococcal and influenza immunizations.  Manage other health conditions such as hypertension, diabetes, thyroid disease, or abnormal heart rhythms as directed by your health care provider.  Learn to manage stress.  Plan rest periods when fatigued.  Learn strategies to manage high temperatures. If the weather is extremely hot:  Avoid vigorous physical activity.  Use air conditioning or fans or seek a cooler location.  Avoid caffeine and alcohol.  Wear loose-fitting, lightweight, and light-colored clothing.  Learn strategies to manage cold temperatures. If the weather is extremely cold:  Avoid vigorous physical activity.  Layer clothes.  Wear mittens or gloves, a hat, and a scarf when  going outside.  Avoid alcohol.  Obtain ongoing education and support as needed.  Participate in or seek rehabilitation as needed to maintain or improve independence and quality of life. SEEK MEDICAL CARE IF:   You have a rapid weight gain.  You have increasing shortness of breath that is unusual for you.  You are unable to participate in your usual physical activities.  You tire easily.  You cough more than normal, especially with physical activity.  You have any or more swelling in areas such as your hands, feet, ankles, or abdomen.  You are unable to sleep because it is hard to breathe.  You feel like your heart is beating fast (palpitations).  You become dizzy or light-headed upon standing up. SEEK IMMEDIATE MEDICAL CARE IF:   You have difficulty breathing.  There is a change in mental status such as decreased alertness or difficulty with concentration.  You have a pain or discomfort in your chest.  You have an episode of fainting (syncope). MAKE SURE YOU:   Understand these instructions.  Will watch your condition.  Will get help right away if you are not doing well or get worse.   This information is not intended to replace advice given to you by your health care provider. Make sure you discuss any questions you have with your health care  provider.   Document Released: 12/17/2005 Document Revised: 05/03/2015 Document Reviewed: 01/16/2013 Elsevier Interactive Patient Education [employer].

## 2016-05-23 NOTE — ED Notes (Signed)
BP 168/90 per EMS ,lungs clear. Pt last went to PCP on 05-14-16. Pt reports of his home meds he is only taking Furosemide 40mg . Pt reports he took 80mg  of furosemide tgo day on his on advice . Pt did not call his PCP. Pt reports felt dizzy with second pill.
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
## 2016-08-15 ENCOUNTER — Emergency Department (HOSPITAL_COMMUNITY): Payer: Medicare HMO

## 2016-08-15 ENCOUNTER — Encounter (HOSPITAL_COMMUNITY): Payer: Self-pay | Admitting: *Deleted

## 2016-08-15 ENCOUNTER — Emergency Department (HOSPITAL_COMMUNITY)
Admission: EM | Admit: 2016-08-15 | Discharge: 2016-08-15 | Disposition: A | Discharge status: Home / Self Care | Payer: Medicare HMO | Attending: Emergency Medicine | Admitting: Emergency Medicine

## 2016-08-15 DIAGNOSIS — M25551 Pain in right hip: Secondary | ICD-10-CM | POA: Insufficient documentation

## 2016-08-15 DIAGNOSIS — I5043 Acute on chronic combined systolic (congestive) and diastolic (congestive) heart failure: Secondary | ICD-10-CM | POA: Insufficient documentation

## 2016-08-15 DIAGNOSIS — N183 Chronic kidney disease, stage 3 (moderate): Secondary | ICD-10-CM | POA: Diagnosis not present

## 2016-08-15 DIAGNOSIS — I2510 Atherosclerotic heart disease of native coronary artery without angina pectoris: Secondary | ICD-10-CM | POA: Insufficient documentation

## 2016-08-15 DIAGNOSIS — R600 Localized edema: Secondary | ICD-10-CM | POA: Diagnosis not present

## 2016-08-15 DIAGNOSIS — Z7982 Long term (current) use of aspirin: Secondary | ICD-10-CM | POA: Insufficient documentation

## 2016-08-15 DIAGNOSIS — I130 Hypertensive heart and chronic kidney disease with heart failure and stage 1 through stage 4 chronic kidney disease, or unspecified chronic kidney disease: Secondary | ICD-10-CM | POA: Diagnosis not present

## 2016-08-15 DIAGNOSIS — M25559 Pain in unspecified hip: Secondary | ICD-10-CM

## 2016-08-15 LAB — BASIC METABOLIC PANEL
Anion gap: 11 (ref 5–15)
BUN: 15 mg/dL (ref 6–20)
CALCIUM: 9.2 mg/dL (ref 8.9–10.3)
CHLORIDE: 100 mmol/L — AB (ref 101–111)
CO2: 26 mmol/L (ref 22–32)
CREATININE: 1.35 mg/dL — AB (ref 0.61–1.24)
GFR calc Af Amer: 56 mL/min — ABNORMAL LOW (ref >60)
GFR calc non Af Amer: 49 mL/min — ABNORMAL LOW (ref >60)
GLUCOSE: 130 mg/dL — AB (ref 65–99)
Potassium: 3.4 mmol/L — ABNORMAL LOW (ref 3.5–5.1)
Sodium: 137 mmol/L (ref 135–145)

## 2016-08-15 LAB — CBC
HCT: 46.5 % (ref 39.0–52.0)
Hemoglobin: 15.6 g/dL (ref 13.0–17.0)
MCH: 29.7 pg (ref 26.0–34.0)
MCHC: 33.5 g/dL (ref 30.0–36.0)
MCV: 88.4 fL (ref 78.0–100.0)
PLATELETS: 210 10*3/uL (ref 150–400)
RBC: 5.26 MIL/uL (ref 4.22–5.81)
RDW: 13.6 % (ref 11.5–15.5)
WBC: 7.1 10*3/uL (ref 4.0–10.5)

## 2016-08-15 LAB — I-STAT TROPONIN, ED: TROPONIN I, POC: 0.01 ng/mL (ref 0.00–0.08)

## 2016-08-15 MED ORDER — OXYCODONE-ACETAMINOPHEN 5-325 MG PO TABS: 1.0000 | ORAL_TABLET | Freq: Three times a day (TID) | ORAL | 0 refills | Status: DC | PRN
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
## 2016-08-15 NOTE — ED Triage Notes (Signed)
Pt reports swelling to legs x 2 months. Now has pain to right side. Has sob when lying down at night. Reports hx of chf.

## 2016-08-15 NOTE — Discharge Instructions (Signed)
Please increase your water pills (Lasix) to 80mg  per day, rather than 40 mg per day. It is also important to schedule an appointment with your cardiologist, Dr. Ellyn Hack, as soon as possible.   For your side pain, you can take the pain medicine we have prescribed.

## 2016-12-10 ENCOUNTER — Ambulatory Visit (INDEPENDENT_AMBULATORY_CARE_PROVIDER_SITE_OTHER): Payer: Self-pay | Admitting: Orthopaedic Surgery

## 2017-04-30 HISTORY — PX: OTHER SURGICAL HISTORY: SHX169

## 2017-05-10 ENCOUNTER — Encounter (HOSPITAL_COMMUNITY): Payer: Self-pay

## 2017-05-10 ENCOUNTER — Observation Stay (HOSPITAL_COMMUNITY): Payer: Medicare Other

## 2017-05-10 ENCOUNTER — Emergency Department (HOSPITAL_COMMUNITY): Payer: Medicare Other

## 2017-05-10 ENCOUNTER — Observation Stay (HOSPITAL_COMMUNITY)
Admission: EM | Admit: 2017-05-10 | Discharge: 2017-05-14 | Disposition: A | Discharge status: Home / Self Care | Payer: Medicare Other | Attending: Family Medicine | Admitting: Family Medicine

## 2017-05-10 DIAGNOSIS — E876 Hypokalemia: Secondary | ICD-10-CM | POA: Diagnosis not present

## 2017-05-10 DIAGNOSIS — I340 Nonrheumatic mitral (valve) insufficiency: Secondary | ICD-10-CM | POA: Diagnosis present

## 2017-05-10 DIAGNOSIS — I428 Other cardiomyopathies: Secondary | ICD-10-CM | POA: Diagnosis not present

## 2017-05-10 DIAGNOSIS — R55 Syncope and collapse: Secondary | ICD-10-CM | POA: Diagnosis not present

## 2017-05-10 DIAGNOSIS — N183 Chronic kidney disease, stage 3 (moderate): Secondary | ICD-10-CM | POA: Insufficient documentation

## 2017-05-10 DIAGNOSIS — I472 Ventricular tachycardia: Secondary | ICD-10-CM | POA: Insufficient documentation

## 2017-05-10 DIAGNOSIS — F1020 Alcohol dependence, uncomplicated: Secondary | ICD-10-CM | POA: Diagnosis present

## 2017-05-10 DIAGNOSIS — I2510 Atherosclerotic heart disease of native coronary artery without angina pectoris: Secondary | ICD-10-CM | POA: Insufficient documentation

## 2017-05-10 DIAGNOSIS — Z7982 Long term (current) use of aspirin: Secondary | ICD-10-CM | POA: Diagnosis not present

## 2017-05-10 DIAGNOSIS — R0602 Shortness of breath: Secondary | ICD-10-CM

## 2017-05-10 DIAGNOSIS — I130 Hypertensive heart and chronic kidney disease with heart failure and stage 1 through stage 4 chronic kidney disease, or unspecified chronic kidney disease: Secondary | ICD-10-CM | POA: Diagnosis not present

## 2017-05-10 DIAGNOSIS — Z79899 Other long term (current) drug therapy: Secondary | ICD-10-CM | POA: Insufficient documentation

## 2017-05-10 DIAGNOSIS — Z8249 Family history of ischemic heart disease and other diseases of the circulatory system: Secondary | ICD-10-CM | POA: Insufficient documentation

## 2017-05-10 DIAGNOSIS — F1410 Cocaine abuse, uncomplicated: Secondary | ICD-10-CM | POA: Diagnosis present

## 2017-05-10 DIAGNOSIS — Z9119 Patient's noncompliance with other medical treatment and regimen: Secondary | ICD-10-CM | POA: Insufficient documentation

## 2017-05-10 DIAGNOSIS — I5032 Chronic diastolic (congestive) heart failure: Secondary | ICD-10-CM | POA: Insufficient documentation

## 2017-05-10 DIAGNOSIS — R188 Other ascites: Secondary | ICD-10-CM

## 2017-05-10 DIAGNOSIS — I5189 Other ill-defined heart diseases: Secondary | ICD-10-CM | POA: Diagnosis present

## 2017-05-10 DIAGNOSIS — N182 Chronic kidney disease, stage 2 (mild): Secondary | ICD-10-CM | POA: Diagnosis present

## 2017-05-10 DIAGNOSIS — I10 Essential (primary) hypertension: Secondary | ICD-10-CM | POA: Diagnosis present

## 2017-05-10 HISTORY — DX: Ventricular premature depolarization: I49.3

## 2017-05-10 HISTORY — DX: Other cardiomyopathies: I42.8

## 2017-05-10 HISTORY — DX: Alcohol abuse, uncomplicated: F10.10

## 2017-05-10 LAB — CBC WITH DIFFERENTIAL/PLATELET
BASOS ABS: 0 10*3/uL (ref 0.0–0.1)
BASOS PCT: 0 %
Eosinophils Absolute: 0.1 10*3/uL (ref 0.0–0.7)
Eosinophils Relative: 1 %
HCT: 47.4 % (ref 39.0–52.0)
HEMOGLOBIN: 16.2 g/dL (ref 13.0–17.0)
Lymphocytes Relative: 41 %
Lymphs Abs: 2.3 10*3/uL (ref 0.7–4.0)
MCH: 29.5 pg (ref 26.0–34.0)
MCHC: 34.2 g/dL (ref 30.0–36.0)
MCV: 86.3 fL (ref 78.0–100.0)
Monocytes Absolute: 0.4 10*3/uL (ref 0.1–1.0)
Monocytes Relative: 8 %
NEUTROS PCT: 50 %
Neutro Abs: 2.8 10*3/uL (ref 1.7–7.7)
Platelets: 160 10*3/uL (ref 150–400)
RBC: 5.49 MIL/uL (ref 4.22–5.81)
RDW: 14.6 % (ref 11.5–15.5)
WBC: 5.7 10*3/uL (ref 4.0–10.5)

## 2017-05-10 LAB — URINALYSIS, ROUTINE W REFLEX MICROSCOPIC
BILIRUBIN URINE: NEGATIVE
GLUCOSE, UA: NEGATIVE mg/dL
HGB URINE DIPSTICK: NEGATIVE
Ketones, ur: NEGATIVE mg/dL
Leukocytes, UA: NEGATIVE
Nitrite: NEGATIVE
PROTEIN: NEGATIVE mg/dL
Specific Gravity, Urine: 1.005 (ref 1.005–1.030)
pH: 5 (ref 5.0–8.0)

## 2017-05-10 LAB — COMPREHENSIVE METABOLIC PANEL
ALBUMIN: 3.3 g/dL — AB (ref 3.5–5.0)
ALT: 20 U/L (ref 17–63)
AST: 40 U/L (ref 15–41)
Alkaline Phosphatase: 62 U/L (ref 38–126)
Anion gap: 11 (ref 5–15)
BILIRUBIN TOTAL: 0.7 mg/dL (ref 0.3–1.2)
BUN: 18 mg/dL (ref 6–20)
CO2: 21 mmol/L — AB (ref 22–32)
Calcium: 8.9 mg/dL (ref 8.9–10.3)
Chloride: 103 mmol/L (ref 101–111)
Creatinine, Ser: 1.29 mg/dL — ABNORMAL HIGH (ref 0.61–1.24)
GFR calc Af Amer: 60 mL/min — ABNORMAL LOW (ref >60)
GFR calc non Af Amer: 51 mL/min — ABNORMAL LOW (ref >60)
GLUCOSE: 98 mg/dL (ref 65–99)
POTASSIUM: 3.6 mmol/L (ref 3.5–5.1)
Sodium: 135 mmol/L (ref 135–145)
TOTAL PROTEIN: 7.1 g/dL (ref 6.5–8.1)

## 2017-05-10 LAB — RAPID URINE DRUG SCREEN, HOSP PERFORMED
AMPHETAMINES: NOT DETECTED
Barbiturates: NOT DETECTED
Benzodiazepines: NOT DETECTED
Cocaine: POSITIVE — AB
Opiates: NOT DETECTED
Tetrahydrocannabinol: NOT DETECTED

## 2017-05-10 LAB — BRAIN NATRIURETIC PEPTIDE: B Natriuretic Peptide: 51.8 pg/mL (ref 0.0–100.0)

## 2017-05-10 LAB — D-DIMER, QUANTITATIVE (NOT AT ARMC): D DIMER QUANT: 0.33 ug{FEU}/mL (ref 0.00–0.50)

## 2017-05-10 LAB — TROPONIN I

## 2017-05-10 LAB — MAGNESIUM: MAGNESIUM: 1.6 mg/dL — AB (ref 1.7–2.4)

## 2017-05-10 LAB — I-STAT TROPONIN, ED: Troponin i, poc: 0.01 ng/mL (ref 0.00–0.08)

## 2017-05-10 LAB — TSH: TSH: 2.174 u[IU]/mL (ref 0.350–4.500)

## 2017-05-10 MED ORDER — ONDANSETRON HCL 4 MG/2ML IJ SOLN: 4.0000 mg | Freq: Four times a day (QID) | INTRAMUSCULAR | Status: DC | PRN

## 2017-05-10 MED ORDER — ISOSORBIDE MONONITRATE ER 30 MG PO TB24
15.0000 mg | ORAL_TABLET | Freq: Every day | ORAL | Status: DC
  Administered 2017-05-11 – 2017-05-14 (×4): 15 mg via ORAL
  Filled 2017-05-10 (×4): qty 1

## 2017-05-10 MED ORDER — ADULT MULTIVITAMIN W/MINERALS CH
1.0000 | ORAL_TABLET | Freq: Every day | ORAL | Status: DC
  Administered 2017-05-11 – 2017-05-14 (×4): 1 via ORAL
  Filled 2017-05-10 (×4): qty 1

## 2017-05-10 MED ORDER — LORAZEPAM 1 MG PO TABS
1.0000 mg | ORAL_TABLET | Freq: Four times a day (QID) | ORAL | Status: AC | PRN
  Filled 2017-05-10: qty 1

## 2017-05-10 MED ORDER — ACETAMINOPHEN 650 MG RE SUPP: 650.0000 mg | Freq: Four times a day (QID) | RECTAL | Status: DC | PRN

## 2017-05-10 MED ORDER — FOLIC ACID 1 MG PO TABS
1.0000 mg | ORAL_TABLET | Freq: Every day | ORAL | Status: DC
  Administered 2017-05-10 – 2017-05-14 (×5): 1 mg via ORAL
  Filled 2017-05-10 (×5): qty 1

## 2017-05-10 MED ORDER — THIAMINE HCL 100 MG/ML IJ SOLN: 100.0000 mg | Freq: Every day | INTRAMUSCULAR | Status: DC

## 2017-05-10 MED ORDER — VITAMIN B-1 100 MG PO TABS
100.0000 mg | ORAL_TABLET | Freq: Every day | ORAL | Status: DC
  Administered 2017-05-10 – 2017-05-11 (×2): 100 mg via ORAL
  Filled 2017-05-10 (×2): qty 1

## 2017-05-10 MED ORDER — ACETAMINOPHEN 325 MG PO TABS
650.0000 mg | ORAL_TABLET | Freq: Four times a day (QID) | ORAL | Status: DC | PRN
  Administered 2017-05-14: 650 mg via ORAL
  Filled 2017-05-10: qty 2

## 2017-05-10 MED ORDER — SODIUM CHLORIDE 0.9 % IV SOLN: INTRAVENOUS | Status: DC

## 2017-05-10 MED ORDER — ASPIRIN EC 81 MG PO TBEC
81.0000 mg | DELAYED_RELEASE_TABLET | Freq: Every day | ORAL | Status: DC
  Administered 2017-05-11 – 2017-05-14 (×4): 81 mg via ORAL
  Filled 2017-05-10 (×4): qty 1

## 2017-05-10 MED ORDER — ONDANSETRON HCL 4 MG PO TABS: 4.0000 mg | ORAL_TABLET | Freq: Four times a day (QID) | ORAL | Status: DC | PRN

## 2017-05-10 MED ORDER — LORAZEPAM 2 MG/ML IJ SOLN: 1.0000 mg | Freq: Four times a day (QID) | INTRAMUSCULAR | Status: AC | PRN

## 2017-05-10 MED ORDER — SODIUM CHLORIDE 0.9% FLUSH
3.0000 mL | Freq: Two times a day (BID) | INTRAVENOUS | Status: DC
  Administered 2017-05-10 – 2017-05-13 (×8): 3 mL via INTRAVENOUS

## 2017-05-10 MED ORDER — FUROSEMIDE 40 MG PO TABS
40.0000 mg | ORAL_TABLET | Freq: Two times a day (BID) | ORAL | Status: DC
  Administered 2017-05-10 – 2017-05-14 (×8): 40 mg via ORAL
  Filled 2017-05-10 (×8): qty 1

## 2017-05-10 MED ORDER — ALBUTEROL SULFATE (2.5 MG/3ML) 0.083% IN NEBU: 2.5000 mg | INHALATION_SOLUTION | Freq: Four times a day (QID) | RESPIRATORY_TRACT | Status: DC | PRN
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
## 2017-05-10 NOTE — ED Notes (Signed)
ED Provider at bedside. 
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
## 2017-05-10 NOTE — ED Triage Notes (Signed)
Pt presents with syncopal episode this morning that was preceded by weakness and shortness of breath.  EMS reports pt was NSR with episodes of bigeminal PACs and multifocal PVCs.

## 2017-05-10 NOTE — ED Notes (Signed)
Patient transported to Ultrasound 

## 2017-05-11 ENCOUNTER — Other Ambulatory Visit (HOSPITAL_COMMUNITY): Payer: Medicare HMO

## 2017-05-11 DIAGNOSIS — R55 Syncope and collapse: Secondary | ICD-10-CM | POA: Diagnosis not present

## 2017-05-11 LAB — BASIC METABOLIC PANEL
ANION GAP: 11 (ref 5–15)
BUN: 16 mg/dL (ref 6–20)
CALCIUM: 9 mg/dL (ref 8.9–10.3)
CHLORIDE: 101 mmol/L (ref 101–111)
CO2: 24 mmol/L (ref 22–32)
Creatinine, Ser: 1.28 mg/dL — ABNORMAL HIGH (ref 0.61–1.24)
GFR calc non Af Amer: 52 mL/min — ABNORMAL LOW (ref >60)
Glucose, Bld: 135 mg/dL — ABNORMAL HIGH (ref 65–99)
Potassium: 3.2 mmol/L — ABNORMAL LOW (ref 3.5–5.1)
Sodium: 136 mmol/L (ref 135–145)

## 2017-05-11 LAB — CBC
HCT: 45.4 % (ref 39.0–52.0)
HEMOGLOBIN: 15.7 g/dL (ref 13.0–17.0)
MCH: 30.1 pg (ref 26.0–34.0)
MCHC: 34.6 g/dL (ref 30.0–36.0)
MCV: 87 fL (ref 78.0–100.0)
Platelets: 162 10*3/uL (ref 150–400)
RBC: 5.22 MIL/uL (ref 4.22–5.81)
RDW: 14.6 % (ref 11.5–15.5)
WBC: 5.3 10*3/uL (ref 4.0–10.5)

## 2017-05-11 LAB — GLUCOSE, CAPILLARY
GLUCOSE-CAPILLARY: 101 mg/dL — AB (ref 65–99)
Glucose-Capillary: 123 mg/dL — ABNORMAL HIGH (ref 65–99)

## 2017-05-11 MED ORDER — VITAMIN B-1 100 MG PO TABS
100.0000 mg | ORAL_TABLET | Freq: Every day | ORAL | Status: DC
  Administered 2017-05-12 – 2017-05-14 (×3): 100 mg via ORAL
  Filled 2017-05-11 (×3): qty 1

## 2017-05-11 MED ORDER — POTASSIUM CHLORIDE CRYS ER 20 MEQ PO TBCR
30.0000 meq | EXTENDED_RELEASE_TABLET | Freq: Once | ORAL | Status: AC
  Administered 2017-05-11: 08:00:00 30 meq via ORAL
  Filled 2017-05-11: qty 1

## 2017-05-11 NOTE — Progress Notes (Signed)
PROGRESS NOTE    Billy Parker  SHF:[id number] DOB: [birth year] DOA: 05/10/2017 PCP: Nolene Ebbs, MD    Brief Narrative:  81 y.o. male with a Past Medical History negative for heart failure, CKD, hypertension, alcohol abuse who presents with syncope.   Assessment & Plan:   Principal Problem:   Syncope - currently undergoing work up. - Echocardiogram completed - awaiting carotid dopplers - place order for orthostatic vital signs  Active Problems:   Essential hypertension - on imdur on board. stable    Mitral regurgitation- moderate   CAD- non obstructive 2014   CKD (chronic kidney disease) stage 3, GFR 30-59 ml/min   Alcohol dependence ([place])   Diastolic dysfunction-grade 3 on echo 05/19/15   Nonischemic cardiomyopathy ([place])    DVT prophylaxis: SCD Code Status: Full Family Communication: none at bedside Disposition Plan: pending work up   Consultants:   none   Procedures: echocardiogram   Antimicrobials: None   Subjective: Pt has no new complaints  Objective: Vitals:   05/10/17 2011 05/11/17 0418 05/11/17 0755 05/11/17 0900  BP: 129/60 139/72  138/77  Pulse: 88 75  72  Resp: 18 17  18   Temp: 98.3 F (36.8 C) 97.4 F (36.3 C)  97.5 F (36.4 C)  TempSrc: Oral Oral  Oral  SpO2: 96% 98% 94% 99%  Weight:  71.9 kg (158 lb 8 oz)    Height:        Intake/Output Summary (Last 24 hours) at 05/11/17 1504 Last data filed at 05/11/17 0947  Gross per 24 hour  Intake              363 ml  Output             1050 ml  Net             -687 ml   Filed Weights   05/10/17 1113 05/10/17 1637 05/11/17 0418  Weight: 72.6 kg (160 lb) 72.4 kg (159 lb 9.6 oz) 71.9 kg (158 lb 8 oz)    Examination:  General exam: Appears calm and comfortable, in nad Respiratory system: Clear to auscultation. Respiratory effort normal. Cardiovascular system: S1 & S2 heard, RRR. No JVD, or murmurs Gastrointestinal system: Abdomen is nondistended, soft and nontender. No organomegaly  or masses felt. Normal bowel sounds heard. Central nervous system: Alert and oriented. No focal neurological deficits. Extremities: Symmetric 5 x 5 power. Skin: No rashes, lesions or ulcers, on limited exam. Psychiatry: Judgement and insight appear normal. Mood & affect appropriate.   Data Reviewed: I have personally reviewed following labs and imaging studies  CBC:  Recent Labs Lab 05/10/17 1207 05/11/17 0344  WBC 5.7 5.3  NEUTROABS 2.8  --   HGB 16.2 15.7  HCT 47.4 45.4  MCV 86.3 87.0  PLT 160 502   Basic Metabolic Panel:  Recent Labs Lab 05/10/17 1207 05/10/17 1528 05/11/17 0344  NA 135  --  136  K 3.6  --  3.2*  CL 103  --  101  CO2 21*  --  24  GLUCOSE 98  --  135*  BUN 18  --  16  CREATININE 1.29*  --  1.28*  CALCIUM 8.9  --  9.0  MG  --  1.6*  --    GFR: Estimated Creatinine Clearance: 42.9 mL/min (A) (by C-G formula based on SCr of 1.28 mg/dL (H)). Liver Function Tests:  Recent Labs Lab 05/10/17 1207  AST 40  ALT 20  ALKPHOS 62  BILITOT 0.7  PROT 7.1  ALBUMIN 3.3*   No results for input(s): LIPASE, AMYLASE in the last 168 hours. No results for input(s): AMMONIA in the last 168 hours. Coagulation Profile: No results for input(s): INR, PROTIME in the last 168 hours. Cardiac Enzymes:  Recent Labs Lab 05/10/17 1757  TROPONINI <0.03   BNP (last 3 results) No results for input(s): PROBNP in the last 8760 hours. HbA1C: No results for input(s): HGBA1C in the last 72 hours. CBG:  Recent Labs Lab 05/11/17 0416 05/11/17 0722  GLUCAP 123* 101*   Lipid Profile: No results for input(s): CHOL, HDL, LDLCALC, TRIG, CHOLHDL, LDLDIRECT in the last 72 hours. Thyroid Function Tests:  Recent Labs  05/10/17 1528  TSH 2.174   Anemia Panel: No results for input(s): VITAMINB12, FOLATE, FERRITIN, TIBC, IRON, RETICCTPCT in the last 72 hours. Sepsis Labs: No results for input(s): PROCALCITON, LATICACIDVEN in the last 168 hours.  No results found for  this or any previous visit (from the past 240 hour(s)).       Radiology Studies: Ct Head Wo Contrast  Result Date: 05/10/2017 CLINICAL DATA:  Syncopal episode. Patient fell striking frontal area. Headache. EXAM: CT HEAD WITHOUT CONTRAST TECHNIQUE: Contiguous axial images were obtained from the base of the skull through the vertex without intravenous contrast. COMPARISON:  01/20/2004. FINDINGS: Brain: No evidence for acute infarction, hemorrhage, mass lesion, hydrocephalus, or extra-axial fluid. Mild atrophy, not unexpected for age. Slight hypoattenuation of white matter favored to represent small vessel disease. Vascular: Advanced vascular calcification in the carotid siphons. This also involves the distal vertebral arteries. No signs of large vessel occlusion. Skull: Normal. Negative for fracture or focal lesion. Sinuses/Orbits: Mild mucosal thickening in the posterior ethmoids. No layering fluid. No orbital findings. Other: No significant scalp hematoma or foreign body. IMPRESSION: Mild atrophy and small vessel disease. No acute intracranial findings. Specifically, no skull fracture or intracranial hemorrhage. No layering sinus fluid or significant scalp hematoma. Electronically Signed   By: Staci Righter M.D.   On: 05/10/2017 14:25   US Abdomen Limited  Result Date: 05/10/2017 CLINICAL DATA:  Abdominal distension. Evaluate for ascites. History of alcohol and polysubstance abuse. EXAM: LIMITED ABDOMINAL ULTRASOUND COMPARISON:  CT, 05/04/2010 FINDINGS: No ascites is noted within the peritoneal cavity. IMPRESSION: No ascites. Electronically Signed   By: Lajean Manes M.D.   On: 05/10/2017 16:04   Dg Chest Portable 1 View  Result Date: 05/10/2017 CLINICAL DATA:  Syncope, irregular heartbeat, shortness of Breath EXAM: PORTABLE CHEST 1 VIEW COMPARISON:  08/15/2016 FINDINGS: Heart and mediastinal contours are within normal limits. No focal opacities or effusions. No acute bony abnormality. IMPRESSION:  No active disease. Electronically Signed   By: Rolm Baptise M.D.   On: 05/10/2017 12:46        Scheduled Meds: . aspirin EC  81 mg Oral Daily  . folic acid  1 mg Oral Daily  . furosemide  40 mg Oral BID  . isosorbide mononitrate  15 mg Oral Daily  . multivitamin with minerals  1 tablet Oral Daily  . sodium chloride flush  3 mL Intravenous Q12H  . [START ON 05/12/2017] thiamine  100 mg Oral Daily   Continuous Infusions:   LOS: 0 days   Time spent: > 35 minutes  Velvet Bathe, MD [facility] Pager [phone]  If 7PM-7AM, please contact night-coverage [email or web address] Password [facility] 05/11/2017, 3:04 PM

## 2017-05-12 ENCOUNTER — Observation Stay (HOSPITAL_BASED_OUTPATIENT_CLINIC_OR_DEPARTMENT_OTHER): Payer: Medicare Other

## 2017-05-12 DIAGNOSIS — I10 Essential (primary) hypertension: Secondary | ICD-10-CM

## 2017-05-12 DIAGNOSIS — R55 Syncope and collapse: Secondary | ICD-10-CM | POA: Diagnosis not present

## 2017-05-12 DIAGNOSIS — I340 Nonrheumatic mitral (valve) insufficiency: Secondary | ICD-10-CM | POA: Diagnosis not present

## 2017-05-12 LAB — ECHOCARDIOGRAM COMPLETE
EERAT: 7.31
FS: 19 % — AB (ref 28–44)
HEIGHTINCHES: 66 in
IV/PV OW: 0.76
LA diam end sys: 37 mm
LA diam index: 2.06 cm/m2
LA vol A4C: 37.7 ml
LASIZE: 37 mm
LAVOL: 40.4 mL
LAVOLIN: 22.4 mL/m2
LDCA: 3.46 cm2
LV E/e'average: 7.31
LVEEMED: 7.31
LVELAT: 7.77 cm/s
LVOT SV: 63 mL
LVOT VTI: 18.1 cm
LVOT diameter: 21 mm
LVOT peak vel: 87.2 cm/s
MV pk A vel: 116 m/s
MVPKEVEL: 56.8 m/s
PW: 12.1 mm — AB (ref 0.6–1.1)
TDI e' lateral: 7.77
TDI e' medial: 5.29
WEIGHTICAEL: 2508.8 [oz_av]

## 2017-05-12 LAB — GLUCOSE, CAPILLARY: GLUCOSE-CAPILLARY: 122 mg/dL — AB (ref 65–99)

## 2017-05-12 MED ORDER — POTASSIUM CHLORIDE CRYS ER 20 MEQ PO TBCR
40.0000 meq | EXTENDED_RELEASE_TABLET | Freq: Once | ORAL | Status: AC
  Administered 2017-05-12: 40 meq via ORAL
  Filled 2017-05-12: qty 2

## 2017-05-12 MED ORDER — MAGNESIUM SULFATE IN D5W 1-5 GM/100ML-% IV SOLN
1.0000 g | Freq: Once | INTRAVENOUS | Status: AC
  Administered 2017-05-12: 1 g via INTRAVENOUS
  Filled 2017-05-12: qty 100

## 2017-05-12 NOTE — Care Management Obs Status (Signed)
[facility] NOTIFICATION   Patient Details  Name: Billy Parker MRN: [id number] Date of Birth: [birth year]   Medicare Observation Status Notification Given:  Yes    Dellie Catholic, RN 05/12/2017, 3:23 PM

## 2017-05-12 NOTE — Progress Notes (Signed)
PT eval addendum-added G-codes    05-26-2017 1212  PT G-Codes **NOT FOR INPATIENT CLASS**  Functional Assessment Tool Used Clinical judgement  Functional Limitation Mobility: Walking and moving around  Mobility: Walking and Moving Around Current Status (R5188) CI  Mobility: Walking and Moving Around Goal Status (C1660) CI    Wray Kearns, PT, DPT [phone]

## 2017-05-12 NOTE — Progress Notes (Signed)
Pt educated about safety and importance of bed alarm during the night however pt refuses to be on bed alarm. Will continue to round on patient.   Ashvik Grundman, RN    

## 2017-05-12 NOTE — Evaluation (Signed)
Physical Therapy Evaluation Patient Details Name: Billy Parker MRN: [id number] DOB: [birth year] Today's Date: 05/12/2017   History of Present Illness  Patient is a 81 y/o male who presents with syncope and SOB. Head CT-unremarkable. CXR-negative. PMH includes CHF, cardiomyopathy with EF 30%, CKD, HTN, cocaine abuse, alcohol abuse, NSVT, CAD  Clinical Impression  Patient presents with mild balance deficits and dizziness impacting mobility. Pt with negative orthostatics. See below. Sitting BP 132/81 Standing BP 150/99 with some dizziness that resolved Standing BP post ambulation 174/73 Tolerated gait training with Min guard assist for safety. Uses [employer] and walks himself to the store when his friend cannot drive him. Education re: fall reduction risk and to wait a few mins with changes in position to decrease chance of falling. Pt agreeable. Will follow acutely to perform higher level balance challenges so pt can safely return to PLOF.     Follow Up Recommendations No PT follow up;Supervision - Intermittent    Equipment Recommendations  None recommended by PT    Recommendations for Other Services OT consult     Precautions / Restrictions Precautions Precautions: Fall Precaution Comments: dizziness Restrictions Weight Bearing Restrictions: No      Mobility  Bed Mobility               General bed mobility comments: Sitting in chair upon PT arrival.   Transfers Overall transfer level: Needs assistance Equipment used: None Transfers: Sit to/from Stand Sit to Stand: Min guard         General transfer comment: Stood from chair without difficulty. Min guard due to reports of dizziness and unsteadiness.  Holding onto chair with 1 UE. Resolved withing a few seconds.  Ambulation/Gait Ambulation/Gait assistance: Min guard Ambulation Distance (Feet): 200 Feet Assistive device: None Gait Pattern/deviations: Step-through pattern;Decreased stride length;Staggering  left;Staggering right Gait velocity: decreased   General Gait Details: Slow, mostly steady gait with a few instances of staggering with head turns or when distracted but no LOB; able to self correct. No dizziness with mobility.   Stairs            Wheelchair Mobility    Modified Rankin (Stroke Patients Only)       Balance Overall balance assessment: Needs assistance;History of Falls Sitting-balance support: Feet supported;No upper extremity supported Sitting balance-Leahy Scale: Good     Standing balance support: During functional activity Standing balance-Leahy Scale: Fair                               Pertinent Vitals/Pain Pain Assessment: No/denies pain    Home Living Family/patient expects to be discharged to:: Private residence Living Arrangements: Non-relatives/Friends Available Help at Discharge: Friend(s);Available PRN/intermittently Type of Home: House Home Access: Level entry     Home Layout: One level Home Equipment: Cane - single point      Prior Function Level of Independence: Independent with assistive device(s)         Comments: Friend drives him or he walks. Uses [employer] [facility] mostly in the community.      Hand Dominance        Extremity/Trunk Assessment   Upper Extremity Assessment Upper Extremity Assessment: Defer to OT evaluation    Lower Extremity Assessment Lower Extremity Assessment: Overall WFL for tasks assessed       Communication   Communication: No difficulties  Cognition Arousal/Alertness: Awake/alert Behavior During Therapy: WFL for tasks assessed/performed Overall Cognitive Status: Within Functional Limits for tasks  assessed (Appears WFL during session, gets distracted and goes on tangents. Not a great historian when reporting symptoms.)                                        General Comments General comments (skin integrity, edema, etc.): VSS. See assessment for BP details.     Exercises     Assessment/Plan    PT Assessment Patient needs continued PT services  PT Problem List Decreased mobility;Pain;Decreased balance;Decreased activity tolerance;Cardiopulmonary status limiting activity       PT Treatment Interventions Therapeutic activities;Gait training;Patient/family education;Therapeutic exercise;Balance training;Functional mobility training    PT Goals (Current goals can be found in the Care Plan section)  Acute Rehab PT Goals Patient Stated Goal: to stop being dizzy PT Goal Formulation: With patient Time For Goal Achievement: 05/26/17 Potential to Achieve Goals: Good    Frequency Min 3X/week   Barriers to discharge Decreased caregiver support      Co-evaluation               AM-PAC PT "6 Clicks" Daily Activity  Outcome Measure Difficulty turning over in bed (including adjusting bedclothes, sheets and blankets)?: None Difficulty moving from lying on back to sitting on the side of the bed? : None Difficulty sitting down on and standing up from a chair with arms (e.g., wheelchair, bedside commode, etc,.)?: None Help needed moving to and from a bed to chair (including a wheelchair)?: A Little Help needed walking in hospital room?: None Help needed climbing 3-5 steps with a railing? : A Little 6 Click Score: 22    End of Session Equipment Utilized During Treatment: Gait belt Activity Tolerance: Patient tolerated treatment well;Other (comment) (dizziness) Patient left: in bed;with call bell/phone within reach (sitting EOB eating lunch) Nurse Communication: Mobility status PT Visit Diagnosis: Unsteadiness on feet (R26.81);History of falling (Z91.81)    Time: 6213-0865 PT Time Calculation (min) (ACUTE ONLY): 21 min   Charges:   PT Evaluation $PT Eval Low Complexity: 1 Procedure     PT G Codes:        Wray Kearns, PT, DPT [phone]    Marguarite Arbour A Ruxin Ransome 05/12/2017, 12:07 PM

## 2017-05-12 NOTE — Progress Notes (Addendum)
Magnesium Sulfate IVPB and Potassium PO ordered.  Will continue to monitor.   Ioannis Schuh, RN

## 2017-05-12 NOTE — Progress Notes (Signed)
  Echocardiogram 2D Echocardiogram has been performed.  Billy Parker 05/12/2017, 9:04 AM

## 2017-05-12 NOTE — Progress Notes (Signed)
PROGRESS NOTE    Cloyce Blankenhorn  AVW:[id number] DOB: [birth year] DOA: 05/10/2017 PCP: Nolene Ebbs, MD    Brief Narrative:  81 y.o. male with a Past Medical History negative for heart failure, CKD, hypertension, alcohol abuse who presents with syncope.   Patient reports feeling dizzy when standing.   Assessment & Plan:   Principal Problem:   Syncope - currently undergoing work up. - Echocardiogram completed but not resulted - awaiting carotid dopplers pending - positive orthostatic vital signs as such will place order for ted hose compressions.  Active Problems:   Essential hypertension - on imdur on board. Stable. May have to d/c imdur if continued dizziness with standing.    Mitral regurgitation- moderate   CAD- non obstructive 2014   CKD (chronic kidney disease) stage 3, GFR 30-59 ml/min   Alcohol dependence ([place])   Diastolic dysfunction-grade 3 on echo 05/19/15   Nonischemic cardiomyopathy (HCC)    DVT prophylaxis: SCD Code Status: Full Family Communication: none at bedside Disposition Plan: In my opinion patient at risk of further falls and harm if released at this point. I recommend further evaluation and treatment plans prior to discharging.   Consultants:   none   Procedures: echocardiogram   Antimicrobials: None   Subjective: Pt has no new complaints  Objective: Vitals:   05/11/17 1945 05/12/17 0019 05/12/17 0512 05/12/17 1300  BP:  109/66 137/83 108/74  Pulse:  (!) 59 98 (!) 56  Resp: 18 18 18 20   Temp: 98.3 F (36.8 C) 98.4 F (36.9 C) 97.9 F (36.6 C) 97.8 F (36.6 C)  TempSrc: Oral Oral Oral Oral  SpO2: 97% 97% 97% 98%  Weight:   71.1 kg (156 lb 12.8 oz)   Height:        Intake/Output Summary (Last 24 hours) at 05/12/17 1449 Last data filed at 05/12/17 1300  Gross per 24 hour  Intake              483 ml  Output             1331 ml  Net             -848 ml   Filed Weights   05/10/17 1637 05/11/17 0418 05/12/17 0512  Weight:  72.4 kg (159 lb 9.6 oz) 71.9 kg (158 lb 8 oz) 71.1 kg (156 lb 12.8 oz)    Examination:  General exam: Appears calm and comfortable, in nad Respiratory system: Clear to auscultation. Respiratory effort normal. Cardiovascular system: S1 & S2 heard, RRR. No JVD, or murmurs Gastrointestinal system: Abdomen is nondistended, soft and nontender. No organomegaly or masses felt. Normal bowel sounds heard. Central nervous system: Alert and oriented. No focal neurological deficits. Extremities: Symmetric 5 x 5 power. Skin: No rashes, lesions or ulcers, on limited exam. Psychiatry: Judgement and insight appear normal. Mood & affect appropriate.   Data Reviewed: I have personally reviewed following labs and imaging studies  CBC:  Recent Labs Lab 05/10/17 1207 05/11/17 0344  WBC 5.7 5.3  NEUTROABS 2.8  --   HGB 16.2 15.7  HCT 47.4 45.4  MCV 86.3 87.0  PLT 160 829   Basic Metabolic Panel:  Recent Labs Lab 05/10/17 1207 05/10/17 1528 05/11/17 0344  NA 135  --  136  K 3.6  --  3.2*  CL 103  --  101  CO2 21*  --  24  GLUCOSE 98  --  135*  BUN 18  --  16  CREATININE 1.29*  --  1.28*  CALCIUM 8.9  --  9.0  MG  --  1.6*  --    GFR: Estimated Creatinine Clearance: 42.9 mL/min (A) (by C-G formula based on SCr of 1.28 mg/dL (H)). Liver Function Tests:  Recent Labs Lab 05/10/17 1207  AST 40  ALT 20  ALKPHOS 62  BILITOT 0.7  PROT 7.1  ALBUMIN 3.3*   No results for input(s): LIPASE, AMYLASE in the last 168 hours. No results for input(s): AMMONIA in the last 168 hours. Coagulation Profile: No results for input(s): INR, PROTIME in the last 168 hours. Cardiac Enzymes:  Recent Labs Lab 05/10/17 1757  TROPONINI <0.03   BNP (last 3 results) No results for input(s): PROBNP in the last 8760 hours. HbA1C: No results for input(s): HGBA1C in the last 72 hours. CBG:  Recent Labs Lab 05/11/17 0416 05/11/17 0722 05/12/17 0641  GLUCAP 123* 101* 122*   Lipid Profile: No  results for input(s): CHOL, HDL, LDLCALC, TRIG, CHOLHDL, LDLDIRECT in the last 72 hours. Thyroid Function Tests:  Recent Labs  05/10/17 1528  TSH 2.174   Anemia Panel: No results for input(s): VITAMINB12, FOLATE, FERRITIN, TIBC, IRON, RETICCTPCT in the last 72 hours. Sepsis Labs: No results for input(s): PROCALCITON, LATICACIDVEN in the last 168 hours.  No results found for this or any previous visit (from the past 240 hour(s)).       Radiology Studies: US Abdomen Limited  Result Date: 05/10/2017 CLINICAL DATA:  Abdominal distension. Evaluate for ascites. History of alcohol and polysubstance abuse. EXAM: LIMITED ABDOMINAL ULTRASOUND COMPARISON:  CT, 05/04/2010 FINDINGS: No ascites is noted within the peritoneal cavity. IMPRESSION: No ascites. Electronically Signed   By: Lajean Manes M.D.   On: 05/10/2017 16:04        Scheduled Meds: . aspirin EC  81 mg Oral Daily  . folic acid  1 mg Oral Daily  . furosemide  40 mg Oral BID  . isosorbide mononitrate  15 mg Oral Daily  . multivitamin with minerals  1 tablet Oral Daily  . sodium chloride flush  3 mL Intravenous Q12H  . thiamine  100 mg Oral Daily   Continuous Infusions:   LOS: 0 days   Time spent: > 35 minutes  Velvet Bathe, MD [facility] Pager [phone]  If 7PM-7AM, please contact night-coverage [email or web address] Password [facility] 05/12/2017, 2:49 PM

## 2017-05-13 ENCOUNTER — Observation Stay (HOSPITAL_BASED_OUTPATIENT_CLINIC_OR_DEPARTMENT_OTHER): Payer: Medicare Other

## 2017-05-13 ENCOUNTER — Encounter (HOSPITAL_COMMUNITY): Payer: Self-pay | Admitting: Physician Assistant

## 2017-05-13 DIAGNOSIS — R55 Syncope and collapse: Secondary | ICD-10-CM

## 2017-05-13 DIAGNOSIS — E876 Hypokalemia: Secondary | ICD-10-CM

## 2017-05-13 DIAGNOSIS — F1410 Cocaine abuse, uncomplicated: Secondary | ICD-10-CM | POA: Diagnosis not present

## 2017-05-13 DIAGNOSIS — I519 Heart disease, unspecified: Secondary | ICD-10-CM

## 2017-05-13 DIAGNOSIS — I10 Essential (primary) hypertension: Secondary | ICD-10-CM

## 2017-05-13 LAB — GLUCOSE, CAPILLARY: Glucose-Capillary: 136 mg/dL — ABNORMAL HIGH (ref 65–99)

## 2017-05-13 LAB — PHOSPHORUS: PHOSPHORUS: 2.8 mg/dL (ref 2.5–4.6)

## 2017-05-13 LAB — MAGNESIUM: Magnesium: 2.1 mg/dL (ref 1.7–2.4)

## 2017-05-13 LAB — BASIC METABOLIC PANEL
Anion gap: 11 (ref 5–15)
BUN: 20 mg/dL (ref 6–20)
CALCIUM: 9.3 mg/dL (ref 8.9–10.3)
CO2: 25 mmol/L (ref 22–32)
CREATININE: 1.26 mg/dL — AB (ref 0.61–1.24)
Chloride: 101 mmol/L (ref 101–111)
GFR calc Af Amer: >60 mL/min (ref >60)
GFR, EST NON AFRICAN AMERICAN: 53 mL/min — AB (ref >60)
Glucose, Bld: 132 mg/dL — ABNORMAL HIGH (ref 65–99)
POTASSIUM: 4.3 mmol/L (ref 3.5–5.1)
SODIUM: 137 mmol/L (ref 135–145)

## 2017-05-13 NOTE — Progress Notes (Signed)
Pt had 8 beats of VT again after given Magnesium IV and Potassium PO. MD on call Opy[facility] notified. Pt asleep. Will continue to monitor.  Arlys Scatena, RN

## 2017-05-13 NOTE — Progress Notes (Signed)
PROGRESS NOTE    Billy Parker  [id number]:[id number] DOB: Sep 07, 1936 DOA: 05/10/2017 PCP: Nolene Ebbs, MD    Brief Narrative:  81 y.o. male with a Past Medical History negative for heart failure, CKD, hypertension, alcohol abuse who presents with syncope.   Patient reports feeling dizzy when standing.   Assessment & Plan:   Principal Problem:   Syncope - currently undergoing work up. - Echocardiogram completed but not resulted - awaiting carotid dopplers pending - positive orthostatic vital signs as such will placed order for ted hose compressions, improvement. [facility] stay complicated by run of non sustained V tach. Given cardiac history and recent syncope consulted Cardiology.  Active Problems:   Essential hypertension - on imdur on board. Stable. May have to d/c imdur if continued dizziness with standing.    Mitral regurgitation- moderate   CAD- non obstructive 2014   CKD (chronic kidney disease) stage 3, GFR 30-59 ml/min   Alcohol dependence (HCC)   Diastolic dysfunction-grade 3 on echo 05/19/15   Nonischemic cardiomyopathy (HCC)    DVT prophylaxis: SCD Code Status: Full Family Communication: none at bedside Disposition Plan: non sustained v tach in lieu of recent history of syncope. Pt requires further evaluation.   Consultants:   Cardiology   Procedures: echocardiogram   Antimicrobials: None   Subjective: Pt has no new complaints  Objective: Vitals:   05/12/17 2115 05/12/17 2122 05/12/17 2347 05/13/17 0628  BP:   (!) 142/80 117/65  Pulse:   80 67  Resp: 20   18  Temp: 98.3 F (36.8 C)   98.1 F (36.7 C)  TempSrc: Oral   Oral  SpO2:  97%  97%  Weight:    71.1 kg (156 lb 12.8 oz)  Height:        Intake/Output Summary (Last 24 hours) at 05/13/17 1424 Last data filed at 05/13/17 1123  Gross per 24 hour  Intake              480 ml  Output             1400 ml  Net             -920 ml   Filed Weights   05/11/17 0418 05/12/17 0512 05/13/17  0628  Weight: 71.9 kg (158 lb 8 oz) 71.1 kg (156 lb 12.8 oz) 71.1 kg (156 lb 12.8 oz)    Examination:  General exam: Appears calm and comfortable, in nad Respiratory system: Clear to auscultation. Respiratory effort normal. Cardiovascular system: S1 & S2 heard, RRR. No JVD, or murmurs Gastrointestinal system: Abdomen is nondistended, soft and nontender. No organomegaly or masses felt. Normal bowel sounds heard. Central nervous system: Alert and oriented. No focal neurological deficits. Extremities: Symmetric 5 x 5 power. Skin: No rashes, lesions or ulcers, on limited exam. Psychiatry: Judgement and insight appear normal. Mood & affect appropriate.   Data Reviewed: I have personally reviewed following labs and imaging studies  CBC:  Recent Labs Lab 05/10/17 1207 05/11/17 0344  WBC 5.7 5.3  NEUTROABS 2.8  --   HGB 16.2 15.7  HCT 47.4 45.4  MCV 86.3 87.0  PLT 160 979   Basic Metabolic Panel:  Recent Labs Lab 05/10/17 1207 05/10/17 1528 05/11/17 0344 05/13/17 0952  NA 135  --  136 137  K 3.6  --  3.2* 4.3  CL 103  --  101 101  CO2 21*  --  24 25  GLUCOSE 98  --  135* 132*  BUN 18  --  16 20  CREATININE 1.29*  --  1.28* 1.26*  CALCIUM 8.9  --  9.0 9.3  MG  --  1.6*  --  2.1  PHOS  --   --   --  2.8   GFR: Estimated Creatinine Clearance: 43.6 mL/min (A) (by C-G formula based on SCr of 1.26 mg/dL (H)). Liver Function Tests:  Recent Labs Lab 05/10/17 1207  AST 40  ALT 20  ALKPHOS 62  BILITOT 0.7  PROT 7.1  ALBUMIN 3.3*   No results for input(s): LIPASE, AMYLASE in the last 168 hours. No results for input(s): AMMONIA in the last 168 hours. Coagulation Profile: No results for input(s): INR, PROTIME in the last 168 hours. Cardiac Enzymes:  Recent Labs Lab 05/10/17 1757  TROPONINI <0.03   BNP (last 3 results) No results for input(s): PROBNP in the last 8760 hours. HbA1C: No results for input(s): HGBA1C in the last 72 hours. CBG:  Recent Labs Lab  05/11/17 0416 05/11/17 0722 05/12/17 0641 05/13/17 0624  GLUCAP 123* 101* 122* 136*   Lipid Profile: No results for input(s): CHOL, HDL, LDLCALC, TRIG, CHOLHDL, LDLDIRECT in the last 72 hours. Thyroid Function Tests:  Recent Labs  05/10/17 1528  TSH 2.174   Anemia Panel: No results for input(s): VITAMINB12, FOLATE, FERRITIN, TIBC, IRON, RETICCTPCT in the last 72 hours. Sepsis Labs: No results for input(s): PROCALCITON, LATICACIDVEN in the last 168 hours.  No results found for this or any previous visit (from the past 240 hour(s)).   Radiology Studies: No results found.   Scheduled Meds: . aspirin EC  81 mg Oral Daily  . folic acid  1 mg Oral Daily  . furosemide  40 mg Oral BID  . isosorbide mononitrate  15 mg Oral Daily  . multivitamin with minerals  1 tablet Oral Daily  . sodium chloride flush  3 mL Intravenous Q12H  . thiamine  100 mg Oral Daily   Continuous Infusions:   LOS: 0 days   Time spent: > 35 minutes  Velvet Bathe, MD [facility] Pager [phone]  If 7PM-7AM, please contact night-coverage [email or web address] Password [facility] 05/13/2017, 2:24 PM

## 2017-05-13 NOTE — Progress Notes (Signed)
**  Preliminary report by tech**  Carotid artery duplex complete. Findings are consistent with a 1-39 percent stenosis involving the right internal carotid artery and the left internal carotid artery. The vertebral arteries demonstrate antegrade flow. However, the vertebral arteries demonstrate a high resistive waveform of an unknown etiology.  05/13/17 4:33 PM Billy Parker RVT

## 2017-05-13 NOTE — Progress Notes (Signed)
Physical Therapy Treatment Patient Details Name: Billy Parker MRN: [id number] DOB: [birth year] Today's Date: 05/13/2017    History of Present Illness Patient is a 81 y/o male who presents with syncope and SOB. Head CT-unremarkable. CXR-negative. PMH includes CHF, cardiomyopathy with EF 30%, CKD, HTN, cocaine abuse, alcohol abuse, NSVT, CAD    PT Comments    Walked with pt on the hallway for longer trip and reviewed ROM to hips before he asked to take another walk.  He is motivated to improve and get home, understands that activity is essential for his recovery.  No complaints of dizziness today, but is taking his time with PT cueing slow careful movement.  Very much appropriate for continued acute therapy and nursing can assist with gait to further his recovery process.   Follow Up Recommendations  No PT follow up;Supervision - Intermittent     Equipment Recommendations  None recommended by PT    Recommendations for Other Services       Precautions / Restrictions Precautions Precautions: Fall Precaution Comments: minor dizziness Restrictions Weight Bearing Restrictions: No    Mobility  Bed Mobility               General bed mobility comments: up bedside when PT entered  Transfers Overall transfer level: Needs assistance Equipment used: None Transfers: Sit to/from Stand Sit to Stand: Min guard         General transfer comment: stood bedside with no help  Ambulation/Gait Ambulation/Gait assistance: Min guard Ambulation Distance (Feet): 300 Feet Assistive device: None Gait Pattern/deviations: Step-through pattern;Decreased stride length;Wide base of support;Trunk flexed Gait velocity: decreased Gait velocity interpretation: Below normal speed for age/gender General Gait Details: can walk with careful pacing, not unsafe with HHA   Stairs            Wheelchair Mobility    Modified Rankin (Stroke Patients Only)       Balance     Sitting  balance-Leahy Scale: Good     Standing balance support: During functional activity Standing balance-Leahy Scale: Fair                              Cognition Arousal/Alertness: Awake/alert Behavior During Therapy: WFL for tasks assessed/performed Overall Cognitive Status: Within Functional Limits for tasks assessed                                        Exercises      General Comments General comments (skin integrity, edema, etc.): BP was checked and cleared for orthostasis      Pertinent Vitals/Pain Pain Assessment: No/denies pain    Home Living                      Prior Function            PT Goals (current goals can now be found in the care plan section) Acute Rehab PT Goals Patient Stated Goal: get stronger Progress towards PT goals: Progressing toward goals    Frequency    Min 3X/week      PT Plan Current plan remains appropriate    Co-evaluation              AM-PAC PT "6 Clicks" Daily Activity  Outcome Measure  Difficulty turning over in bed (including adjusting bedclothes, sheets and blankets)?: None Difficulty moving from  lying on back to sitting on the side of the bed? : None Difficulty sitting down on and standing up from a chair with arms (e.g., wheelchair, bedside commode, etc,.)?: None Help needed moving to and from a bed to chair (including a wheelchair)?: A Little Help needed walking in hospital room?: A Little Help needed climbing 3-5 steps with a railing? : A Little 6 Click Score: 21    End of Session Equipment Utilized During Treatment: Gait belt Activity Tolerance: Patient tolerated treatment well Patient left: in bed;with call bell/phone within reach (sitting bedside) Nurse Communication: Mobility status PT Visit Diagnosis: Unsteadiness on feet (R26.81);History of falling (Z91.81)     Time: 8675-4492 PT Time Calculation (min) (ACUTE ONLY): 15 min  Charges:  $Gait Training: 8-22  mins                    G Codes:       Ramond Dial Jun 02, 2017, 3:27 PM   Mee Hives, PT MS Acute Rehab Dept. Number: [place] and [place]

## 2017-05-13 NOTE — Progress Notes (Signed)
Patient with no complaints or concerns during 7pm - 7am shift.  Charleton Deyoung, RN 
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
## 2017-05-14 ENCOUNTER — Other Ambulatory Visit: Payer: Self-pay | Admitting: Cardiology

## 2017-05-14 DIAGNOSIS — R55 Syncope and collapse: Secondary | ICD-10-CM | POA: Diagnosis not present

## 2017-05-14 LAB — VAS US CAROTID
LCCADSYS: -54 cm/s
LCCAPDIAS: 11 cm/s
LCCAPSYS: 82 cm/s
LEFT ECA DIAS: -5 cm/s
LEFT VERTEBRAL DIAS: -6 cm/s
Left CCA dist dias: -9 cm/s
Left ICA dist dias: -21 cm/s
Left ICA dist sys: -65 cm/s
Left ICA prox dias: -10 cm/s
Left ICA prox sys: -51 cm/s
RCCADSYS: -48 cm/s
RCCAPSYS: -125 cm/s
RIGHT ECA DIAS: 0 cm/s
RIGHT VERTEBRAL DIAS: -13 cm/s
Right CCA prox dias: -13 cm/s

## 2017-05-14 LAB — GLUCOSE, CAPILLARY: Glucose-Capillary: 123 mg/dL — ABNORMAL HIGH (ref 65–99)

## 2017-05-14 MED ORDER — FUROSEMIDE 40 MG PO TABS: 40.0000 mg | ORAL_TABLET | Freq: Every day | ORAL | 0 refills | Status: DC

## 2017-05-14 MED ORDER — POTASSIUM CHLORIDE CRYS ER 10 MEQ PO TBCR: 10.0000 meq | EXTENDED_RELEASE_TABLET | Freq: Every day | ORAL | 0 refills | Status: DC

## 2017-05-14 NOTE — Discharge Summary (Signed)
Physician Discharge Summary  Billy Parker PIR:[id number] DOB: 07-30-1936 DOA: 05/10/2017  PCP: Billy Ebbs, MD  Admit date: 05/10/2017 Discharge date: 05/14/2017  Time spent: > 35 minutes  Recommendations for Outpatient Follow-up:  1. Obtain holter monitor (cardiology setting up prior to discharge)   Discharge Diagnoses:  Principal Problem:   Syncope Active Problems:   Essential hypertension   H/O Cocaine abuse   Mild mitral regurgitation   CKD (chronic kidney disease) stage 3, GFR 30-59 ml/min   Alcohol dependence (HCC)   Hypokalemia   Diastolic dysfunction-grade 3 on echo 05/19/15   Nonischemic cardiomyopathy ([place])   Orthostatic hypertension   Discharge Condition: stable  Diet recommendation: heart healthy  Filed Weights   05/12/17 0512 05/13/17 0628 05/14/17 0609  Weight: 71.1 kg (156 lb 12.8 oz) 71.1 kg (156 lb 12.8 oz) 71.7 kg (158 lb)    History of present illness:  Billy Parker is a 81 y.o. male with a Past Medical History negative for heart failure, CK D, hypertension, alcohol abuse who presents with syncope  Hospital Course:  Syncope - suspect orthostatic hypotension. Recommend ted hose on d/c. There was non sustained v tach. Discussed with cardiology. Will decrease lasix dose and d/c hctz.  - Recommend holter monitor for 1 month - carotid doppler not reporting significant stricture  For other knonw medical problems listed above will continue medication regimen listed below  Procedures:  Echo  carotid  Consultations:  Cardioloty  Discharge Exam: Vitals:   05/14/17 0609 05/14/17 0821  BP: 137/89 116/84  Pulse: 72 76  Resp:  16  Temp: 98.9 F (37.2 C) 98 F (36.7 C)    General: Pt in nad, alert and awake Cardiovascular: rrr, no murmurs Respiratory: no increased wob, no wheezes  Discharge Instructions   Discharge Instructions    Call MD for:  difficulty breathing, headache or visual disturbances    Complete by:  As directed    Call  MD for:  persistant dizziness or light-headedness    Complete by:  As directed    Diet - low sodium heart healthy    Complete by:  As directed    Discharge instructions    Complete by:  As directed    Please contact your cardiologist to help get a holter monitor to monitor the electrical activity of your heart for the next 1 month.   Increase activity slowly    Complete by:  As directed      Current Discharge Medication List    CONTINUE these medications which have CHANGED   Details  furosemide (LASIX) 40 MG tablet Take 1 tablet (40 mg total) by mouth daily. Qty: 30 tablet, Refills: 0    potassium chloride SA (K-DUR,KLOR-CON) 10 MEQ tablet Take 1 tablet (10 mEq total) by mouth daily. Qty: 30 tablet, Refills: 0      CONTINUE these medications which have NOT CHANGED   Details  albuterol (PROVENTIL HFA;VENTOLIN HFA) 108 (90 BASE) MCG/ACT inhaler Inhale 2 puffs into the lungs every 6 (six) hours as needed for wheezing or shortness of breath. Qty: 1 Inhaler, Refills: 0    aspirin EC 81 MG EC tablet Take 1 tablet (81 mg total) by mouth daily.    isosorbide mononitrate (IMDUR) 30 MG 24 hr tablet Take 0.5 tablets (15 mg total) by mouth daily. Qty: 30 tablet, Refills: 0    Multiple Vitamin (MULTIVITAMIN WITH MINERALS) TABS tablet Take 1 tablet by mouth daily. Qty: 30 tablet, Refills: 0    TOBRADEX ophthalmic ointment  Place 1 drop into both eyes daily as needed for pain. Refills: 0      STOP taking these medications     hydrochlorothiazide (HYDRODIURIL) 12.5 MG tablet        Allergies  Allergen Reactions  . Other Swelling    Antibiotic, doesn't recall name      The results of significant diagnostics from this hospitalization (including imaging, microbiology, ancillary and laboratory) are listed below for reference.    Significant Diagnostic Studies: Ct Head Wo Contrast  Result Date: 05/10/2017 CLINICAL DATA:  Syncopal episode. Patient fell striking frontal area.  Headache. EXAM: CT HEAD WITHOUT CONTRAST TECHNIQUE: Contiguous axial images were obtained from the base of the skull through the vertex without intravenous contrast. COMPARISON:  01/20/2004. FINDINGS: Brain: No evidence for acute infarction, hemorrhage, mass lesion, hydrocephalus, or extra-axial fluid. Mild atrophy, not unexpected for age. Slight hypoattenuation of white matter favored to represent small vessel disease. Vascular: Advanced vascular calcification in the carotid siphons. This also involves the distal vertebral arteries. No signs of large vessel occlusion. Skull: Normal. Negative for fracture or focal lesion. Sinuses/Orbits: Mild mucosal thickening in the posterior ethmoids. No layering fluid. No orbital findings. Other: No significant scalp hematoma or foreign body. IMPRESSION: Mild atrophy and small vessel disease. No acute intracranial findings. Specifically, no skull fracture or intracranial hemorrhage. No layering sinus fluid or significant scalp hematoma. Electronically Signed   By: Staci Righter M.D.   On: 05/10/2017 14:25   US Abdomen Limited  Result Date: 05/10/2017 CLINICAL DATA:  Abdominal distension. Evaluate for ascites. History of alcohol and polysubstance abuse. EXAM: LIMITED ABDOMINAL ULTRASOUND COMPARISON:  CT, 05/04/2010 FINDINGS: No ascites is noted within the peritoneal cavity. IMPRESSION: No ascites. Electronically Signed   By: Lajean Manes M.D.   On: 05/10/2017 16:04   Dg Chest Portable 1 View  Result Date: 05/10/2017 CLINICAL DATA:  Syncope, irregular heartbeat, shortness of Breath EXAM: PORTABLE CHEST 1 VIEW COMPARISON:  08/15/2016 FINDINGS: Heart and mediastinal contours are within normal limits. No focal opacities or effusions. No acute bony abnormality. IMPRESSION: No active disease. Electronically Signed   By: Rolm Baptise M.D.   On: 05/10/2017 12:46    Microbiology: No results found for this or any previous visit (from the past 240 hour(s)).   Labs: Basic  Metabolic Panel:  Recent Labs Lab 05/10/17 1207 05/10/17 1528 05/11/17 0344 05/13/17 0952  NA 135  --  136 137  K 3.6  --  3.2* 4.3  CL 103  --  101 101  CO2 21*  --  24 25  GLUCOSE 98  --  135* 132*  BUN 18  --  16 20  CREATININE 1.29*  --  1.28* 1.26*  CALCIUM 8.9  --  9.0 9.3  MG  --  1.6*  --  2.1  PHOS  --   --   --  2.8   Liver Function Tests:  Recent Labs Lab 05/10/17 1207  AST 40  ALT 20  ALKPHOS 62  BILITOT 0.7  PROT 7.1  ALBUMIN 3.3*   No results for input(s): LIPASE, AMYLASE in the last 168 hours. No results for input(s): AMMONIA in the last 168 hours. CBC:  Recent Labs Lab 05/10/17 1207 05/11/17 0344  WBC 5.7 5.3  NEUTROABS 2.8  --   HGB 16.2 15.7  HCT 47.4 45.4  MCV 86.3 87.0  PLT 160 162   Cardiac Enzymes:  Recent Labs Lab 05/10/17 1757  TROPONINI <0.03   BNP: BNP (last  3 results)  Recent Labs  05/23/16 1442 05/10/17 1207  BNP 276.9* 51.8    ProBNP (last 3 results) No results for input(s): PROBNP in the last 8760 hours.  CBG:  Recent Labs Lab 05/11/17 0416 05/11/17 0722 05/12/17 0641 05/13/17 0624 05/14/17 0608  GLUCAP 123* 101* 122* 136* 123*    Signed:  Velvet Bathe MD.  [facility] 05/14/2017, 10:00 AM

## 2017-05-14 NOTE — Progress Notes (Signed)
PT Cancellation Note  Patient Details Name: Billy Parker MRN: [id number] DOB: 09-26-1936   Cancelled Treatment:    Reason Eval/Treat Not Completed: Patient declined. Pt dressed and just finished getting d/c instructions.  Pt awaiting ride and not interested in PT at this time.   Galen Manila 05/14/2017, 11:02 AM

## 2017-05-14 NOTE — Progress Notes (Signed)
Discharge instructions reviewed with patient. All questions answered at this time. Transport home by family.   Ameilia Rattan, RN 

## 2017-05-15 ENCOUNTER — Ambulatory Visit (INDEPENDENT_AMBULATORY_CARE_PROVIDER_SITE_OTHER): Payer: Medicare Other

## 2017-05-15 DIAGNOSIS — R55 Syncope and collapse: Secondary | ICD-10-CM

## 2017-06-03 ENCOUNTER — Ambulatory Visit: Payer: Medicare Other | Admitting: Cardiology

## 2017-07-15 ENCOUNTER — Encounter: Payer: Self-pay | Admitting: *Deleted

## 2017-07-15 ENCOUNTER — Ambulatory Visit: Payer: Medicare Other | Admitting: Cardiology

## 2018-01-31 IMAGING — CT CT HEAD W/O CM
3 series · 14 of 47 positions shown, 16 images · non-contrast
Comparison: 01/20/2004.

CLINICAL DATA: Syncopal episode. Patient fell striking frontal
area. Headache.

EXAM:
CT HEAD WITHOUT CONTRAST
TECHNIQUE: Contiguous axial images were obtained from the base of the skull
through the vertex without intravenous contrast.

[Series 3: head 5.0 h30s · axial · 0.48mm/px · z∈[-83,+42]mm · 8 of 31 slices shown, 10 images]
[im 3/31  brain]
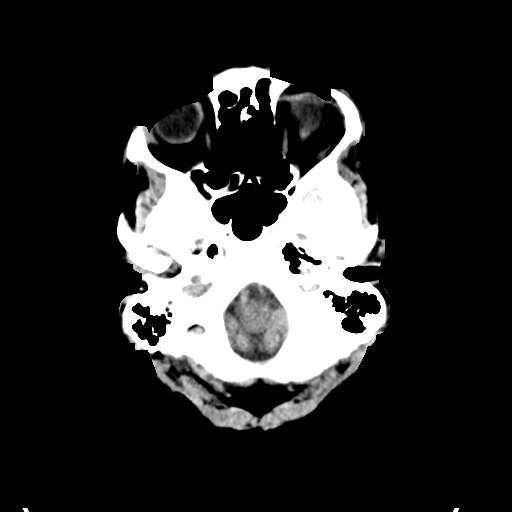
[im 3/31  bone]
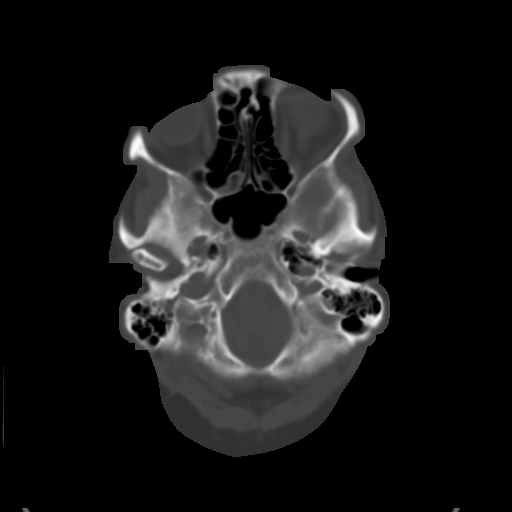
[im 7/31  brain]
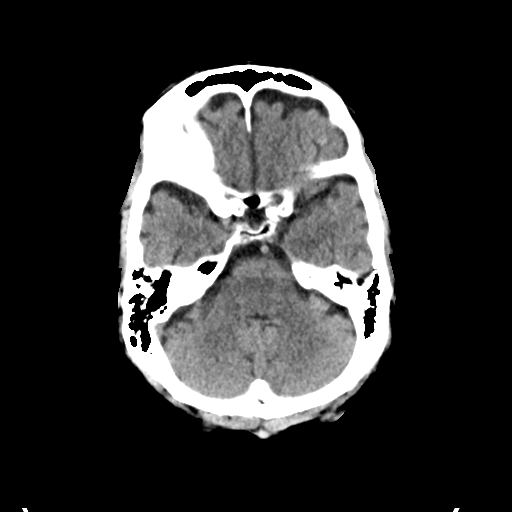
[im 10/31  brain]
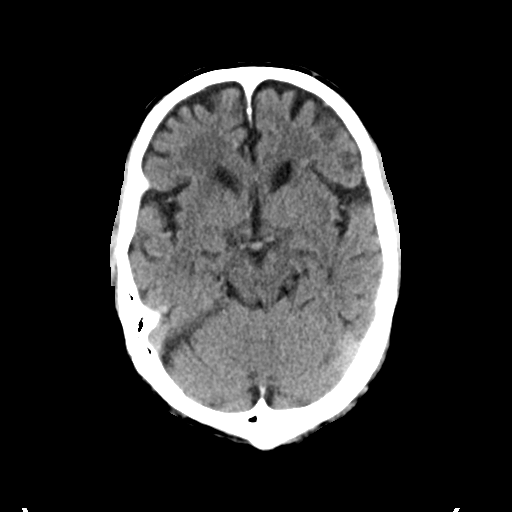
[im 14/31  brain]
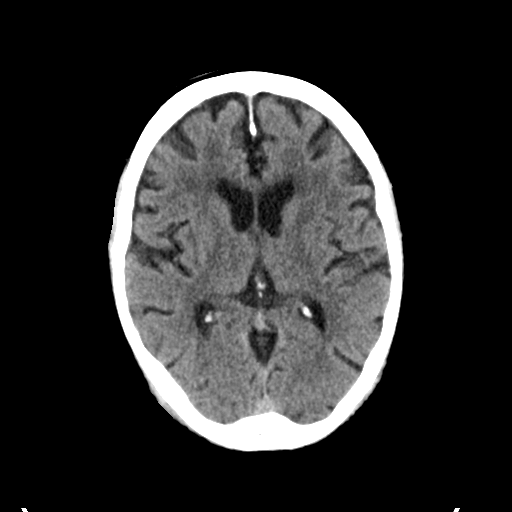
[im 17/31  brain]
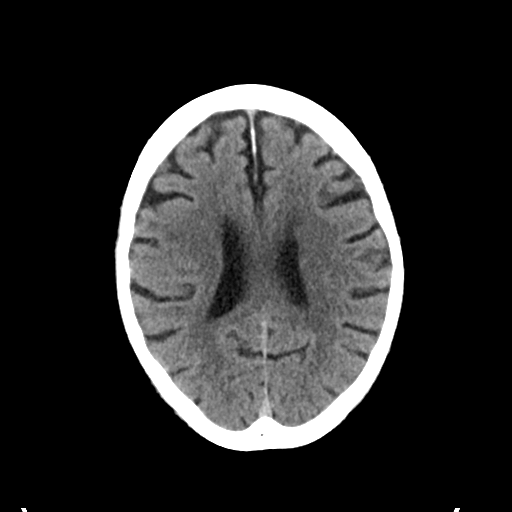
[im 17/31  bone]
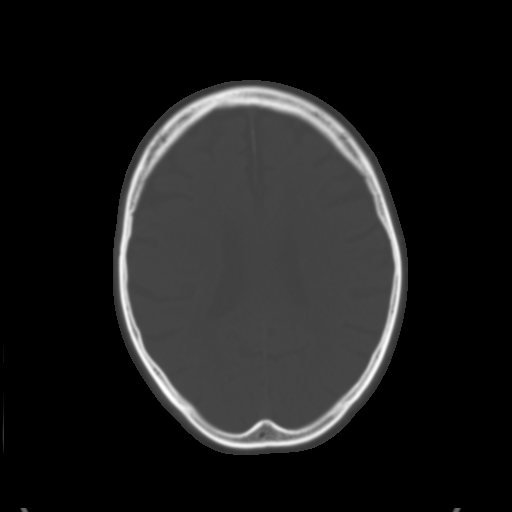
[im 21/31  brain]
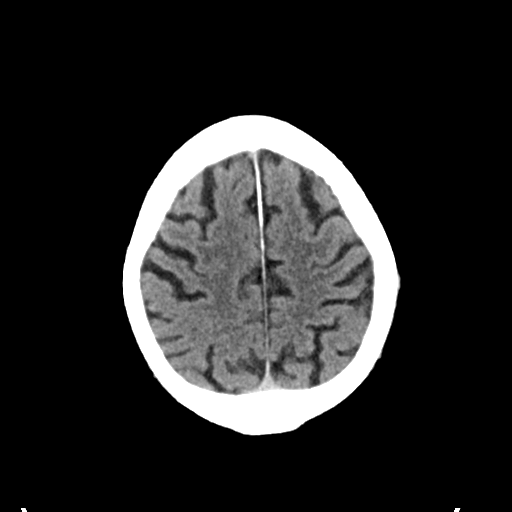
[im 24/31  brain]
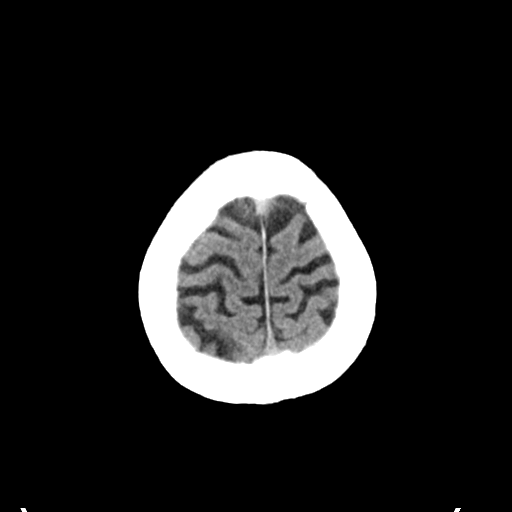
[im 28/31  brain]
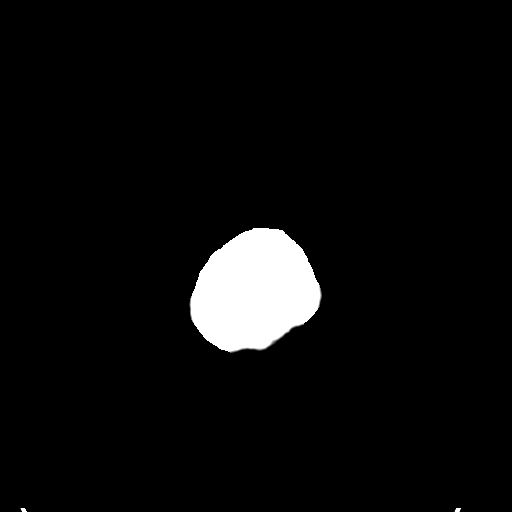

[Series 5: head 3.0 mpr cor · coronal · 0.30mm/px · 3 of 67 slices shown]
[im 23/67  brain]
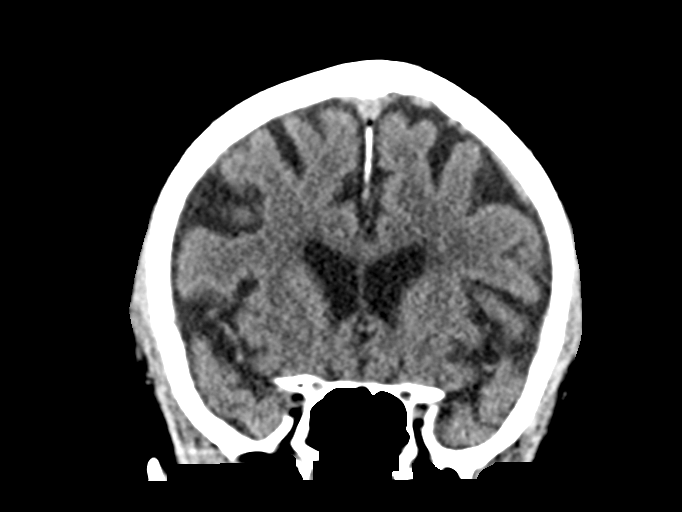
[im 30/67  brain]
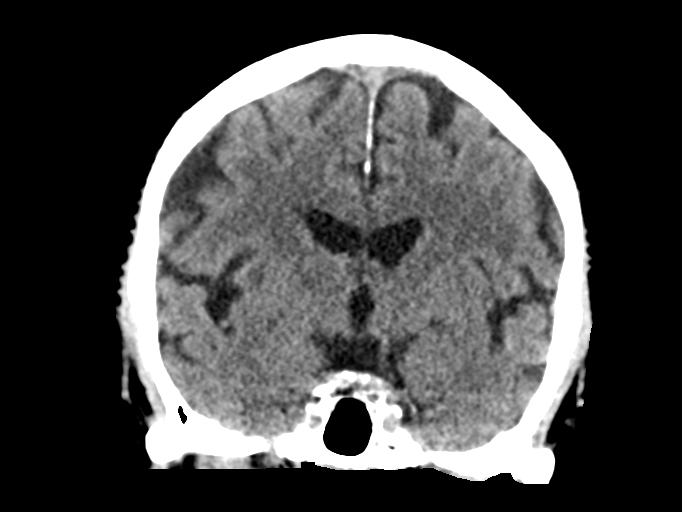
[im 37/67  brain]
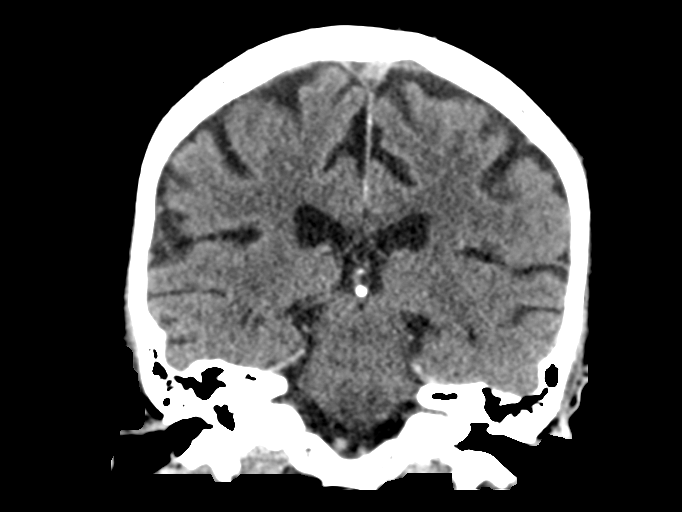

[Series 6: head 3.0 mpr sag · sagittal · 0.31mm/px · 3 of 67 slices shown]
[im 23/67  brain]
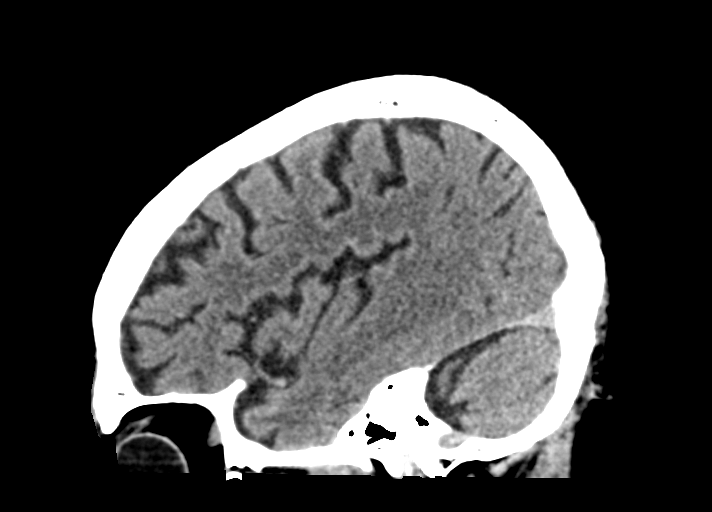
[im 34/67  brain]
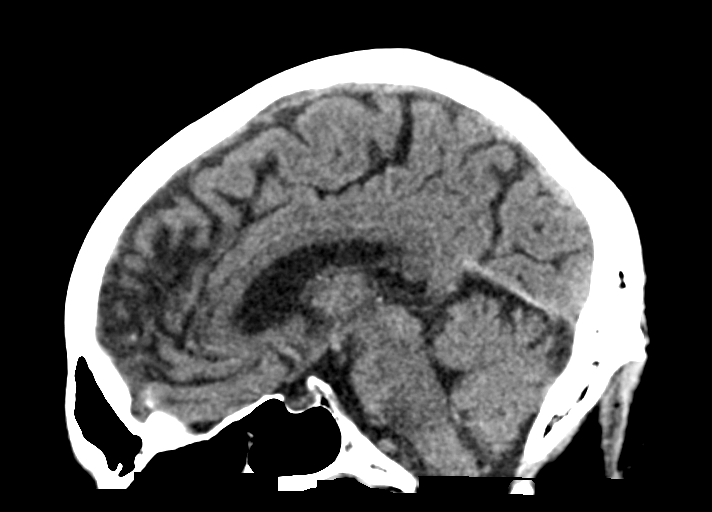
[im 45/67  brain]
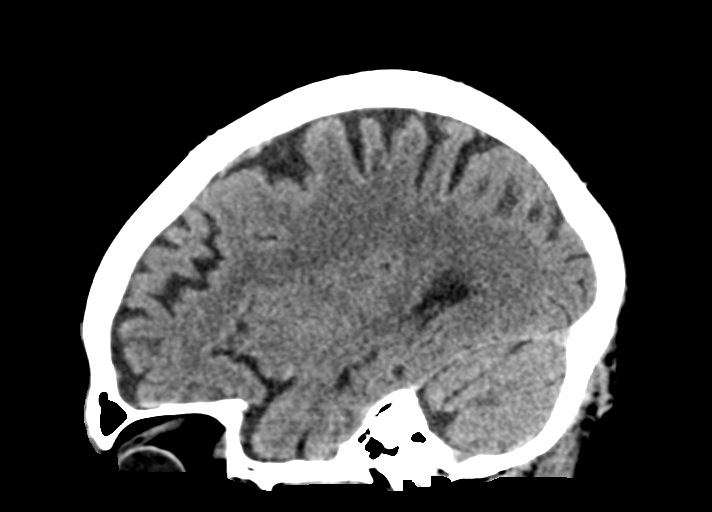

[14 of 47 positions shown; findings below may reference images not displayed]

FINDINGS: Brain: No evidence for acute infarction, hemorrhage, mass lesion,
hydrocephalus, or extra-axial fluid. Mild atrophy, not unexpected
for age. Slight hypoattenuation of white matter favored to represent
small vessel disease.

Vascular: Advanced vascular calcification in the carotid siphons.
This also involves the distal vertebral arteries. No signs of large
vessel occlusion.

Skull: Normal. Negative for fracture or focal lesion.

Sinuses/Orbits: Mild mucosal thickening in the posterior ethmoids.
No layering fluid. No orbital findings.

Other: No significant scalp hematoma or foreign body.
IMPRESSION: Mild atrophy and small vessel disease. No acute intracranial
findings.

Specifically, no skull fracture or intracranial hemorrhage.

No layering sinus fluid or significant scalp hematoma.

## 2018-07-01 ENCOUNTER — Other Ambulatory Visit: Payer: Self-pay

## 2018-07-01 ENCOUNTER — Emergency Department (HOSPITAL_COMMUNITY): Payer: Medicare Other

## 2018-07-01 ENCOUNTER — Inpatient Hospital Stay (HOSPITAL_COMMUNITY): Payer: Medicare Other

## 2018-07-01 ENCOUNTER — Encounter (HOSPITAL_COMMUNITY): Payer: Self-pay | Admitting: Emergency Medicine

## 2018-07-01 ENCOUNTER — Observation Stay (HOSPITAL_COMMUNITY)
Admission: EM | Admit: 2018-07-01 | Discharge: 2018-07-03 | Disposition: A | Discharge status: Home / Self Care | Payer: Medicare Other | Attending: Internal Medicine | Admitting: Internal Medicine

## 2018-07-01 DIAGNOSIS — Z23 Encounter for immunization: Secondary | ICD-10-CM | POA: Insufficient documentation

## 2018-07-01 DIAGNOSIS — R918 Other nonspecific abnormal finding of lung field: Secondary | ICD-10-CM | POA: Diagnosis not present

## 2018-07-01 DIAGNOSIS — I493 Ventricular premature depolarization: Secondary | ICD-10-CM | POA: Diagnosis not present

## 2018-07-01 DIAGNOSIS — I472 Ventricular tachycardia: Secondary | ICD-10-CM | POA: Diagnosis not present

## 2018-07-01 DIAGNOSIS — Z881 Allergy status to other antibiotic agents status: Secondary | ICD-10-CM | POA: Diagnosis not present

## 2018-07-01 DIAGNOSIS — E876 Hypokalemia: Secondary | ICD-10-CM | POA: Diagnosis present

## 2018-07-01 DIAGNOSIS — I5033 Acute on chronic diastolic (congestive) heart failure: Secondary | ICD-10-CM | POA: Diagnosis present

## 2018-07-01 DIAGNOSIS — N289 Disorder of kidney and ureter, unspecified: Secondary | ICD-10-CM

## 2018-07-01 DIAGNOSIS — F1410 Cocaine abuse, uncomplicated: Secondary | ICD-10-CM | POA: Insufficient documentation

## 2018-07-01 DIAGNOSIS — I130 Hypertensive heart and chronic kidney disease with heart failure and stage 1 through stage 4 chronic kidney disease, or unspecified chronic kidney disease: Secondary | ICD-10-CM | POA: Diagnosis not present

## 2018-07-01 DIAGNOSIS — I429 Cardiomyopathy, unspecified: Secondary | ICD-10-CM | POA: Insufficient documentation

## 2018-07-01 DIAGNOSIS — R9431 Abnormal electrocardiogram [ECG] [EKG]: Secondary | ICD-10-CM | POA: Insufficient documentation

## 2018-07-01 DIAGNOSIS — N182 Chronic kidney disease, stage 2 (mild): Secondary | ICD-10-CM | POA: Diagnosis not present

## 2018-07-01 DIAGNOSIS — Z8249 Family history of ischemic heart disease and other diseases of the circulatory system: Secondary | ICD-10-CM | POA: Diagnosis not present

## 2018-07-01 DIAGNOSIS — Z9889 Other specified postprocedural states: Secondary | ICD-10-CM | POA: Insufficient documentation

## 2018-07-01 DIAGNOSIS — J9601 Acute respiratory failure with hypoxia: Secondary | ICD-10-CM | POA: Diagnosis present

## 2018-07-01 DIAGNOSIS — F1010 Alcohol abuse, uncomplicated: Secondary | ICD-10-CM | POA: Diagnosis not present

## 2018-07-01 DIAGNOSIS — F1910 Other psychoactive substance abuse, uncomplicated: Secondary | ICD-10-CM | POA: Diagnosis present

## 2018-07-01 DIAGNOSIS — Z87828 Personal history of other (healed) physical injury and trauma: Secondary | ICD-10-CM | POA: Diagnosis not present

## 2018-07-01 DIAGNOSIS — Z955 Presence of coronary angioplasty implant and graft: Secondary | ICD-10-CM | POA: Insufficient documentation

## 2018-07-01 DIAGNOSIS — M1990 Unspecified osteoarthritis, unspecified site: Secondary | ICD-10-CM | POA: Insufficient documentation

## 2018-07-01 DIAGNOSIS — N179 Acute kidney failure, unspecified: Secondary | ICD-10-CM | POA: Diagnosis not present

## 2018-07-01 DIAGNOSIS — I509 Heart failure, unspecified: Secondary | ICD-10-CM

## 2018-07-01 DIAGNOSIS — I10 Essential (primary) hypertension: Secondary | ICD-10-CM | POA: Diagnosis present

## 2018-07-01 LAB — CBC WITH DIFFERENTIAL/PLATELET
ABS IMMATURE GRANULOCYTES: 0 10*3/uL (ref 0.0–0.1)
Basophils Absolute: 0 10*3/uL (ref 0.0–0.1)
Basophils Relative: 1 %
EOS ABS: 0.1 10*3/uL (ref 0.0–0.7)
Eosinophils Relative: 2 %
HEMATOCRIT: 48.7 % (ref 39.0–52.0)
HEMOGLOBIN: 15.6 g/dL (ref 13.0–17.0)
Immature Granulocytes: 0 %
LYMPHS ABS: 2.2 10*3/uL (ref 0.7–4.0)
LYMPHS PCT: 34 %
MCH: 28.8 pg (ref 26.0–34.0)
MCHC: 32 g/dL (ref 30.0–36.0)
MCV: 89.9 fL (ref 78.0–100.0)
MONOS PCT: 10 %
Monocytes Absolute: 0.6 10*3/uL (ref 0.1–1.0)
NEUTROS PCT: 55 %
Neutro Abs: 3.5 10*3/uL (ref 1.7–7.7)
Platelets: 164 10*3/uL (ref 150–400)
RBC: 5.42 MIL/uL (ref 4.22–5.81)
RDW: 14.6 % (ref 11.5–15.5)
WBC: 6.4 10*3/uL (ref 4.0–10.5)

## 2018-07-01 LAB — COMPREHENSIVE METABOLIC PANEL
ALBUMIN: 3.1 g/dL — AB (ref 3.5–5.0)
ALK PHOS: 70 U/L (ref 38–126)
ALT: 27 U/L (ref 0–44)
AST: 44 U/L — AB (ref 15–41)
Anion gap: 10 (ref 5–15)
BILIRUBIN TOTAL: 0.8 mg/dL (ref 0.3–1.2)
BUN: 12 mg/dL (ref 8–23)
CO2: 20 mmol/L — ABNORMAL LOW (ref 22–32)
CREATININE: 1.36 mg/dL — AB (ref 0.61–1.24)
Calcium: 8.4 mg/dL — ABNORMAL LOW (ref 8.9–10.3)
Chloride: 108 mmol/L (ref 98–111)
GFR calc Af Amer: 55 mL/min — ABNORMAL LOW (ref >60)
GFR calc non Af Amer: 48 mL/min — ABNORMAL LOW (ref >60)
GLUCOSE: 160 mg/dL — AB (ref 70–99)
Potassium: 3.2 mmol/L — ABNORMAL LOW (ref 3.5–5.1)
Sodium: 138 mmol/L (ref 135–145)
TOTAL PROTEIN: 7.1 g/dL (ref 6.5–8.1)

## 2018-07-01 LAB — RAPID URINE DRUG SCREEN, HOSP PERFORMED
Amphetamines: NOT DETECTED
Benzodiazepines: NOT DETECTED
COCAINE: NOT DETECTED
OPIATES: NOT DETECTED
Tetrahydrocannabinol: NOT DETECTED

## 2018-07-01 LAB — BRAIN NATRIURETIC PEPTIDE: B NATRIURETIC PEPTIDE 5: 1510.8 pg/mL — AB (ref 0.0–100.0)

## 2018-07-01 LAB — TROPONIN I
Troponin I: 0.06 ng/mL (ref <0.03)
Troponin I: 0.08 ng/mL (ref <0.03)
Troponin I: 0.08 ng/mL (ref <0.03)

## 2018-07-01 LAB — URINALYSIS, ROUTINE W REFLEX MICROSCOPIC
BILIRUBIN URINE: NEGATIVE
GLUCOSE, UA: NEGATIVE mg/dL
KETONES UR: NEGATIVE mg/dL
LEUKOCYTES UA: NEGATIVE
NITRITE: NEGATIVE
Protein, ur: 30 mg/dL — AB
Specific Gravity, Urine: 1.005 (ref 1.005–1.030)
pH: 5 (ref 5.0–8.0)

## 2018-07-01 LAB — I-STAT TROPONIN, ED: Troponin i, poc: 0.03 ng/mL (ref 0.00–0.08)

## 2018-07-01 LAB — LIPASE, BLOOD: LIPASE: 49 U/L (ref 11–51)

## 2018-07-01 LAB — MAGNESIUM
MAGNESIUM: 1.7 mg/dL (ref 1.7–2.4)
MAGNESIUM: 1.8 mg/dL (ref 1.7–2.4)

## 2018-07-01 LAB — POTASSIUM: POTASSIUM: 3.7 mmol/L (ref 3.5–5.1)

## 2018-07-01 MED ORDER — LORAZEPAM 2 MG/ML IJ SOLN: 1.0000 mg | Freq: Four times a day (QID) | INTRAMUSCULAR | Status: DC | PRN

## 2018-07-01 MED ORDER — SODIUM CHLORIDE 0.9% FLUSH
3.0000 mL | Freq: Two times a day (BID) | INTRAVENOUS | Status: DC
  Administered 2018-07-01 – 2018-07-03 (×5): 3 mL via INTRAVENOUS

## 2018-07-01 MED ORDER — POTASSIUM CHLORIDE CRYS ER 20 MEQ PO TBCR
20.0000 meq | EXTENDED_RELEASE_TABLET | Freq: Two times a day (BID) | ORAL | Status: DC
  Administered 2018-07-01 – 2018-07-02 (×3): 20 meq via ORAL
  Filled 2018-07-01 (×3): qty 1

## 2018-07-01 MED ORDER — VITAMIN B-1 100 MG PO TABS
100.0000 mg | ORAL_TABLET | Freq: Every day | ORAL | Status: DC
  Administered 2018-07-01 – 2018-07-03 (×3): 100 mg via ORAL
  Filled 2018-07-01 (×3): qty 1

## 2018-07-01 MED ORDER — PNEUMOCOCCAL VAC POLYVALENT 25 MCG/0.5ML IJ INJ
0.5000 mL | INJECTION | INTRAMUSCULAR | Status: AC
  Administered 2018-07-03: 0.5 mL via INTRAMUSCULAR
  Filled 2018-07-01: qty 0.5

## 2018-07-01 MED ORDER — SODIUM CHLORIDE 0.9% FLUSH
3.0000 mL | INTRAVENOUS | Status: DC | PRN
Start: 2018-07-01 — End: 2018-07-03

## 2018-07-01 MED ORDER — FUROSEMIDE 10 MG/ML IJ SOLN
40.0000 mg | Freq: Two times a day (BID) | INTRAMUSCULAR | Status: DC
  Administered 2018-07-01 – 2018-07-03 (×4): 40 mg via INTRAVENOUS
  Filled 2018-07-01 (×4): qty 4

## 2018-07-01 MED ORDER — ISOSORBIDE MONONITRATE ER 30 MG PO TB24
15.0000 mg | ORAL_TABLET | Freq: Every day | ORAL | Status: DC
  Administered 2018-07-01 – 2018-07-03 (×3): 15 mg via ORAL
  Filled 2018-07-01 (×3): qty 1

## 2018-07-01 MED ORDER — ONDANSETRON HCL 4 MG/2ML IJ SOLN: 4.0000 mg | Freq: Four times a day (QID) | INTRAMUSCULAR | Status: DC | PRN

## 2018-07-01 MED ORDER — FOLIC ACID 1 MG PO TABS
1.0000 mg | ORAL_TABLET | Freq: Every day | ORAL | Status: DC
  Administered 2018-07-01 – 2018-07-03 (×3): 1 mg via ORAL
  Filled 2018-07-01 (×3): qty 1

## 2018-07-01 MED ORDER — THIAMINE HCL 100 MG/ML IJ SOLN
100.0000 mg | Freq: Every day | INTRAMUSCULAR | Status: DC
  Filled 2018-07-01: qty 2

## 2018-07-01 MED ORDER — SODIUM CHLORIDE 0.9 % IV SOLN: 250.0000 mL | INTRAVENOUS | Status: DC | PRN

## 2018-07-01 MED ORDER — ADULT MULTIVITAMIN W/MINERALS CH
1.0000 | ORAL_TABLET | Freq: Every day | ORAL | Status: DC
  Administered 2018-07-01 – 2018-07-03 (×3): 1 via ORAL
  Filled 2018-07-01 (×3): qty 1

## 2018-07-01 MED ORDER — ALBUTEROL SULFATE (2.5 MG/3ML) 0.083% IN NEBU: 2.5000 mg | INHALATION_SOLUTION | Freq: Four times a day (QID) | RESPIRATORY_TRACT | Status: DC | PRN

## 2018-07-01 MED ORDER — ACETAMINOPHEN 325 MG PO TABS: 650.0000 mg | ORAL_TABLET | ORAL | Status: DC | PRN

## 2018-07-01 MED ORDER — ASPIRIN EC 81 MG PO TBEC
81.0000 mg | DELAYED_RELEASE_TABLET | Freq: Every day | ORAL | Status: DC
  Administered 2018-07-01 – 2018-07-03 (×3): 81 mg via ORAL
  Filled 2018-07-01 (×3): qty 1

## 2018-07-01 MED ORDER — LORAZEPAM 1 MG PO TABS: 1.0000 mg | ORAL_TABLET | Freq: Four times a day (QID) | ORAL | Status: DC | PRN

## 2018-07-01 MED ORDER — ENOXAPARIN SODIUM 40 MG/0.4ML ~~LOC~~ SOLN
40.0000 mg | SUBCUTANEOUS | Status: DC
  Administered 2018-07-01 – 2018-07-03 (×3): 40 mg via SUBCUTANEOUS
  Filled 2018-07-01 (×3): qty 0.4

## 2018-07-01 MED ORDER — FUROSEMIDE 10 MG/ML IJ SOLN
40.0000 mg | Freq: Once | INTRAMUSCULAR | Status: AC
Start: 2018-07-01 — End: 2018-07-01
  Administered 2018-07-01: 40 mg via INTRAVENOUS
  Filled 2018-07-01: qty 4
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
## 2018-07-01 NOTE — ED Notes (Signed)
Admitting MD at bedside.

## 2018-07-01 NOTE — ED Notes (Signed)
[facility] label printed for BNP and sent down to lab.
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
## 2018-07-01 NOTE — ED Notes (Signed)
Lab called regarding BNP results not back. Lab states will run now.

## 2018-07-01 NOTE — Progress Notes (Signed)
Patient had 8 beats of VT, followed again with 4. Patient asymptomatic, VS stable. Paged NP Baltazar Najjar on call and notified.  Akina Maish, RN

## 2018-07-01 NOTE — ED Triage Notes (Signed)
Per EMS: pt from home with c/o flu-like symptoms including N/V and shob x 1 week.  Pt also notes increased bilateral lower extremity swelling and weight gain.  Pt 89% on RA, EMS placed pt on 3L [place], pt Sp02 increased to 96%.  Pt also admits to recent cocaine and alcohol use.

## 2018-07-01 NOTE — Progress Notes (Signed)
Labs Magnesium and Potassium ordered.  Nicole Defino, RN

## 2018-07-01 NOTE — ED Notes (Signed)
X-ray at bedside

## 2018-07-01 NOTE — Progress Notes (Signed)
Patient admitted to unit from ED alert and oriented. Pt transferred independently from stretcher to bed. 02 via [place] at 2/L altho patient keeps taking it off and on.    Skin intact.   Patient demonstrates no s/s/x of distress. Respirations even and non labored.  Left lower leg/ankle more edematous than right. Pt declines bed alarm to be set on bed stating" I am going to get up". "I don't want an alarm".

## 2018-07-01 NOTE — Progress Notes (Signed)
Patient is up ambulating in room independently. Gait steady.

## 2018-07-01 NOTE — Progress Notes (Signed)
Magnesium and potassium labs WNL.  Mikele Sifuentes, RN

## 2018-07-01 NOTE — ED Notes (Signed)
ED TO INPATIENT HANDOFF REPORT  Name/Age/Gender Billy Parker 82 y.o. male  Code Status    Code Status Orders  (From admission, onward)        Start     Ordered   07/01/18 1013  Full code  Continuous     07/01/18 1014    Code Status History    Date Active Date Inactive Code Status Order ID Comments User Context   05/10/2017 1450 05/14/2017 1419 Full Code [id number]  Radene Gunning, NP ED   09/18/2015 1750 09/19/2015 1517 Full Code [id number]  Hosie Poisson, MD Inpatient   05/19/2015 1224 05/23/2015 1526 Full Code [id number]  Karen Kitchens Inpatient   06/11/2014 1823 06/12/2014 1838 Full Code [id number]  Roger Shelter, RN Inpatient   04/26/2014 1447 04/29/2014 1452 Full Code [id number]  Cristal Ford, DO ED   01/03/2014 0823 01/06/2014 1525 Full Code [id number]  Jonetta Osgood, MD Inpatient   07/06/2013 1739 July 30, 2013 1542 Full Code [id number]  Annita Brod, MD Inpatient      Home/SNF/Other Home  Chief Complaint sob  Level of Care/Admitting Diagnosis ED Disposition    ED Disposition Condition [place]: [facility] [[id number]]  Level of Care: Telemetry [5]  Diagnosis: Acute on chronic diastolic heart failure ([place]) [428.33.ICD-9-CM]  Admitting Physician: Karmen Bongo [[id number]]  Attending Physician: Karmen Bongo [[id number]]  Estimated length of stay: 3 - 4 days  Certification:: I certify this patient will need inpatient services for at least 2 midnights  PT Class (Do Not Modify): Inpatient [101]  PT Acc Code (Do Not Modify): Private [1]       Medical History Past Medical History:  Diagnosis Date  . Abnormal nuclear stress test, 07/07/13 large scar no ischemia 07/08/2013  . Arthritis    "bad in my back & in my right forearm" (07/06/2013)  . At risk for sudden cardiac death 2013-07-30  . Chronic lower back pain   . Cocaine abuse ([place])   . ETOH abuse   . Hypertension   . Hypokalemia 07/07/2013  . NICM (nonischemic cardiomyopathy)  ([place]) 07/08/2013  . NSVT (nonsustained ventricular tachycardia) ([place]) 07/09/2013  . PVC's (premature ventricular contractions)     Allergies Allergies  Allergen Reactions  . Other Swelling    Antibiotic (name not recalled by the patient) = both legs became swollen    IV Location/Drains/Wounds Patient Lines/Drains/Airways Status   Active Line/Drains/Airways    Name:   Placement date:   Placement time:   Site:   Days:   Peripheral IV 07/01/18 Left Wrist   07/01/18    0726    Wrist   less than 1          Labs/Imaging Results for orders placed or performed during the hospital encounter of 07/01/18 (from the past 48 hour(s))  CBC with Differential/Platelet     Status: None   Collection Time: 07/01/18  7:24 AM  Result Value Ref Range   WBC 6.4 4.0 - 10.5 K/uL   RBC 5.42 4.22 - 5.81 MIL/uL   Hemoglobin 15.6 13.0 - 17.0 g/dL   HCT 48.7 39.0 - 52.0 %   MCV 89.9 78.0 - 100.0 fL   MCH 28.8 26.0 - 34.0 pg   MCHC 32.0 30.0 - 36.0 g/dL   RDW 14.6 11.5 - 15.5 %   Platelets 164 150 - 400 K/uL   Neutrophils Relative % 55 %   Neutro Abs 3.5 1.7 - 7.7 K/uL  Lymphocytes Relative 34 %   Lymphs Abs 2.2 0.7 - 4.0 K/uL   Monocytes Relative 10 %   Monocytes Absolute 0.6 0.1 - 1.0 K/uL   Eosinophils Relative 2 %   Eosinophils Absolute 0.1 0.0 - 0.7 K/uL   Basophils Relative 1 %   Basophils Absolute 0.0 0.0 - 0.1 K/uL   Immature Granulocytes 0 %   Abs Immature Granulocytes 0.0 0.0 - 0.1 K/uL    Comment: Performed at [facility] [address], [place], [place] [address]  Comprehensive metabolic panel     Status: Abnormal   Collection Time: 07/01/18  7:24 AM  Result Value Ref Range   Sodium 138 135 - 145 mmol/L   Potassium 3.2 (L) 3.5 - 5.1 mmol/L   Chloride 108 98 - 111 mmol/L    Comment: Please note change in reference range.   CO2 20 (L) 22 - 32 mmol/L   Glucose, Bld 160 (H) 70 - 99 mg/dL    Comment: Please note change in reference range.   BUN 12 8 - 23 mg/dL    Comment:  Please note change in reference range.   Creatinine, Ser 1.36 (H) 0.61 - 1.24 mg/dL   Calcium 8.4 (L) 8.9 - 10.3 mg/dL   Total Protein 7.1 6.5 - 8.1 g/dL   Albumin 3.1 (L) 3.5 - 5.0 g/dL   AST 44 (H) 15 - 41 U/L   ALT 27 0 - 44 U/L    Comment: Please note change in reference range.   Alkaline Phosphatase 70 38 - 126 U/L   Total Bilirubin 0.8 0.3 - 1.2 mg/dL   GFR calc non Af Amer 48 (L) >60 mL/min   GFR calc Af Amer 55 (L) >60 mL/min    Comment: (NOTE) The eGFR has been calculated using the CKD EPI equation. This calculation has not been validated in all clinical situations. eGFR's persistently <60 mL/min signify possible Chronic Kidney Disease.    Anion gap 10 5 - 15    Comment: Performed at [facility] [address], [place], [place] [address]  Lipase, blood     Status: None   Collection Time: 07/01/18  7:24 AM  Result Value Ref Range   Lipase 49 11 - 51 U/L    Comment: Performed at [facility] [address], [place], [place] [address]  Brain natriuretic peptide     Status: Abnormal   Collection Time: 07/01/18  7:24 AM  Result Value Ref Range   B Natriuretic Peptide 1,510.8 (H) 0.0 - 100.0 pg/mL    Comment: Performed at [facility] [address] [place],  [address]  I-stat troponin, ED     Status: None   Collection Time: 07/01/18  7:39 AM  Result Value Ref Range   Troponin i, poc 0.03 0.00 - 0.08 ng/mL   Comment 3            Comment: Due to the release kinetics of cTnI, a negative result within the first hours of the onset of symptoms does not rule out myocardial infarction with certainty. If myocardial infarction is still suspected, repeat the test at appropriate intervals.   Magnesium     Status: None   Collection Time: 07/01/18 10:00 AM  Result Value Ref Range   Magnesium 1.7 1.7 - 2.4 mg/dL    Comment: Performed at [facility], [place] [address] [place], [place] [address]  Troponin I (q 6hr x 3)  Status: Abnormal    Collection Time: 07/01/18 10:00 AM  Result Value Ref Range   Troponin I 0.06 (HH) <0.03 ng/mL    Comment: CRITICAL RESULT CALLED TO, READ BACK BY AND VERIFIED WITH: C.RAND,RN @ 1113 07/01/2018 [place] Performed at [facility], [place] [address] [place], [place] [address]   Urine rapid drug screen (hosp performed)     Status: Abnormal   Collection Time: 07/01/18 10:02 AM  Result Value Ref Range   Opiates NONE DETECTED NONE DETECTED   Cocaine NONE DETECTED NONE DETECTED   Benzodiazepines NONE DETECTED NONE DETECTED   Amphetamines NONE DETECTED NONE DETECTED   Tetrahydrocannabinol NONE DETECTED NONE DETECTED   Barbiturates (A) NONE DETECTED    Result not available. Reagent lot number recalled by manufacturer.    Comment: Performed at [facility], [place] [address] [place], [place] [address]  Urinalysis, Routine w reflex microscopic     Status: Abnormal   Collection Time: 07/01/18 10:02 AM  Result Value Ref Range   Color, Urine STRAW (A) YELLOW   APPearance CLEAR CLEAR   Specific Gravity, Urine 1.005 1.005 - 1.030   pH 5.0 5.0 - 8.0   Glucose, UA NEGATIVE NEGATIVE mg/dL   Hgb urine dipstick SMALL (A) NEGATIVE   Bilirubin Urine NEGATIVE NEGATIVE   Ketones, ur NEGATIVE NEGATIVE mg/dL   Protein, ur 30 (A) NEGATIVE mg/dL   Nitrite NEGATIVE NEGATIVE   Leukocytes, UA NEGATIVE NEGATIVE   RBC / HPF 0-5 0 - 5 RBC/hpf   WBC, UA 0-5 0 - 5 WBC/hpf   Bacteria, UA RARE (A) NONE SEEN   Squamous Epithelial / LPF 0-5 0 - 5   Mucus PRESENT     Comment: Performed at [facility], [place] [address] [place], [place] [address]   Dg Chest Port 1 View  Result Date: 07/01/2018 CLINICAL DATA:  Nausea and vomiting. Shortness of breath over the last week. EXAM: PORTABLE CHEST 1 VIEW COMPARISON:  05/10/2017 FINDINGS: Heart size upper limits of normal. Aortic atherosclerosis is noted. No consolidation or lobar collapse. Question small areas of patchy density both lungs that could go along with mild  patchy pneumonia or aspiration. No effusions. Bony structures are unremarkable. IMPRESSION: No definite active disease. Question few areas of patchy density that could go along with mild pneumonia or aspiration. Electronically Signed   By: Nelson Chimes M.D.   On: 07/01/2018 07:38    Pending Labs Unresulted Labs (From admission, onward)   Start     Ordered   07/02/18 0500  Lipid panel  Tomorrow morning,   R     07/01/18 1006   07/02/18 4403  Basic metabolic panel  Daily,   R     07/01/18 1014   07/01/18 1000  Troponin I (q 6hr x 3)  Now then every 6 hours,   R     07/01/18 0959      Vitals/Pain Today's Vitals   07/01/18 1230 07/01/18 1300 07/01/18 1330 07/01/18 1400  BP: 125/86 129/89 (!) 145/84 (!) 123/92  Pulse: 85 73 64 85  Resp: 20 (!) 28 20 (!) 31  Temp:      TempSrc:      SpO2: 91% 93% 95% 91%  Weight:      Height:      PainSc:        Isolation Precautions No active isolations  Medications Medications  potassium chloride SA (K-DUR,KLOR-CON) CR tablet 20 mEq (20 mEq Oral Given 07/01/18 1112)  albuterol (PROVENTIL) (2.5 MG/3ML) 0.083% nebulizer solution 2.5 mg (has no administration in time range)  aspirin EC tablet 81 mg (81 mg Oral Given 07/01/18 1112)  isosorbide mononitrate (IMDUR) 24 hr tablet 15 mg (15 mg Oral Given 07/01/18 1114)  LORazepam (ATIVAN) tablet 1 mg (has no administration in time range)    Or  LORazepam (ATIVAN) injection 1 mg (has no administration in time range)  thiamine (VITAMIN B-1) tablet 100 mg (100 mg Oral Given 07/01/18 1112)    Or  thiamine (B-1) injection 100 mg ( Intravenous See Alternative 09/02/80 0175)  folic acid (FOLVITE) tablet 1 mg (1 mg Oral Given 07/01/18 1111)  multivitamin with minerals tablet 1 tablet (1 tablet Oral Given 07/01/18 1113)  sodium chloride flush (NS) 0.9 % injection 3 mL (3 mLs Intravenous Given 07/01/18 1114)  sodium chloride flush (NS) 0.9 % injection 3 mL (has no administration in time range)  0.9 %  sodium chloride  infusion (has no administration in time range)  acetaminophen (TYLENOL) tablet 650 mg (has no administration in time range)  ondansetron (ZOFRAN) injection 4 mg (has no administration in time range)  enoxaparin (LOVENOX) injection 40 mg (40 mg Subcutaneous Given 07/01/18 1235)  furosemide (LASIX) injection 40 mg (has no administration in time range)  furosemide (LASIX) injection 40 mg (40 mg Intravenous Given 07/01/18 0843)    Mobility walks

## 2018-07-01 NOTE — Progress Notes (Signed)
Pt educated about safety and importance of bed alarm during the night however pt refuses to be on bed alarm during the night. Will continue to round on patient.   Aleeyah Bensen, RN

## 2018-07-02 ENCOUNTER — Inpatient Hospital Stay (HOSPITAL_BASED_OUTPATIENT_CLINIC_OR_DEPARTMENT_OTHER): Payer: Medicare Other

## 2018-07-02 DIAGNOSIS — I10 Essential (primary) hypertension: Secondary | ICD-10-CM | POA: Diagnosis not present

## 2018-07-02 DIAGNOSIS — I5033 Acute on chronic diastolic (congestive) heart failure: Secondary | ICD-10-CM | POA: Diagnosis not present

## 2018-07-02 DIAGNOSIS — I340 Nonrheumatic mitral (valve) insufficiency: Secondary | ICD-10-CM | POA: Diagnosis not present

## 2018-07-02 DIAGNOSIS — F1910 Other psychoactive substance abuse, uncomplicated: Secondary | ICD-10-CM

## 2018-07-02 DIAGNOSIS — E876 Hypokalemia: Secondary | ICD-10-CM | POA: Diagnosis not present

## 2018-07-02 DIAGNOSIS — J9601 Acute respiratory failure with hypoxia: Secondary | ICD-10-CM

## 2018-07-02 DIAGNOSIS — I351 Nonrheumatic aortic (valve) insufficiency: Secondary | ICD-10-CM | POA: Diagnosis not present

## 2018-07-02 LAB — LIPID PANEL
Cholesterol: 142 mg/dL (ref 0–200)
HDL: 52 mg/dL
LDL Cholesterol: 75 mg/dL (ref 0–99)
Total CHOL/HDL Ratio: 2.7 ratio
Triglycerides: 77 mg/dL
VLDL: 15 mg/dL (ref 0–40)

## 2018-07-02 LAB — BASIC METABOLIC PANEL
ANION GAP: 8 (ref 5–15)
BUN: 16 mg/dL (ref 8–23)
CO2: 24 mmol/L (ref 22–32)
Calcium: 8.7 mg/dL — ABNORMAL LOW (ref 8.9–10.3)
Chloride: 109 mmol/L (ref 98–111)
Creatinine, Ser: 1.34 mg/dL — ABNORMAL HIGH (ref 0.61–1.24)
GFR calc Af Amer: 56 mL/min — ABNORMAL LOW (ref >60)
GFR, EST NON AFRICAN AMERICAN: 49 mL/min — AB (ref >60)
GLUCOSE: 107 mg/dL — AB (ref 70–99)
Potassium: 3.7 mmol/L (ref 3.5–5.1)
Sodium: 141 mmol/L (ref 135–145)

## 2018-07-02 LAB — ECHOCARDIOGRAM COMPLETE
Height: 66 in
WEIGHTICAEL: 2742.4 [oz_av]

## 2018-07-02 MED ORDER — LISINOPRIL 5 MG PO TABS
2.5000 mg | ORAL_TABLET | Freq: Every day | ORAL | Status: DC
  Administered 2018-07-02 – 2018-07-03 (×2): 2.5 mg via ORAL
  Filled 2018-07-02 (×2): qty 1

## 2018-07-02 MED ORDER — POTASSIUM CHLORIDE CRYS ER 20 MEQ PO TBCR
20.0000 meq | EXTENDED_RELEASE_TABLET | Freq: Every day | ORAL | Status: DC
  Administered 2018-07-03: 20 meq via ORAL
  Filled 2018-07-02: qty 1

## 2018-07-02 NOTE — Care Management Obs Status (Signed)
[facility] NOTIFICATION   Patient Details  Name: Alexandr Yaworski MRN: [id number] Date of Birth: 01-09-1936   Medicare Observation Status Notification Given:  Yes    Royston Bake, RN 07/02/2018, 4:13 PM

## 2018-07-02 NOTE — Progress Notes (Signed)
PROGRESS NOTE    Billy Parker  INO:[id number] DOB: 1936-03-28 DOA: 07/01/2018 PCP: Nolene Ebbs, MD    Brief Narrative:  82 year old male who presented with dyspnea.  He does have a significant past medical history of polysubstance abuse, alcohol consumption, hypertension, and chronic diastolic heart failure.  Patient reported 7 days of worsening dyspnea, associated with weight gain and lower extremity edema, along with weight gain.  Apparently he stopped taking his medications about 2 weeks prior to hospitalization.  Continue cocaine and alcohol abuse.  On his initial physical examination blood pressure 133/98, heart rate 93, temperature 92, respiratory rate 32, oxygen saturation 89% on room air.  Moist mucous membranes, his lungs had rales bilaterally, heart S1-S2 present, irregular, no gallops, rubs or murmurs, the abdomen was soft nontender, positive pitting lower extremity edema +2, bilaterally.  Sodium 138, potassium 3.2, chloride 108, bicarb 20, glucose 160, BUN 12, creatinine 1.36, BNP 1510, white count 6.4, hemoglobin 15.6, hematocrit 48.7, platelets 164.  Urine with specific gravity 1.005, protein 30, white cells 0-5, red cells 0-5.  Chest x-ray with increased interstitial markings bilaterally.  EKG with LVH with multiple PVCs, normal axis.  Patient was admitted to the hospital with the working diagnosis of acute on chronic diastolic heart failure exacerbation, complicated by acute hypoxic respiratory failure due to acute cardiogenic pulmonary edema   Assessment & Plan:   Principal Problem:   Acute on chronic diastolic heart failure (HCC) Active Problems:   Hypertension   Polysubstance abuse (H[facility])   Acute hypokalemia   Acute renal insufficiency   Acute respiratory failure with hypoxia (H[facility])   1. Acute on chronic diastolic heart failure. Patient responding well to IV furosemide 40 mg bid, urine output over last 24 hours 2,000 ml with significant improvement in his symptoms. Will  continue diuresis to further negative fluid balance. Continue isosorbide and will add low dose ace inh, avoid b blocker due to use of cocaine. Echocardiogram from 04/2012 (personally reviewed old records) preserved LV systolic function 09%. Follow on new echocardiogram.   2. HTN. Patient not compliant with medications, will continue isosorbide and will add low dose lisinopril.   3. Stage 2 CKD with hypokalemia. Will continue correction with KCl, renal function with serum cr at 1,34. Continue diuresis with IV furosemide, follow on renal panel in am.    4. Polysubstance abuse. No signs of withdrawal, will continue neuro checks per unit protocol.    DVT prophylaxis: heparin   Code Status:  full Family Communication: no family at the bedside  Disposition Plan:  Home in am if renal function and volume status improve.    Consultants:     Procedures:     Antimicrobials:      Subjective: Patient is feeling better, dyspnea has improved, but no back to baseline, no nausea or vomiting, patient off medications at home.   Objective: Vitals:   07/02/18 0200 07/02/18 0347 07/02/18 0355 07/02/18 0734  BP:  118/90  (!) 148/100  Pulse:  83  93  Resp:  11  14  Temp:  97.7 F (36.5 C)  97.7 F (36.5 C)  TempSrc:  Oral  Oral  SpO2: 97% 98% 98% 96%  Weight:      Height:        Intake/Output Summary (Last 24 hours) at 07/02/2018 0931 Last data filed at 07/02/2018 6283 Gross per 24 hour  Intake 703 ml  Output 2000 ml  Net -1297 ml   Filed Weights   07/01/18 0727 07/01/18 1400  07/02/18 0056  Weight: 77.1 kg (170 lb) 78.5 kg (173 lb 1 oz) 77.7 kg (171 lb 6.4 oz)    Examination:   General: Not in pain or dyspnea.  Neurology: Awake and alert, non focal  E ENT: mild pallor, no icterus, oral mucosa moist Cardiovascular: No JVD. S1-S2 present, rhythmic, no gallops, rubs, or murmurs. Positive +/++ lower extremity edema. Pulmonary: decreased breath sounds bilaterally, adequate air  movement, no wheezing, rhonchi or rales. Gastrointestinal. Abdomen with no organomegaly, non tender, no rebound or guarding Skin. No rashes Musculoskeletal: no joint deformities     Data Reviewed: I have personally reviewed following labs and imaging studies  CBC: Recent Labs  Lab 07/01/18 0724  WBC 6.4  NEUTROABS 3.5  HGB 15.6  HCT 48.7  MCV 89.9  PLT 938   Basic Metabolic Panel: Recent Labs  Lab 07/01/18 0724 07/01/18 1000 07/01/18 2149 07/02/18 0541  NA 138  --   --  141  K 3.2*  --  3.7 3.7  CL 108  --   --  109  CO2 20*  --   --  24  GLUCOSE 160*  --   --  107*  BUN 12  --   --  16  CREATININE 1.36*  --   --  1.34*  CALCIUM 8.4*  --   --  8.7*  MG  --  1.7 1.8  --    GFR: Estimated Creatinine Clearance: 43.9 mL/min (A) (by C-G formula based on SCr of 1.34 mg/dL (H)). Liver Function Tests: Recent Labs  Lab 07/01/18 0724  AST 44*  ALT 27  ALKPHOS 70  BILITOT 0.8  PROT 7.1  ALBUMIN 3.1*   Recent Labs  Lab 07/01/18 0724  LIPASE 49   No results for input(s): AMMONIA in the last 168 hours. Coagulation Profile: No results for input(s): INR, PROTIME in the last 168 hours. Cardiac Enzymes: Recent Labs  Lab 07/01/18 1000 07/01/18 1534 07/01/18 2149  TROPONINI 0.06* 0.08* 0.08*   BNP (last 3 results) No results for input(s): PROBNP in the last 8760 hours. HbA1C: No results for input(s): HGBA1C in the last 72 hours. CBG: No results for input(s): GLUCAP in the last 168 hours. Lipid Profile: Recent Labs    07/02/18 0541  CHOL 142  HDL 52  LDLCALC 75  TRIG 77  CHOLHDL 2.7   Thyroid Function Tests: No results for input(s): TSH, T4TOTAL, FREET4, T3FREE, THYROIDAB in the last 72 hours. Anemia Panel: No results for input(s): VITAMINB12, FOLATE, FERRITIN, TIBC, IRON, RETICCTPCT in the last 72 hours.    Radiology Studies: I have reviewed all of the imaging during this hospital visit personally     Scheduled Meds: . aspirin EC  81 mg  Oral Daily  . enoxaparin (LOVENOX) injection  40 mg Subcutaneous Q24H  . folic acid  1 mg Oral Daily  . furosemide  40 mg Intravenous Q12H  . isosorbide mononitrate  15 mg Oral Daily  . multivitamin with minerals  1 tablet Oral Daily  . pneumococcal 23 valent vaccine  0.5 mL Intramuscular Tomorrow-1000  . potassium chloride  20 mEq Oral BID  . sodium chloride flush  3 mL Intravenous Q12H  . thiamine  100 mg Oral Daily   Or  . thiamine  100 mg Intravenous Daily   Continuous Infusions: . sodium chloride       LOS: 1 day        [place]akleigh Hesketh Gerome Apley, MD [facility] Pager [phone]

## 2018-07-02 NOTE — Care Management Note (Signed)
Case Management Note  Patient Details  Name: Billy Parker MRN: [id number] Date of Birth: October 29, 1936  Subjective/Objective:   CHF                Action/Plan: Patient lives at home with a friend; PCP: Nolene Ebbs, MD; has private insurance with [facility] with prescription drug coverage; pharmacy of choice is [facility]; patient reports no difficulty getting medication and has a friend for transportation or use the bus.CM talked to patient polysubstance abuse/ cocaine; patient stated that he was in the drug rehab program at the [facility] for [facility], he was with a friend and returned back to his old ways. Emotional support given.CM will continue to follow for progression of care.   Expected Discharge Date:     Possibly 07/04/2018             Expected Discharge Plan:  Home/Self Care  Discharge planning Services  CM Consult  Status of Service:  In process, will continue to follow  Sherrilyn Rist [phone] 07/02/2018, 12:37 PM

## 2018-07-02 NOTE — Care Management CC44 (Signed)
Condition Code 44 Documentation Completed  Patient Details  Name: Billy Parker MRN: [id number] Date of Birth: 1936/11/02   Condition Code 44 given:  Yes Patient signature on Condition Code 44 notice:  Yes Documentation of 2 MD's agreement:  Yes Code 44 added to claim:  Yes    Royston Bake, RN 07/02/2018, 4:14 PM

## 2018-07-03 DIAGNOSIS — I5033 Acute on chronic diastolic (congestive) heart failure: Secondary | ICD-10-CM | POA: Diagnosis not present

## 2018-07-03 LAB — BASIC METABOLIC PANEL
Anion gap: 13 (ref 5–15)
BUN: 18 mg/dL (ref 8–23)
CHLORIDE: 104 mmol/L (ref 98–111)
CO2: 25 mmol/L (ref 22–32)
CREATININE: 1.29 mg/dL — AB (ref 0.61–1.24)
Calcium: 9 mg/dL (ref 8.9–10.3)
GFR calc Af Amer: 59 mL/min — ABNORMAL LOW (ref >60)
GFR calc non Af Amer: 51 mL/min — ABNORMAL LOW (ref >60)
Glucose, Bld: 121 mg/dL — ABNORMAL HIGH (ref 70–99)
Potassium: 3.5 mmol/L (ref 3.5–5.1)
SODIUM: 142 mmol/L (ref 135–145)

## 2018-07-03 MED ORDER — FUROSEMIDE 40 MG PO TABS: 40.0000 mg | ORAL_TABLET | Freq: Every day | ORAL | 0 refills | Status: DC

## 2018-07-03 MED ORDER — ASPIRIN 81 MG PO TBEC: 81.0000 mg | DELAYED_RELEASE_TABLET | Freq: Every day | ORAL | 0 refills | Status: DC

## 2018-07-03 MED ORDER — POTASSIUM CHLORIDE CRYS ER 10 MEQ PO TBCR: 10.0000 meq | EXTENDED_RELEASE_TABLET | Freq: Every day | ORAL | 0 refills | Status: DC

## 2018-07-03 MED ORDER — ALBUTEROL SULFATE HFA 108 (90 BASE) MCG/ACT IN AERS: 2.0000 | INHALATION_SPRAY | Freq: Four times a day (QID) | RESPIRATORY_TRACT | 0 refills | Status: DC | PRN

## 2018-07-03 MED ORDER — LISINOPRIL 2.5 MG PO TABS: 2.5000 mg | ORAL_TABLET | Freq: Every day | ORAL | 0 refills | Status: DC

## 2018-07-03 MED ORDER — CARVEDILOL 3.125 MG PO TABS: 3.1250 mg | ORAL_TABLET | Freq: Two times a day (BID) | ORAL | Status: DC

## 2018-07-03 MED ORDER — ISOSORBIDE MONONITRATE ER 30 MG PO TB24: 15.0000 mg | ORAL_TABLET | Freq: Every day | ORAL | 0 refills | Status: DC

## 2018-07-03 MED ORDER — TOBRADEX 0.3-0.1 % OP OINT: TOPICAL_OINTMENT | Freq: Every day | OPHTHALMIC | 0 refills | Status: DC | PRN

## 2018-07-03 NOTE — Plan of Care (Signed)
  Problem: Activity: Goal: Capacity to carry out activities will improve Outcome: Progressing   Problem: Education: Goal: Ability to verbalize understanding of medication therapies will improve Outcome: Progressing   Problem: Education: Goal: Ability to demonstrate management of disease process will improve Outcome: Progressing   

## 2018-07-03 NOTE — Discharge Summary (Signed)
Physician Discharge Summary  Billy Parker FYB:[id number] DOB: May 16, 1936 DOA: 07/01/2018  PCP: Nolene Ebbs, MD  Admit date: 07/01/2018 Discharge date: 07/03/2018  Admitted From: Home  Disposition:   Home   Recommendations for Outpatient Follow-up and new medication changes:  1. Follow up with Dr. Jeanie Cooks in 7 days.  2. New prescriptions given for his medications 3. Advised to be compliant with his medications and sodium-restricted diet.  4. Patient placed on low-dose ACE inhibitor, further titration as outpatient, no beta-blockade due to history of cocaine abuse.  Home Health: no   Equipment/Devices: no    Discharge Condition: stable  CODE STATUS: full  Diet recommendation: heart healthy   Brief/Interim Summary: 82 year old male who presented with dyspnea.  He does have a significant past medical history of polysubstance abuse, alcohol consumption, hypertension, and chronic diastolic heart failure.  Patient reported 7 days of worsening dyspnea, associated with weight gain and lower extremity edema.  Apparently he stopped taking his medications about 2 weeks prior to hospitalization.  Continue cocaine and alcohol abuse.  On his initial physical examination blood pressure 133/98, heart rate 93, temperature 92, respiratory rate 32, oxygen saturation 89% on room air.  Moist mucous membranes, his lungs had rales bilaterally, heart S1-S2 present,and  irregular, no gallops, rubs or murmurs, the abdomen was soft nontender, positive pitting lower extremity edema +2, bilaterally.  Sodium 138, potassium 3.2, chloride 108, bicarb 20, glucose 160, BUN 12, creatinine 1.36, BNP 1510, white count 6.4, hemoglobin 15.6, hematocrit 48.7, platelets 164.  Urine with specific gravity 1.005, protein 30, white cells 0-5, red cells 0-5.  Chest x-ray with increased interstitial markings bilaterally.  EKG with LVH with multiple PVCs, normal axis.  Patient was admitted to the hospital with the working diagnosis of acute  on chronic diastolic heart failure exacerbation, complicated by acute hypoxic respiratory failure due to acute cardiogenic pulmonary edema.   1.  Acute on chronic decompensation of diastolic heart failure.  Patient was admitted to the medical unit, he was placed on a remote telemetry monitor, received aggressive diuresis with IV furosemide, negative fluid balance was achieved (negative 3,127 ml since admission) with significant improvement of his symptoms.  Continue heart failure therapy with isosorbide, low-dose lisinopril was added, no beta-blockade due to history of cocaine abuse.  Continue diuresis with furosemide.  Further work-up with echocardiography showed reduction on his left ventricle ejection fraction down to 40 to 45%, from 55% May 2018.  He was advised to be compliant with his medications as well as sodium and fluid restricted diet.  New prescriptions given.  2.  Acute hypoxic respiratory failure due to acute cardiogenic pulmonary edema. (Present on admission).  Patient was aggressively diuresed with furosemide, he received supplemental oxygen per nasal cannula and oximetry monitoring.  Discharge oximetry is 96% on room air.  3.  Hypertension.  Continue isosorbide, added low-dose lisinopril.  Follow-up as an outpatient for further titration.  4.  Stage II chronic kidney disease with hypokalemia.  Patient tolerated well diuresis, potassium was corrected with potassium chloride, he will continue taking furosemide 40 mg daily and 10 meq potassium daily.  Follow-up kidney function as an outpatient.  Patient has been started on low-dose ACE inhibitor.  5.  Polysubstance and alcohol abuse.  No withdrawal symptoms, patient was advised to avoid substance abuse.     Discharge Diagnoses:  Principal Problem:   Acute on chronic diastolic heart failure (HCC) Active Problems:   Hypertension   Polysubstance abuse (HCC)   Acute hypokalemia  Acute renal insufficiency   Acute respiratory failure  with hypoxia (HCC)   Acute on chronic diastolic CHF (congestive heart failure) ([place])    Discharge Instructions   Allergies as of 07/03/2018      Reactions   Other Swelling   Antibiotic (name not recalled by the patient) = both legs became swollen      Medication List    TAKE these medications   albuterol 108 (90 Base) MCG/ACT inhaler Commonly known as:  PROVENTIL HFA;VENTOLIN HFA Inhale 2 puffs into the lungs every 6 (six) hours as needed for wheezing or shortness of breath.   aspirin 81 MG EC tablet Take 1 tablet (81 mg total) by mouth daily.   furosemide 40 MG tablet Commonly known as:  LASIX Take 1 tablet (40 mg total) by mouth daily.   isosorbide mononitrate 30 MG 24 hr tablet Commonly known as:  IMDUR Take 0.5 tablets (15 mg total) by mouth daily.   lisinopril 2.5 MG tablet Commonly known as:  PRINIVIL,ZESTRIL Take 1 tablet (2.5 mg total) by mouth daily.   multivitamin with minerals Tabs tablet Take 1 tablet by mouth daily.   potassium chloride 10 MEQ tablet Commonly known as:  K-DUR,KLOR-CON Take 1 tablet (10 mEq total) by mouth daily.   TOBRADEX ophthalmic ointment Generic drug:  tobramycin-dexamethasone Place into both eyes daily as needed. What changed:    how much to take  reasons to take this       Allergies  Allergen Reactions  . Other Swelling    Antibiotic (name not recalled by the patient) = both legs became swollen    Consultations:     Procedures/Studies: Dg Chest Port 1 View  Result Date: 07/01/2018 CLINICAL DATA:  Nausea and vomiting. Shortness of breath over the last week. EXAM: PORTABLE CHEST 1 VIEW COMPARISON:  05/10/2017 FINDINGS: Heart size upper limits of normal. Aortic atherosclerosis is noted. No consolidation or lobar collapse. Question small areas of patchy density both lungs that could go along with mild patchy pneumonia or aspiration. No effusions. Bony structures are unremarkable. IMPRESSION: No definite active disease.  Question few areas of patchy density that could go along with mild pneumonia or aspiration. Electronically Signed   By: Nelson Chimes M.D.   On: 07/01/2018 07:38       Subjective: Patient is feeling better, back to his baseline, edema and dyspnea have improved, no nausea or vomiting, no chest pain.   Discharge Exam: Vitals:   07/03/18 0818 07/03/18 0905  BP: (!) 146/94 124/84  Pulse: 97 92  Resp: 18 18  Temp: 97.9 F (36.6 C) 97.8 F (36.6 C)  SpO2: 92% 96%   Vitals:   07/03/18 0440 07/03/18 0500 07/03/18 0818 07/03/18 0905  BP: (!) 122/91  (!) 146/94 124/84  Pulse: (!) 36 95 97 92  Resp: 18  18 18   Temp: 98.4 F (36.9 C)  97.9 F (36.6 C) 97.8 F (36.6 C)  TempSrc: Oral  Oral Oral  SpO2: 94%  92% 96%  Weight: 76.3 kg (168 lb 3.2 oz)     Height:        General: Not in pain or dyspnea Neurology: Awake and alert, non focal  E ENT: no pallor, no icterus, oral mucosa moist Cardiovascular: No JVD. S1-S2 present, rhythmic, no gallops, rubs, or murmurs. Trace lower extremity edema. Pulmonary: vesicular breath sounds bilaterally, adequate air movement, no wheezing, rhonchi or rales. Gastrointestinal. Abdomen with no organomegaly, non tender, no rebound or guarding Skin. No rashes  Musculoskeletal: no joint deformities   The results of significant diagnostics from this hospitalization (including imaging, microbiology, ancillary and laboratory) are listed below for reference.     Microbiology: No results found for this or any previous visit (from the past 240 hour(s)).   Labs: BNP (last 3 results) Recent Labs    07/01/18 0724  BNP 5,456.2*   Basic Metabolic Panel: Recent Labs  Lab 07/01/18 0724 07/01/18 1000 07/01/18 2149 07/02/18 0541 07/03/18 0633  NA 138  --   --  141 142  K 3.2*  --  3.7 3.7 3.5  CL 108  --   --  109 104  CO2 20*  --   --  24 25  GLUCOSE 160*  --   --  107* 121*  BUN 12  --   --  16 18  CREATININE 1.36*  --   --  1.34* 1.29*  CALCIUM  8.4*  --   --  8.7* 9.0  MG  --  1.7 1.8  --   --    Liver Function Tests: Recent Labs  Lab 07/01/18 0724  AST 44*  ALT 27  ALKPHOS 70  BILITOT 0.8  PROT 7.1  ALBUMIN 3.1*   Recent Labs  Lab 07/01/18 0724  LIPASE 49   No results for input(s): AMMONIA in the last 168 hours. CBC: Recent Labs  Lab 07/01/18 0724  WBC 6.4  NEUTROABS 3.5  HGB 15.6  HCT 48.7  MCV 89.9  PLT 164   Cardiac Enzymes: Recent Labs  Lab 07/01/18 1000 07/01/18 1534 07/01/18 2149  TROPONINI 0.06* 0.08* 0.08*   BNP: Invalid input(s): POCBNP CBG: No results for input(s): GLUCAP in the last 168 hours. D-Dimer No results for input(s): DDIMER in the last 72 hours. Hgb A1c No results for input(s): HGBA1C in the last 72 hours. Lipid Profile Recent Labs    07/02/18 0541  CHOL 142  HDL 52  LDLCALC 75  TRIG 77  CHOLHDL 2.7   Thyroid function studies No results for input(s): TSH, T4TOTAL, T3FREE, THYROIDAB in the last 72 hours.  Invalid input(s): FREET3 Anemia work up No results for input(s): VITAMINB12, FOLATE, FERRITIN, TIBC, IRON, RETICCTPCT in the last 72 hours. Urinalysis    Component Value Date/Time   COLORURINE STRAW (A) 07/01/2018 1002   APPEARANCEUR CLEAR 07/01/2018 1002   LABSPEC 1.005 07/01/2018 1002   PHURINE 5.0 07/01/2018 1002   GLUCOSEU NEGATIVE 07/01/2018 1002   HGBUR SMALL (A) 07/01/2018 1002   BILIRUBINUR NEGATIVE 07/01/2018 1002   KETONESUR NEGATIVE 07/01/2018 1002   PROTEINUR 30 (A) 07/01/2018 1002   UROBILINOGEN 1.0 09/18/2015 1032   NITRITE NEGATIVE 07/01/2018 1002   LEUKOCYTESUR NEGATIVE 07/01/2018 1002   Sepsis Labs Invalid input(s): PROCALCITONIN,  WBC,  LACTICIDVEN Microbiology No results found for this or any previous visit (from the past 240 hour(s)).   Time coordinating discharge: 45 minutes  SIGNED:   Tawni Millers, MD  [facility] 07/03/2018, 10:04 AM Pager [phone]  If 7PM-7AM, please contact  night-coverage [email or web address] Password [email or web address]

## 2018-07-09 ENCOUNTER — Emergency Department (HOSPITAL_COMMUNITY): Payer: Medicare Other

## 2018-07-09 ENCOUNTER — Encounter (HOSPITAL_COMMUNITY): Payer: Self-pay

## 2018-07-09 ENCOUNTER — Inpatient Hospital Stay (HOSPITAL_COMMUNITY)
Admission: EM | Admit: 2018-07-09 | Discharge: 2018-07-10 | DRG: 291 | Disposition: A | Discharge status: Home / Self Care | Payer: Medicare Other | Attending: Family Medicine | Admitting: Family Medicine

## 2018-07-09 ENCOUNTER — Other Ambulatory Visit: Payer: Self-pay

## 2018-07-09 DIAGNOSIS — I255 Ischemic cardiomyopathy: Secondary | ICD-10-CM | POA: Diagnosis not present

## 2018-07-09 DIAGNOSIS — Z881 Allergy status to other antibiotic agents status: Secondary | ICD-10-CM | POA: Diagnosis not present

## 2018-07-09 DIAGNOSIS — Z9119 Patient's noncompliance with other medical treatment and regimen: Secondary | ICD-10-CM

## 2018-07-09 DIAGNOSIS — I5043 Acute on chronic combined systolic (congestive) and diastolic (congestive) heart failure: Secondary | ICD-10-CM | POA: Diagnosis present

## 2018-07-09 DIAGNOSIS — N182 Chronic kidney disease, stage 2 (mild): Secondary | ICD-10-CM | POA: Diagnosis present

## 2018-07-09 DIAGNOSIS — Z79899 Other long term (current) drug therapy: Secondary | ICD-10-CM | POA: Diagnosis not present

## 2018-07-09 DIAGNOSIS — I509 Heart failure, unspecified: Secondary | ICD-10-CM

## 2018-07-09 DIAGNOSIS — F1410 Cocaine abuse, uncomplicated: Secondary | ICD-10-CM | POA: Diagnosis present

## 2018-07-09 DIAGNOSIS — I2510 Atherosclerotic heart disease of native coronary artery without angina pectoris: Secondary | ICD-10-CM | POA: Diagnosis present

## 2018-07-09 DIAGNOSIS — Z7982 Long term (current) use of aspirin: Secondary | ICD-10-CM

## 2018-07-09 DIAGNOSIS — N183 Chronic kidney disease, stage 3 (moderate): Secondary | ICD-10-CM | POA: Diagnosis present

## 2018-07-09 DIAGNOSIS — E1122 Type 2 diabetes mellitus with diabetic chronic kidney disease: Secondary | ICD-10-CM | POA: Diagnosis not present

## 2018-07-09 DIAGNOSIS — I429 Cardiomyopathy, unspecified: Secondary | ICD-10-CM | POA: Diagnosis present

## 2018-07-09 DIAGNOSIS — I130 Hypertensive heart and chronic kidney disease with heart failure and stage 1 through stage 4 chronic kidney disease, or unspecified chronic kidney disease: Principal | ICD-10-CM | POA: Diagnosis present

## 2018-07-09 DIAGNOSIS — E1165 Type 2 diabetes mellitus with hyperglycemia: Secondary | ICD-10-CM | POA: Diagnosis present

## 2018-07-09 LAB — CBC
HCT: 51.4 % (ref 39.0–52.0)
HEMOGLOBIN: 16.6 g/dL (ref 13.0–17.0)
MCH: 28.9 pg (ref 26.0–34.0)
MCHC: 32.3 g/dL (ref 30.0–36.0)
MCV: 89.5 fL (ref 78.0–100.0)
PLATELETS: 172 10*3/uL (ref 150–400)
RBC: 5.74 MIL/uL (ref 4.22–5.81)
RDW: 14.2 % (ref 11.5–15.5)
WBC: 5.9 10*3/uL (ref 4.0–10.5)

## 2018-07-09 LAB — BASIC METABOLIC PANEL
ANION GAP: 9 (ref 5–15)
BUN: 11 mg/dL (ref 8–23)
CALCIUM: 9.3 mg/dL (ref 8.9–10.3)
CO2: 28 mmol/L (ref 22–32)
Chloride: 105 mmol/L (ref 98–111)
Creatinine, Ser: 1.38 mg/dL — ABNORMAL HIGH (ref 0.61–1.24)
GFR calc Af Amer: 54 mL/min — ABNORMAL LOW (ref >60)
GFR, EST NON AFRICAN AMERICAN: 47 mL/min — AB (ref >60)
GLUCOSE: 114 mg/dL — AB (ref 70–99)
Potassium: 3.7 mmol/L (ref 3.5–5.1)
Sodium: 142 mmol/L (ref 135–145)

## 2018-07-09 LAB — MAGNESIUM: Magnesium: 1.8 mg/dL (ref 1.7–2.4)

## 2018-07-09 LAB — RAPID URINE DRUG SCREEN, HOSP PERFORMED
Amphetamines: NOT DETECTED
Benzodiazepines: NOT DETECTED
Cocaine: NOT DETECTED
Opiates: NOT DETECTED
Tetrahydrocannabinol: NOT DETECTED

## 2018-07-09 LAB — HEMOGLOBIN A1C
HEMOGLOBIN A1C: 6.6 % — AB (ref 4.8–5.6)
MEAN PLASMA GLUCOSE: 142.72 mg/dL

## 2018-07-09 LAB — I-STAT TROPONIN, ED: TROPONIN I, POC: 0 ng/mL (ref 0.00–0.08)

## 2018-07-09 LAB — BRAIN NATRIURETIC PEPTIDE: B Natriuretic Peptide: 1062.3 pg/mL — ABNORMAL HIGH (ref 0.0–100.0)

## 2018-07-09 MED ORDER — ALBUTEROL SULFATE (2.5 MG/3ML) 0.083% IN NEBU: 3.0000 mL | INHALATION_SOLUTION | Freq: Four times a day (QID) | RESPIRATORY_TRACT | Status: DC | PRN

## 2018-07-09 MED ORDER — ENOXAPARIN SODIUM 40 MG/0.4ML ~~LOC~~ SOLN
40.0000 mg | SUBCUTANEOUS | Status: DC
  Administered 2018-07-10: 40 mg via SUBCUTANEOUS
  Filled 2018-07-09: qty 0.4

## 2018-07-09 MED ORDER — ASPIRIN EC 81 MG PO TBEC
81.0000 mg | DELAYED_RELEASE_TABLET | Freq: Every day | ORAL | Status: DC
  Administered 2018-07-09 – 2018-07-10 (×2): 81 mg via ORAL
  Filled 2018-07-09 (×2): qty 1

## 2018-07-09 MED ORDER — ACETAMINOPHEN 325 MG PO TABS: 650.0000 mg | ORAL_TABLET | Freq: Four times a day (QID) | ORAL | Status: DC | PRN

## 2018-07-09 MED ORDER — FUROSEMIDE 10 MG/ML IJ SOLN
40.0000 mg | Freq: Once | INTRAMUSCULAR | Status: AC
  Administered 2018-07-09: 40 mg via INTRAVENOUS
  Filled 2018-07-09: qty 4

## 2018-07-09 MED ORDER — FUROSEMIDE 10 MG/ML IJ SOLN
40.0000 mg | Freq: Two times a day (BID) | INTRAMUSCULAR | Status: DC
  Administered 2018-07-09 – 2018-07-10 (×2): 40 mg via INTRAVENOUS
  Filled 2018-07-09 (×2): qty 4

## 2018-07-09 MED ORDER — ACETAMINOPHEN 650 MG RE SUPP: 650.0000 mg | Freq: Four times a day (QID) | RECTAL | Status: DC | PRN

## 2018-07-09 MED ORDER — ENOXAPARIN SODIUM 30 MG/0.3ML ~~LOC~~ SOLN: 30.0000 mg | SUBCUTANEOUS | Status: DC

## 2018-07-09 MED ORDER — POTASSIUM CHLORIDE CRYS ER 20 MEQ PO TBCR
40.0000 meq | EXTENDED_RELEASE_TABLET | Freq: Two times a day (BID) | ORAL | Status: DC
  Administered 2018-07-09 – 2018-07-10 (×2): 40 meq via ORAL
  Filled 2018-07-09 (×2): qty 2

## 2018-07-09 NOTE — ED Triage Notes (Signed)
Pt endorses shob x 5 days and recently admitted for "fluid on my lungs and my legs swollen up" Takes a fluid pill. Speaking in complete sentences. VSS

## 2018-07-09 NOTE — ED Notes (Signed)
Called lab to have Mag added to labs already drawn.

## 2018-07-09 NOTE — ED Notes (Signed)
Pt aware of need for urine sample, urinal provided.

## 2018-07-09 NOTE — ED Notes (Signed)
ED Provider at bedside. 

## 2018-07-09 NOTE — ED Notes (Addendum)
Pt's SpO2 dropped to 87% on Room Air while talking with the provider.  Pt placed on 2L/min via nasal cannula per Dr. Ellender Hose and pt's SpO2 increased to 99%.

## 2018-07-09 NOTE — Plan of Care (Signed)
  Problem: Education: Goal: Knowledge of General Education information will improve Outcome: Progressing   Problem: Health Behavior/Discharge Planning: Goal: Ability to manage health-related needs will improve Outcome: Progressing   Problem: Clinical Measurements: Goal: Ability to maintain clinical measurements within normal limits will improve Outcome: Progressing   Problem: Clinical Measurements: Goal: Respiratory complications will improve Outcome: Progressing   Problem: Activity: Goal: Risk for activity intolerance will decrease Outcome: Progressing
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
## 2018-07-10 DIAGNOSIS — N183 Chronic kidney disease, stage 3 (moderate): Secondary | ICD-10-CM | POA: Diagnosis not present

## 2018-07-10 DIAGNOSIS — I255 Ischemic cardiomyopathy: Secondary | ICD-10-CM | POA: Diagnosis not present

## 2018-07-10 DIAGNOSIS — I130 Hypertensive heart and chronic kidney disease with heart failure and stage 1 through stage 4 chronic kidney disease, or unspecified chronic kidney disease: Secondary | ICD-10-CM | POA: Diagnosis not present

## 2018-07-10 DIAGNOSIS — I5043 Acute on chronic combined systolic (congestive) and diastolic (congestive) heart failure: Secondary | ICD-10-CM | POA: Diagnosis not present

## 2018-07-10 LAB — BASIC METABOLIC PANEL
Anion gap: 13 (ref 5–15)
BUN: 16 mg/dL (ref 8–23)
CO2: 27 mmol/L (ref 22–32)
CREATININE: 1.57 mg/dL — AB (ref 0.61–1.24)
Calcium: 9.1 mg/dL (ref 8.9–10.3)
Chloride: 101 mmol/L (ref 98–111)
GFR calc Af Amer: 46 mL/min — ABNORMAL LOW (ref >60)
GFR, EST NON AFRICAN AMERICAN: 40 mL/min — AB (ref >60)
Glucose, Bld: 115 mg/dL — ABNORMAL HIGH (ref 70–99)
POTASSIUM: 3.7 mmol/L (ref 3.5–5.1)
SODIUM: 141 mmol/L (ref 135–145)

## 2018-07-10 LAB — CBC
HCT: 51.1 % (ref 39.0–52.0)
HEMOGLOBIN: 16.6 g/dL (ref 13.0–17.0)
MCH: 29 pg (ref 26.0–34.0)
MCHC: 32.5 g/dL (ref 30.0–36.0)
MCV: 89.2 fL (ref 78.0–100.0)
PLATELETS: 171 10*3/uL (ref 150–400)
RBC: 5.73 MIL/uL (ref 4.22–5.81)
RDW: 14.2 % (ref 11.5–15.5)
WBC: 5.7 10*3/uL (ref 4.0–10.5)

## 2018-07-10 MED ORDER — ISOSORBIDE MONONITRATE ER 30 MG PO TB24: 15.0000 mg | ORAL_TABLET | Freq: Every day | ORAL | 0 refills | Status: AC

## 2018-07-10 MED ORDER — POTASSIUM CHLORIDE CRYS ER 10 MEQ PO TBCR: 10.0000 meq | EXTENDED_RELEASE_TABLET | Freq: Every day | ORAL | 0 refills | Status: DC

## 2018-07-10 MED ORDER — GUAIFENESIN-DM 100-10 MG/5ML PO SYRP
5.0000 mL | ORAL_SOLUTION | ORAL | Status: DC | PRN
Start: 2018-07-10 — End: 2018-07-10
  Administered 2018-07-10: 5 mL via ORAL
  Filled 2018-07-10: qty 5

## 2018-07-10 MED ORDER — ASPIRIN 81 MG PO TBEC: 81.0000 mg | DELAYED_RELEASE_TABLET | Freq: Every day | ORAL | 0 refills | Status: AC

## 2018-07-10 MED ORDER — FUROSEMIDE 40 MG PO TABS: 40.0000 mg | ORAL_TABLET | Freq: Every day | ORAL | 0 refills | Status: AC

## 2018-07-10 MED ORDER — ALBUTEROL SULFATE HFA 108 (90 BASE) MCG/ACT IN AERS: 2.0000 | INHALATION_SPRAY | Freq: Four times a day (QID) | RESPIRATORY_TRACT | 0 refills | Status: AC | PRN

## 2018-07-10 MED ORDER — LISINOPRIL 2.5 MG PO TABS: 2.5000 mg | ORAL_TABLET | Freq: Every day | ORAL | 0 refills | Status: DC

## 2018-07-10 NOTE — Care Management Note (Signed)
Case Management Note  Patient Details  Name: Billy Parker MRN: [id number] Date of Birth: 1936/11/15  Subjective/Objective: CHF                   Action/Plan: 07/10/2018 CM talked to patient at the bedside concerning not getting his medication at discharge, he stated that his medication was sent to the Pharmacy/ [facility] on [address] instead of [address] and he did not know how to get to the pharmacy. CM called the pharmacist and they did transfer his medication to the right pharmacy and he picked up some of his medication 07/04/2018; they are still awaiting for him to pick up the rest of his medication. CM will continue to follow for progression of care.Aneta Mins [phone]  07/02/2018 - Action/Plan: Patient lives at home with a friend; [id number], Christean Grief, MD; has private insurance with [facility] with prescription drug coverage; pharmacy of choice is [facility]; patient reports no difficulty getting medication and has a friend for transportation or use the bus.CM talked to patient polysubstance abuse/ cocaine; patient stated that he was in the drug rehab program at the [facility] for [facility] yrs, he was with a friend and returned back to his old ways. Emotional support given.CM will continue to follow for progression of care.  Expected Discharge Date:    possibly 07/14/2018              Expected Discharge Plan:   Home / self care  Discharge planning Services  CM Consult  Status of Service:  In process, will continue to follow  Sherrilyn Rist [phone] 07/10/2018, 10:14 AM

## 2018-07-10 NOTE — Discharge Summary (Signed)
Physician Discharge Summary  Billy Parker GBT:[id number] DOB: 05/01/1936 DOA: 07/09/2018  PCP: Nolene Ebbs, MD  Admit date: 07/09/2018 Discharge date: 07/10/2018  Time spent: 35* minutes  Recommendations for Outpatient Follow-up:  1. Follow up PCP in 2 weeks   Discharge Diagnoses:  Principal Problem:   Acute on chronic combined systolic and diastolic CHF (congestive heart failure) (HCC) Active Problems:   NICM- severe LVD by echo 05/19/15   H/O Cocaine abuse   CAD- non obstructive 2014   CKD (chronic kidney disease) stage 3, GFR 30-59 ml/min (HCC)   CHF (congestive heart failure) ([place])   Discharge Condition: Stable  Diet recommendation: Heart healthy diet  Filed Weights   07/09/18 1237 07/09/18 1744 07/10/18 0414  Weight: 77.1 kg (170 lb) 76.7 kg (169 lb) 75.3 kg (166 lb)    History of present illness:  82 y.o. male with medical history significant of chronic systolic and diastolic CHF, previously EF as low as 30%, improved to 55% and then down to 45% based on recent echo from last week, history of cocaine abuse, HTN,  PVCs was just admitted to [facility] about 10 days ago with CHF exacerbation, improved with diuresis and had to leave earlier than anticipated due to overdue rent etc, following discharge he was unable to get his medications including diuretics as this was sent to a different [facility] and he had transportation issues getting there. -Over the past week patient reports progressive worsening dyspnea limiting activities of daily living, orthopnea and swelling. -He denies any cocaine use or alcohol use following recent discharge ED Course: noted to be volume overloaded, given Lasix 1    Hospital Course:  Acute on chronic combined systolic and diastolic CHF-patient's ejection fraction is 45%, history of nonischemic cardia myopathy by cardiac cath in 2014.  Patient came in with exacerbation of CHF due to noncompliance with medications.  Improved after IV  Lasix.  At this time patient is stable and will be discharged home on his home medications.  I will send him with prescription for Lasix 40 mg p.o. Daily.  Chronic kidney disease stage III-stable, creatinine is close to baseline.  Continue low-dose lisinopril 2.5 mg p.o. daily.  History of cocaine abuse-patient reports abstinence since previous hospitalization.  UDS negative for cocaine.  CAD-nonobstructive CAD by cath in 2014.  Continue aspirin.  Hyperglycemia-hemoglobin A1c 6.6, fasting blood glucose this morning is 115.  Will need to repeat A1c in 6 months, will give patient education on dietary management with the diabetes mellitus.  If continues to be elevated patient needs to be on oral hypoglycemic agents.  He will follow-up with the PCP   Procedures:  None  Consultations:  None  Discharge Exam: Vitals:   07/10/18 0834 07/10/18 1134  BP: (!) 125/100 136/79  Pulse: 60 86  Resp:  20  Temp:  98.4 F (36.9 C)  SpO2:  96%    General: *Appears in no acute distress Cardiovascular: S1-S2, regular Respiratory: Clear bilaterally  Discharge Instructions   Discharge Instructions    Diet - low sodium heart healthy   Complete by:  As directed    Increase activity slowly   Complete by:  As directed      Allergies as of 07/10/2018      Reactions   Other Swelling   Antibiotic (name not recalled by the patient) = both legs became swollen      Medication List    STOP taking these medications   TOBRADEX ophthalmic ointment Generic drug:  tobramycin-dexamethasone     TAKE these medications   albuterol 108 (90 Base) MCG/ACT inhaler Commonly known as:  PROVENTIL HFA;VENTOLIN HFA Inhale 2 puffs into the lungs every 6 (six) hours as needed for wheezing or shortness of breath.   aspirin 81 MG EC tablet Take 1 tablet (81 mg total) by mouth daily.   furosemide 40 MG tablet Commonly known as:  LASIX Take 1 tablet (40 mg total) by mouth daily.   isosorbide mononitrate 30  MG 24 hr tablet Commonly known as:  IMDUR Take 0.5 tablets (15 mg total) by mouth daily.   lisinopril 2.5 MG tablet Commonly known as:  PRINIVIL,ZESTRIL Take 1 tablet (2.5 mg total) by mouth daily.   multivitamin with minerals Tabs tablet Take 1 tablet by mouth daily.   potassium chloride 10 MEQ tablet Commonly known as:  K-DUR,KLOR-CON Take 1 tablet (10 mEq total) by mouth daily.      Allergies  Allergen Reactions  . Other Swelling    Antibiotic (name not recalled by the patient) = both legs became swollen      The results of significant diagnostics from this hospitalization (including imaging, microbiology, ancillary and laboratory) are listed below for reference.    Significant Diagnostic Studies: Dg Chest 2 View  Result Date: 07/09/2018 CLINICAL DATA:  Shortness of breath, bilateral lower extremity swelling. EXAM: CHEST - 2 VIEW COMPARISON:  07/01/2018. FINDINGS: Trachea is midline. Heart size stable. Bibasilar pulmonary parenchymal scarring. No airspace consolidation or pleural fluid. Incidental note is made of a single fractured sternal wire. IMPRESSION: No acute findings. Electronically Signed   By: Lorin Picket M.D.   On: 07/09/2018 13:09   Dg Chest Port 1 View  Result Date: 07/01/2018 CLINICAL DATA:  Nausea and vomiting. Shortness of breath over the last week. EXAM: PORTABLE CHEST 1 VIEW COMPARISON:  05/10/2017 FINDINGS: Heart size upper limits of normal. Aortic atherosclerosis is noted. No consolidation or lobar collapse. Question small areas of patchy density both lungs that could go along with mild patchy pneumonia or aspiration. No effusions. Bony structures are unremarkable. IMPRESSION: No definite active disease. Question few areas of patchy density that could go along with mild pneumonia or aspiration. Electronically Signed   By: Nelson Chimes M.D.   On: 07/01/2018 07:38    Microbiology: No results found for this or any previous visit (from the past 240  hour(s)).   Labs: Basic Metabolic Panel: Recent Labs  Lab 07/09/18 1249 07/10/18 0557  NA 142 141  K 3.7 3.7  CL 105 101  CO2 28 27  GLUCOSE 114* 115*  BUN 11 16  CREATININE 1.38* 1.57*  CALCIUM 9.3 9.1  MG 1.8  --    Liver Function Tests: No results for input(s): AST, ALT, ALKPHOS, BILITOT, PROT, ALBUMIN in the last 168 hours. No results for input(s): LIPASE, AMYLASE in the last 168 hours. No results for input(s): AMMONIA in the last 168 hours. CBC: Recent Labs  Lab 07/09/18 1249 07/10/18 0557  WBC 5.9 5.7  HGB 16.6 16.6  HCT 51.4 51.1  MCV 89.5 89.2  PLT 172 171   Cardiac Enzymes: No results for input(s): CKTOTAL, CKMB, CKMBINDEX, TROPONINI in the last 168 hours. BNP: BNP (last 3 results) Recent Labs    07/01/18 0724 07/09/18 1249  BNP 1,510.8* 1,062.3*    ProBNP (last 3 results) No results for input(s): PROBNP in the last 8760 hours.  CBG: No results for input(s): GLUCAP in the last 168 hours.     Signed:  Oswald Hillock MD.  [facility] 07/10/2018, 11:47 AM

## 2018-07-10 NOTE — Progress Notes (Signed)
Discharge to home.D/c instructions and follow up appointments discussed with patient,verbalized understanding.

## 2019-04-01 IMAGING — CR DG CHEST 2V
2 series · 2 of 2 positions shown · non-contrast
Comparison: 07/01/2018.

CLINICAL DATA: Shortness of breath, bilateral lower extremity
swelling.

EXAM:
CHEST - 2 VIEW

[chest pa]
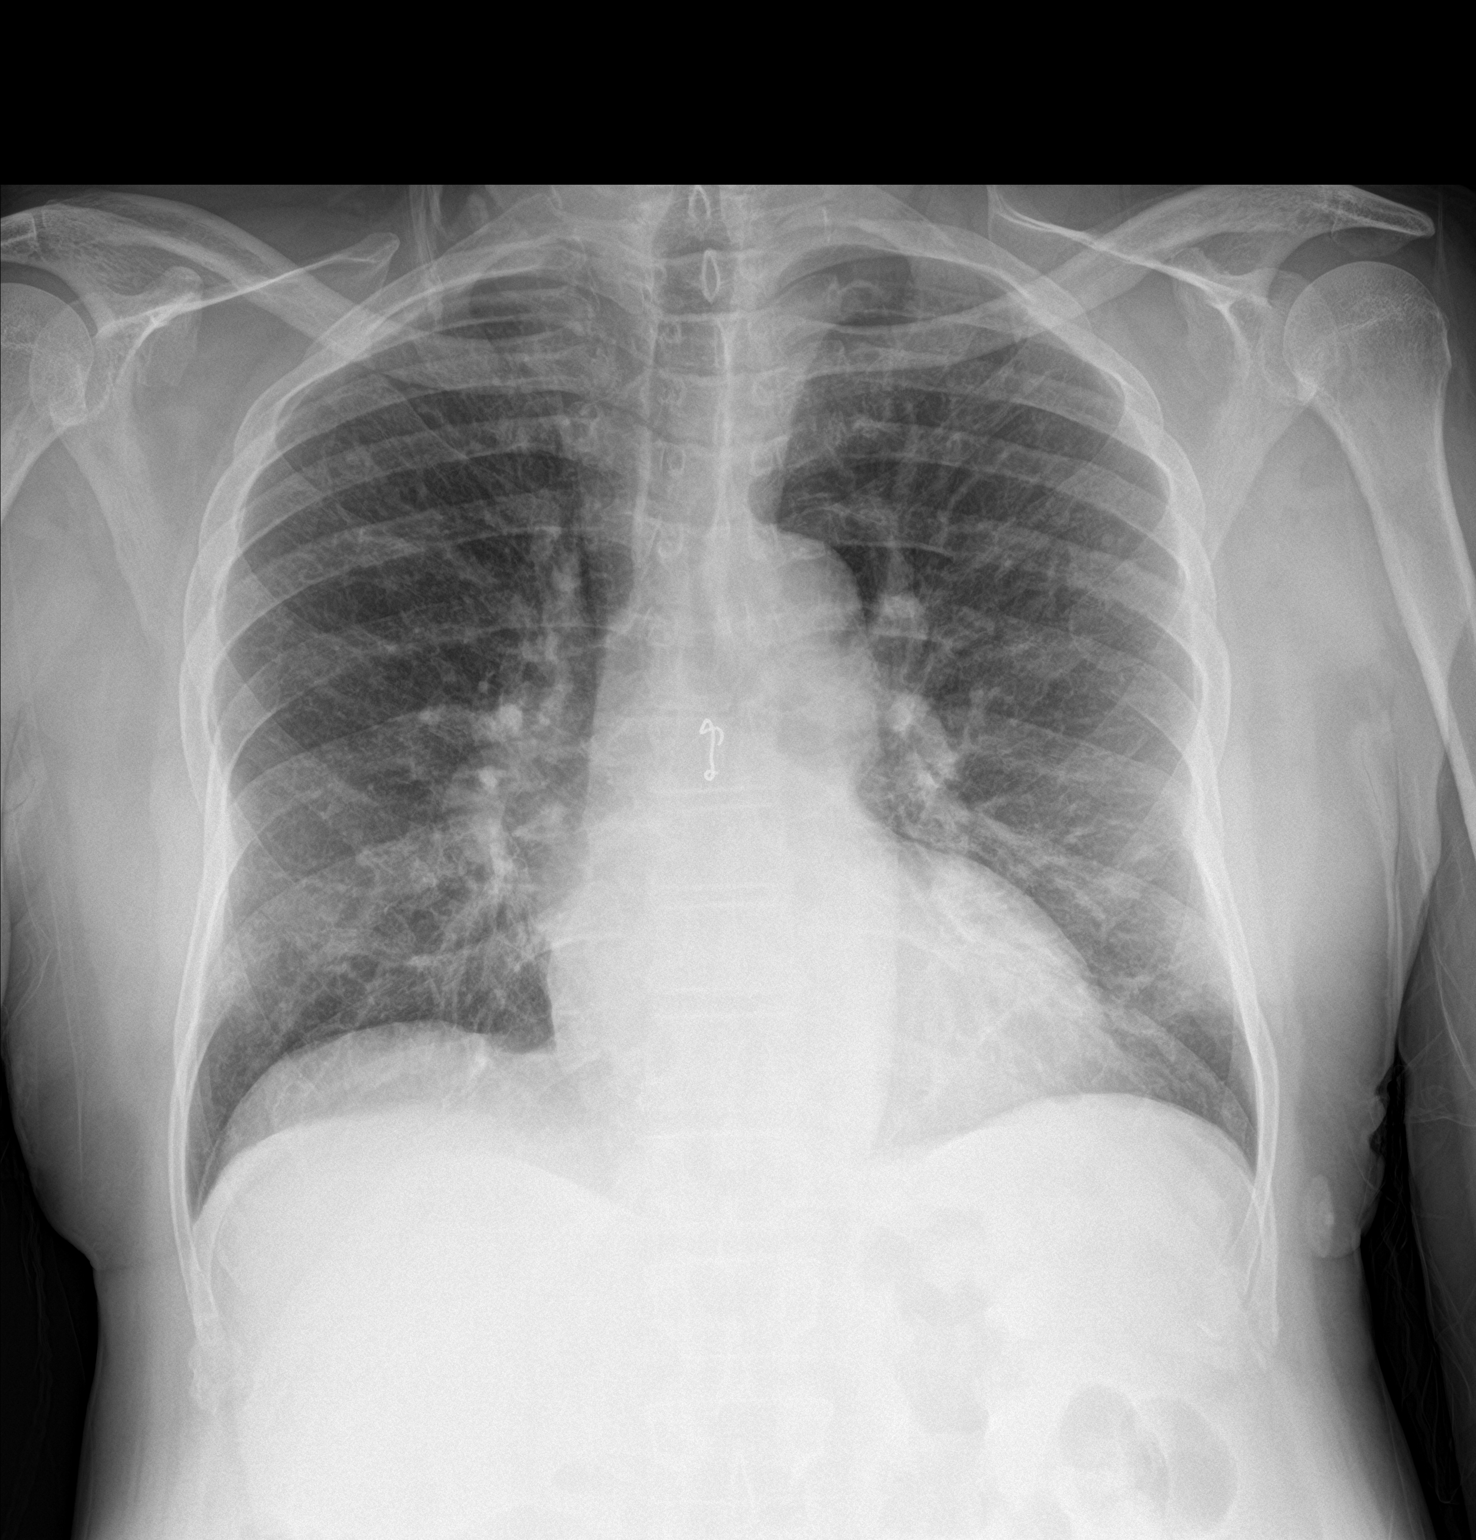

[chest lat]
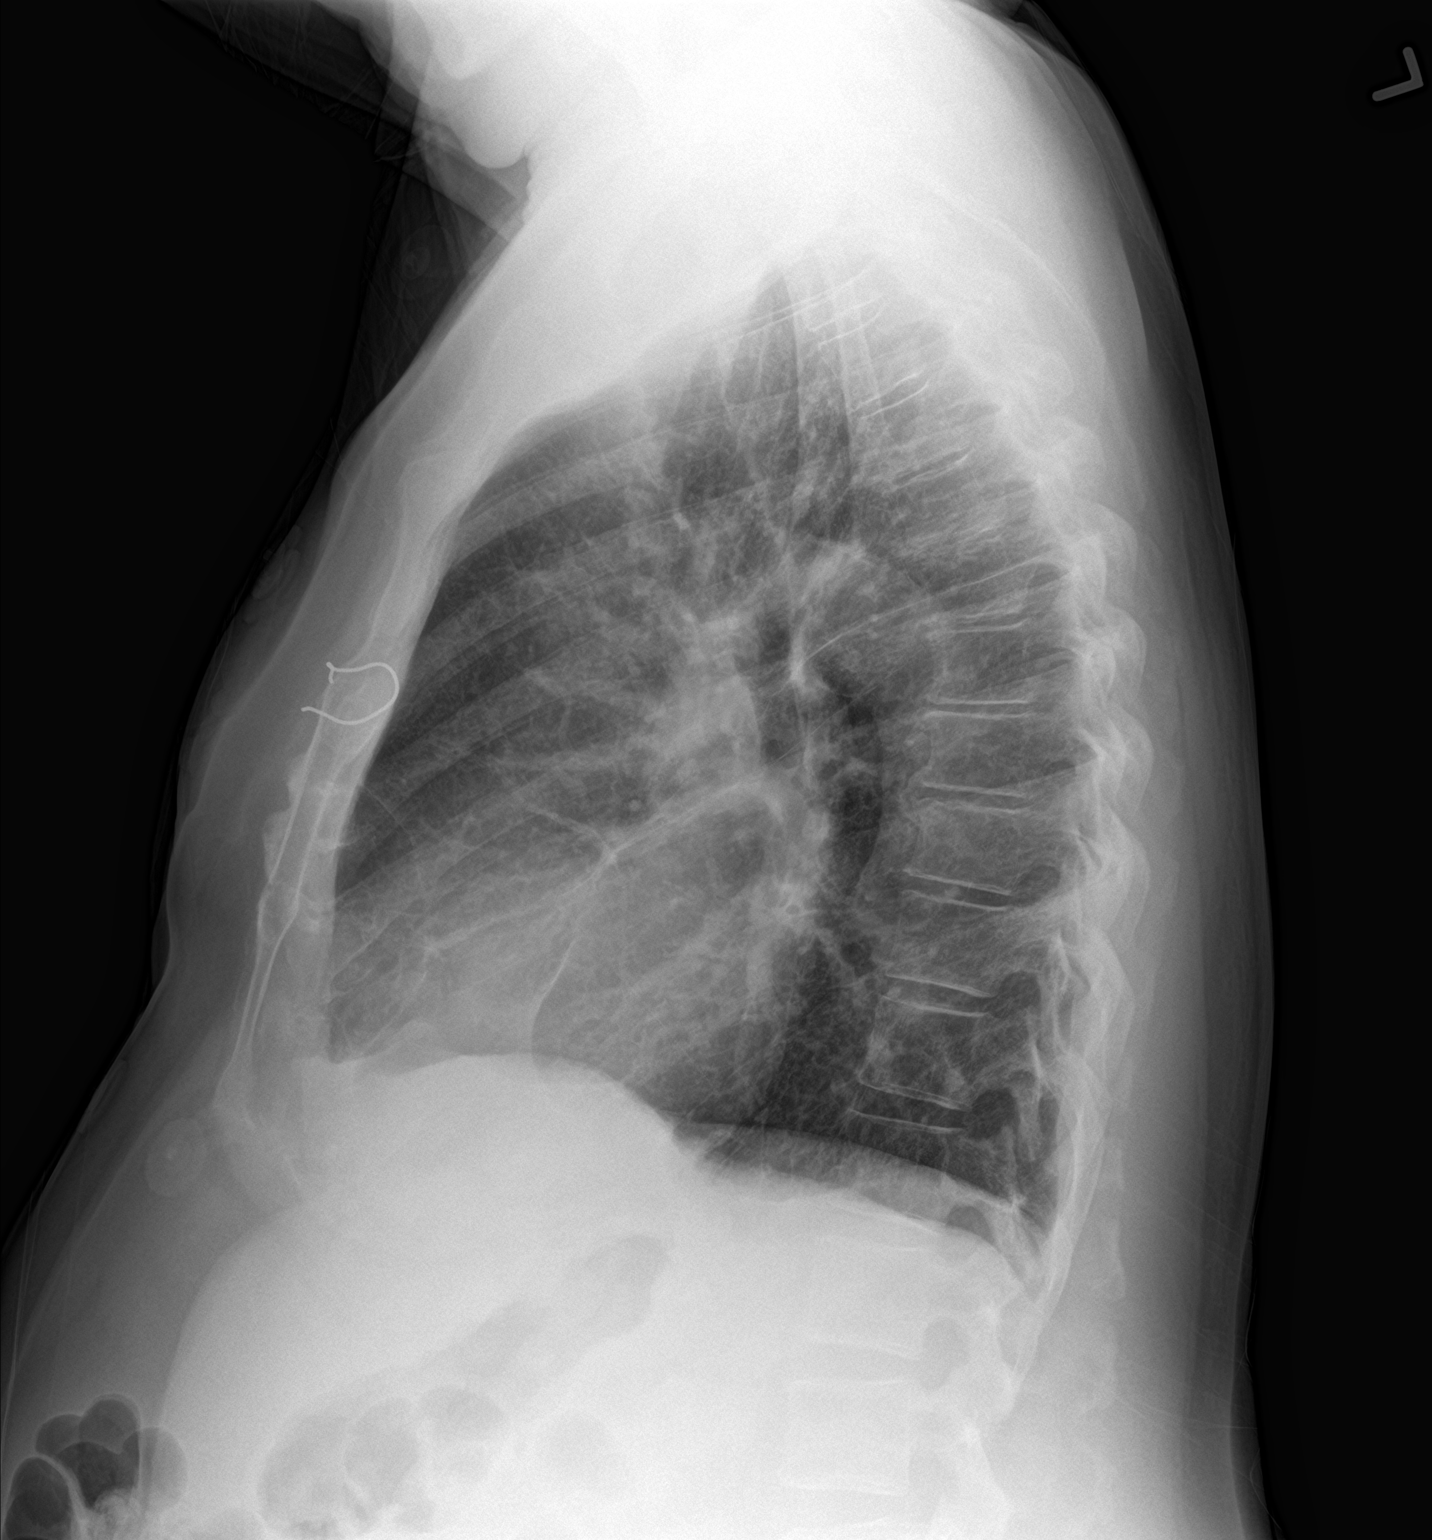

[2 of 2 positions shown; findings below may reference images not displayed]

FINDINGS: Trachea is midline. Heart size stable. Bibasilar pulmonary
parenchymal scarring. No airspace consolidation or pleural fluid.
Incidental note is made of a single fractured sternal wire.
IMPRESSION: No acute findings.

## 2019-07-13 ENCOUNTER — Encounter: Payer: Self-pay | Admitting: Cardiology

## 2019-07-13 NOTE — Patient Instructions (Signed)
Entered in error

## 2019-11-06 ENCOUNTER — Encounter (HOSPITAL_COMMUNITY): Payer: Self-pay | Admitting: *Deleted

## 2019-11-06 ENCOUNTER — Emergency Department (HOSPITAL_COMMUNITY): Payer: Medicare Other

## 2019-11-06 ENCOUNTER — Other Ambulatory Visit: Payer: Self-pay

## 2019-11-06 ENCOUNTER — Observation Stay (HOSPITAL_COMMUNITY)
Admission: EM | Admit: 2019-11-06 | Discharge: 2019-11-07 | Disposition: A | Discharge status: Home / Self Care | Payer: Medicare Other | Attending: Internal Medicine | Admitting: Internal Medicine

## 2019-11-06 DIAGNOSIS — Z23 Encounter for immunization: Secondary | ICD-10-CM | POA: Insufficient documentation

## 2019-11-06 DIAGNOSIS — I130 Hypertensive heart and chronic kidney disease with heart failure and stage 1 through stage 4 chronic kidney disease, or unspecified chronic kidney disease: Secondary | ICD-10-CM | POA: Diagnosis not present

## 2019-11-06 DIAGNOSIS — I25110 Atherosclerotic heart disease of native coronary artery with unstable angina pectoris: Secondary | ICD-10-CM | POA: Insufficient documentation

## 2019-11-06 DIAGNOSIS — R0602 Shortness of breath: Secondary | ICD-10-CM | POA: Diagnosis present

## 2019-11-06 DIAGNOSIS — N3000 Acute cystitis without hematuria: Secondary | ICD-10-CM | POA: Diagnosis not present

## 2019-11-06 DIAGNOSIS — Z20828 Contact with and (suspected) exposure to other viral communicable diseases: Secondary | ICD-10-CM | POA: Diagnosis not present

## 2019-11-06 DIAGNOSIS — I428 Other cardiomyopathies: Secondary | ICD-10-CM | POA: Diagnosis not present

## 2019-11-06 DIAGNOSIS — I429 Cardiomyopathy, unspecified: Secondary | ICD-10-CM | POA: Insufficient documentation

## 2019-11-06 DIAGNOSIS — I5042 Chronic combined systolic (congestive) and diastolic (congestive) heart failure: Secondary | ICD-10-CM | POA: Insufficient documentation

## 2019-11-06 DIAGNOSIS — F1910 Other psychoactive substance abuse, uncomplicated: Secondary | ICD-10-CM | POA: Diagnosis not present

## 2019-11-06 DIAGNOSIS — Z79899 Other long term (current) drug therapy: Secondary | ICD-10-CM | POA: Diagnosis not present

## 2019-11-06 DIAGNOSIS — F1020 Alcohol dependence, uncomplicated: Secondary | ICD-10-CM | POA: Diagnosis not present

## 2019-11-06 DIAGNOSIS — Z7982 Long term (current) use of aspirin: Secondary | ICD-10-CM | POA: Diagnosis not present

## 2019-11-06 DIAGNOSIS — R0789 Other chest pain: Secondary | ICD-10-CM

## 2019-11-06 DIAGNOSIS — E876 Hypokalemia: Secondary | ICD-10-CM | POA: Diagnosis not present

## 2019-11-06 DIAGNOSIS — N182 Chronic kidney disease, stage 2 (mild): Secondary | ICD-10-CM | POA: Diagnosis not present

## 2019-11-06 LAB — CBC
HCT: 47.5 % (ref 39.0–52.0)
Hemoglobin: 15.7 g/dL (ref 13.0–17.0)
MCH: 29.6 pg (ref 26.0–34.0)
MCHC: 33.1 g/dL (ref 30.0–36.0)
MCV: 89.6 fL (ref 80.0–100.0)
Platelets: 202 10*3/uL (ref 150–400)
RBC: 5.3 MIL/uL (ref 4.22–5.81)
RDW: 13.3 % (ref 11.5–15.5)
WBC: 6 10*3/uL (ref 4.0–10.5)
nRBC: 0 % (ref 0.0–0.2)

## 2019-11-06 LAB — COMPREHENSIVE METABOLIC PANEL
ALT: 23 U/L (ref 0–44)
AST: 33 U/L (ref 15–41)
Albumin: 3.3 g/dL — ABNORMAL LOW (ref 3.5–5.0)
Alkaline Phosphatase: 59 U/L (ref 38–126)
Anion gap: 12 (ref 5–15)
BUN: 17 mg/dL (ref 8–23)
CO2: 23 mmol/L (ref 22–32)
Calcium: 9.1 mg/dL (ref 8.9–10.3)
Chloride: 101 mmol/L (ref 98–111)
Creatinine, Ser: 1.28 mg/dL — ABNORMAL HIGH (ref 0.61–1.24)
GFR calc Af Amer: >60 mL/min (ref >60)
GFR calc non Af Amer: 52 mL/min — ABNORMAL LOW (ref >60)
Glucose, Bld: 117 mg/dL — ABNORMAL HIGH (ref 70–99)
Potassium: 3.1 mmol/L — ABNORMAL LOW (ref 3.5–5.1)
Sodium: 136 mmol/L (ref 135–145)
Total Bilirubin: 0.7 mg/dL (ref 0.3–1.2)
Total Protein: 7.4 g/dL (ref 6.5–8.1)

## 2019-11-06 LAB — URINALYSIS, ROUTINE W REFLEX MICROSCOPIC
Bilirubin Urine: NEGATIVE
Glucose, UA: NEGATIVE mg/dL
Hgb urine dipstick: NEGATIVE
Ketones, ur: NEGATIVE mg/dL
Nitrite: POSITIVE — AB
Protein, ur: 30 mg/dL — AB
Specific Gravity, Urine: 1.014 (ref 1.005–1.030)
WBC, UA: >50 WBC/hpf — ABNORMAL HIGH (ref 0–5)
pH: 5 (ref 5.0–8.0)

## 2019-11-06 LAB — TROPONIN I (HIGH SENSITIVITY)
Troponin I (High Sensitivity): 18 ng/L — ABNORMAL HIGH (ref <18)
Troponin I (High Sensitivity): 18 ng/L — ABNORMAL HIGH (ref <18)

## 2019-11-06 LAB — BRAIN NATRIURETIC PEPTIDE: B Natriuretic Peptide: 199.2 pg/mL — ABNORMAL HIGH (ref 0.0–100.0)

## 2019-11-06 LAB — CBG MONITORING, ED: Glucose-Capillary: 79 mg/dL (ref 70–99)

## 2019-11-06 MED ORDER — SODIUM CHLORIDE 0.9 % IV SOLN
1.0000 g | INTRAVENOUS | Status: DC
  Filled 2019-11-06: qty 10

## 2019-11-06 MED ORDER — FUROSEMIDE 40 MG PO TABS
40.0000 mg | ORAL_TABLET | Freq: Every day | ORAL | Status: DC
  Administered 2019-11-07: 40 mg via ORAL
  Filled 2019-11-06: qty 1

## 2019-11-06 MED ORDER — SODIUM CHLORIDE 0.9% FLUSH: 3.0000 mL | INTRAVENOUS | Status: DC | PRN

## 2019-11-06 MED ORDER — THIAMINE HCL 100 MG/ML IJ SOLN
100.0000 mg | Freq: Every day | INTRAMUSCULAR | Status: DC
  Filled 2019-11-06 (×2): qty 2

## 2019-11-06 MED ORDER — ACETAMINOPHEN 325 MG PO TABS: 650.0000 mg | ORAL_TABLET | Freq: Four times a day (QID) | ORAL | Status: DC | PRN

## 2019-11-06 MED ORDER — ISOSORBIDE MONONITRATE ER 30 MG PO TB24
15.0000 mg | ORAL_TABLET | Freq: Every day | ORAL | Status: DC
  Administered 2019-11-07: 15 mg via ORAL
  Filled 2019-11-06: qty 1

## 2019-11-06 MED ORDER — LISINOPRIL 2.5 MG PO TABS
2.5000 mg | ORAL_TABLET | Freq: Every day | ORAL | Status: DC
  Administered 2019-11-07: 2.5 mg via ORAL
  Filled 2019-11-06: qty 1

## 2019-11-06 MED ORDER — ONDANSETRON HCL 4 MG/2ML IJ SOLN: 4.0000 mg | Freq: Four times a day (QID) | INTRAMUSCULAR | Status: DC | PRN

## 2019-11-06 MED ORDER — LORAZEPAM 1 MG PO TABS: 0.0000 mg | ORAL_TABLET | Freq: Two times a day (BID) | ORAL | Status: DC

## 2019-11-06 MED ORDER — LORAZEPAM 2 MG/ML IJ SOLN: 1.0000 mg | INTRAMUSCULAR | Status: DC | PRN

## 2019-11-06 MED ORDER — SODIUM CHLORIDE 0.9 % IV SOLN
1.0000 g | Freq: Once | INTRAVENOUS | Status: AC
  Administered 2019-11-06: 1 g via INTRAVENOUS
  Filled 2019-11-06: qty 10

## 2019-11-06 MED ORDER — HYDROCODONE-ACETAMINOPHEN 5-325 MG PO TABS: 1.0000 | ORAL_TABLET | ORAL | Status: DC | PRN

## 2019-11-06 MED ORDER — ASPIRIN 81 MG PO CHEW: 324.0000 mg | CHEWABLE_TABLET | Freq: Once | ORAL | Status: DC

## 2019-11-06 MED ORDER — NITROGLYCERIN 0.4 MG SL SUBL: 0.4000 mg | SUBLINGUAL_TABLET | SUBLINGUAL | Status: DC | PRN

## 2019-11-06 MED ORDER — SENNOSIDES-DOCUSATE SODIUM 8.6-50 MG PO TABS: 1.0000 | ORAL_TABLET | Freq: Every evening | ORAL | Status: DC | PRN

## 2019-11-06 MED ORDER — SODIUM CHLORIDE 0.9 % IV SOLN: 250.0000 mL | INTRAVENOUS | Status: DC | PRN

## 2019-11-06 MED ORDER — ASPIRIN EC 81 MG PO TBEC
81.0000 mg | DELAYED_RELEASE_TABLET | Freq: Every day | ORAL | Status: DC
  Administered 2019-11-07: 81 mg via ORAL
  Filled 2019-11-06: qty 1

## 2019-11-06 MED ORDER — SODIUM CHLORIDE 0.9% FLUSH: 3.0000 mL | Freq: Once | INTRAVENOUS | Status: DC

## 2019-11-06 MED ORDER — ADULT MULTIVITAMIN W/MINERALS CH
1.0000 | ORAL_TABLET | Freq: Every day | ORAL | Status: DC
  Administered 2019-11-06 – 2019-11-07 (×2): 1 via ORAL
  Filled 2019-11-06 (×2): qty 1

## 2019-11-06 MED ORDER — SODIUM CHLORIDE 0.9% FLUSH
3.0000 mL | Freq: Two times a day (BID) | INTRAVENOUS | Status: DC
  Administered 2019-11-07: 3 mL via INTRAVENOUS

## 2019-11-06 MED ORDER — FOLIC ACID 1 MG PO TABS
1.0000 mg | ORAL_TABLET | Freq: Every day | ORAL | Status: DC
  Administered 2019-11-06 – 2019-11-07 (×2): 1 mg via ORAL
  Filled 2019-11-06 (×2): qty 1

## 2019-11-06 MED ORDER — ACETAMINOPHEN 650 MG RE SUPP: 650.0000 mg | Freq: Four times a day (QID) | RECTAL | Status: DC | PRN

## 2019-11-06 MED ORDER — ONDANSETRON HCL 4 MG PO TABS: 4.0000 mg | ORAL_TABLET | Freq: Four times a day (QID) | ORAL | Status: DC | PRN

## 2019-11-06 MED ORDER — LORAZEPAM 1 MG PO TABS: 0.0000 mg | ORAL_TABLET | Freq: Four times a day (QID) | ORAL | Status: DC

## 2019-11-06 MED ORDER — LORAZEPAM 1 MG PO TABS: 1.0000 mg | ORAL_TABLET | ORAL | Status: DC | PRN

## 2019-11-06 MED ORDER — SODIUM CHLORIDE 0.9% FLUSH: 3.0000 mL | Freq: Two times a day (BID) | INTRAVENOUS | Status: DC

## 2019-11-06 MED ORDER — POTASSIUM CHLORIDE CRYS ER 20 MEQ PO TBCR
40.0000 meq | EXTENDED_RELEASE_TABLET | Freq: Once | ORAL | Status: AC
  Administered 2019-11-06: 40 meq via ORAL
  Filled 2019-11-06: qty 2

## 2019-11-06 MED ORDER — VITAMIN B-1 100 MG PO TABS
100.0000 mg | ORAL_TABLET | Freq: Every day | ORAL | Status: DC
  Administered 2019-11-06 – 2019-11-07 (×2): 100 mg via ORAL
  Filled 2019-11-06 (×2): qty 1

## 2019-11-06 MED ORDER — ENOXAPARIN SODIUM 40 MG/0.4ML ~~LOC~~ SOLN
40.0000 mg | Freq: Every day | SUBCUTANEOUS | Status: DC
  Administered 2019-11-07: 40 mg via SUBCUTANEOUS
  Filled 2019-11-06: qty 0.4

## 2019-11-06 MED ORDER — MAGNESIUM SULFATE IN D5W 1-5 GM/100ML-% IV SOLN
1.0000 g | Freq: Once | INTRAVENOUS | Status: AC
  Administered 2019-11-06: 1 g via INTRAVENOUS
  Filled 2019-11-06: qty 100
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
## 2019-11-06 NOTE — ED Notes (Signed)
Attempted to call report to [phone] 
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
## 2019-11-07 ENCOUNTER — Observation Stay (HOSPITAL_BASED_OUTPATIENT_CLINIC_OR_DEPARTMENT_OTHER): Payer: Medicare Other

## 2019-11-07 DIAGNOSIS — E876 Hypokalemia: Secondary | ICD-10-CM | POA: Diagnosis not present

## 2019-11-07 DIAGNOSIS — I340 Nonrheumatic mitral (valve) insufficiency: Secondary | ICD-10-CM | POA: Diagnosis not present

## 2019-11-07 DIAGNOSIS — F1910 Other psychoactive substance abuse, uncomplicated: Secondary | ICD-10-CM | POA: Diagnosis not present

## 2019-11-07 DIAGNOSIS — N182 Chronic kidney disease, stage 2 (mild): Secondary | ICD-10-CM | POA: Diagnosis not present

## 2019-11-07 DIAGNOSIS — R0789 Other chest pain: Secondary | ICD-10-CM

## 2019-11-07 HISTORY — PX: TRANSTHORACIC ECHOCARDIOGRAM: SHX275

## 2019-11-07 LAB — BASIC METABOLIC PANEL
Anion gap: 9 (ref 5–15)
BUN: 14 mg/dL (ref 8–23)
CO2: 24 mmol/L (ref 22–32)
Calcium: 8.9 mg/dL (ref 8.9–10.3)
Chloride: 109 mmol/L (ref 98–111)
Creatinine, Ser: 1.33 mg/dL — ABNORMAL HIGH (ref 0.61–1.24)
GFR calc Af Amer: 58 mL/min — ABNORMAL LOW (ref >60)
GFR calc non Af Amer: 50 mL/min — ABNORMAL LOW (ref >60)
Glucose, Bld: 128 mg/dL — ABNORMAL HIGH (ref 70–99)
Potassium: 3.5 mmol/L (ref 3.5–5.1)
Sodium: 142 mmol/L (ref 135–145)

## 2019-11-07 LAB — ECHOCARDIOGRAM COMPLETE
Height: 66 in
Weight: 2392 oz

## 2019-11-07 LAB — CBC
HCT: 44 % (ref 39.0–52.0)
Hemoglobin: 14.6 g/dL (ref 13.0–17.0)
MCH: 29.7 pg (ref 26.0–34.0)
MCHC: 33.2 g/dL (ref 30.0–36.0)
MCV: 89.6 fL (ref 80.0–100.0)
Platelets: 188 10*3/uL (ref 150–400)
RBC: 4.91 MIL/uL (ref 4.22–5.81)
RDW: 13.4 % (ref 11.5–15.5)
WBC: 5.7 10*3/uL (ref 4.0–10.5)
nRBC: 0 % (ref 0.0–0.2)

## 2019-11-07 LAB — SARS CORONAVIRUS 2 (TAT 6-24 HRS): SARS Coronavirus 2: NEGATIVE

## 2019-11-07 LAB — MAGNESIUM: Magnesium: 2 mg/dL (ref 1.7–2.4)

## 2019-11-07 MED ORDER — PNEUMOCOCCAL VAC POLYVALENT 25 MCG/0.5ML IJ INJ
0.5000 mL | INJECTION | Freq: Once | INTRAMUSCULAR | Status: AC
  Administered 2019-11-07: 0.5 mL via INTRAMUSCULAR

## 2019-11-07 MED ORDER — INFLUENZA VAC A&B SA ADJ QUAD 0.5 ML IM PRSY
0.5000 mL | PREFILLED_SYRINGE | Freq: Once | INTRAMUSCULAR | Status: AC
  Administered 2019-11-07: 0.5 mL via INTRAMUSCULAR
  Filled 2019-11-07: qty 0.5

## 2019-11-07 MED ORDER — PNEUMOCOCCAL VAC POLYVALENT 25 MCG/0.5ML IJ INJ: 0.5000 mL | INJECTION | INTRAMUSCULAR | Status: DC

## 2019-11-07 MED ORDER — CEPHALEXIN 500 MG PO CAPS: 500.0000 mg | ORAL_CAPSULE | Freq: Two times a day (BID) | ORAL | 0 refills | Status: AC

## 2019-11-07 MED ORDER — INFLUENZA VAC A&B SA ADJ QUAD 0.5 ML IM PRSY: 0.5000 mL | PREFILLED_SYRINGE | INTRAMUSCULAR | Status: DC

## 2019-11-07 NOTE — Progress Notes (Signed)
Pt educated importance of fall prevention safety plan with understanding but refused to place the bed alarm on. Pt stated " I will call if I need help and I'm not going to fall." Call bell and urinal are within reach. Will continue to monitor pt.

## 2019-11-07 NOTE — Progress Notes (Signed)
  Echocardiogram 2D Echocardiogram has been performed.  Billy Parker 11/07/2019, 9:13 AM

## 2019-11-07 NOTE — Discharge Summary (Signed)
Physician Discharge Summary  Billy Parker [id number] DOB: 11-11-1936 DOA: 11/06/2019  PCP: Nolene Ebbs, MD  Admit date: 11/06/2019 Discharge date: 11/07/2019  Admitted From: home Discharge disposition: home   Recommendations for Outpatient Follow-Up:   1. Cocaine and alcohol cessation encouraged 2. Outpatient cardiology follow up    Discharge Diagnosis:   Active Problems:   CKD (chronic kidney disease), stage II   Alcohol dependence (HCC)   Hypokalemia   Nonischemic cardiomyopathy (HCC)   Polysubstance abuse (HCC)   Shortness of breath    Discharge Condition: Improved.  Diet recommendation: Low sodium, heart healthy  Wound care: None.  Code status: Full.   History of Present Illness:   Billy Parker is a 83 y.o. male with medical history significant for polysubstance abuse, alcohol dependence, nonischemic cardiomyopathy with EF 40 to 45% last year, chronic kidney disease stage II, and hypertension, now presenting to the emergency department with shortness of breath and dizziness.  Patient reports developing shortness of breath yesterday with occasional cough and lightheadedness.  He denies any chest pain or fevers associated with this.  Reports that his legs have been swollen a few days ago but not now.  Denies any leg tenderness, chest pain, or hemoptysis.  Reports that he has had some dysuria for close to a month without fever or flank pain. He acknowledges ongoing cocaine abuse, last use early am of 11/05/19. He drinks a 40 oz beer daily.    Hospital Course by Problem:   Shortness of breath; lightheadedness  - Presents with SOB and lightheadedness that began the day prior after using cocaine at a party  - He denies chest pain, troponin slightly elevated and flat  - CXR is clear and there is no hypoxia or tachypnea  - No fever or leukocytosis to suggest infectious etiology  -echo similar to prior and patient's symptoms have resolved  Nonischemic  cardiomyopathy; chronic combined systolic & diastolic CHF  - Appears compensated  - EF 40-45% in July 2019 with diffuse HK, grade 2 diastolic dysfunction, mile LAE, mild AI, mild TR, and moderate MR  - Continue Lasix and lisinopril     Polysubstance abuse; alcohol dependence  - Patient admits to ongoing/recent cocaine use and excessive daily EtOH use  - He plans to stop the illicit substances, was counseled regarding alcohol use   CKD stage II  - SCr is 1.28 on admission, appears close to baseline   Hypokalemia  - Serum potassium is 3.1 on admission  - repleated   UTI  - Patient reports close to a month of dysuria and UA is supportive of infection  - Treated with Rocephin in ED  - culture never sent -change to PO keflex for d/c -PCP to follow to ensure resolution     Medical Consultants:      Discharge Exam:   Vitals:   11/07/19 1300 11/07/19 1333  BP:  124/84  Pulse: 65 67  Resp:    Temp:    SpO2: 94% 96%   Vitals:   11/07/19 1100 11/07/19 1200 11/07/19 1300 11/07/19 1333  BP:    124/84  Pulse: (!) 33 (!) 40 65 67  Resp:      Temp:      TempSrc:      SpO2: 95% 96% 94% 96%  Weight:      Height:        General exam: Appears calm and comfortable.   The results of significant diagnostics from this hospitalization (including  imaging, microbiology, ancillary and laboratory) are listed below for reference.     Procedures and Diagnostic Studies:   Dg Chest Portable 1 View  Result Date: 11/06/2019 CLINICAL DATA:  Patient with dizziness and shortness of breath. EXAM: PORTABLE CHEST 1 VIEW COMPARISON:  Chest radiograph 07/09/2018 FINDINGS: Normal cardiac and mediastinal contours. No consolidative pulmonary opacities. No pleural effusion or pneumothorax. Regional skeleton is unremarkable. IMPRESSION: No acute cardiopulmonary process. Electronically Signed   By: Lovey Newcomer M.D.   On: 11/06/2019 13:02     Labs:   Basic Metabolic Panel: Recent Labs  Lab  11/06/19 1239 11/07/19 0319  NA 136 142  K 3.1* 3.5  CL 101 109  CO2 23 24  GLUCOSE 117* 128*  BUN 17 14  CREATININE 1.28* 1.33*  CALCIUM 9.1 8.9  MG  --  2.0   GFR Estimated Creatinine Clearance: 39.3 mL/min (A) (by C-G formula based on SCr of 1.33 mg/dL (H)). Liver Function Tests: Recent Labs  Lab 11/06/19 1239  AST 33  ALT 23  ALKPHOS 59  BILITOT 0.7  PROT 7.4  ALBUMIN 3.3*   No results for input(s): LIPASE, AMYLASE in the last 168 hours. No results for input(s): AMMONIA in the last 168 hours. Coagulation profile No results for input(s): INR, PROTIME in the last 168 hours.  CBC: Recent Labs  Lab 11/06/19 1239 11/07/19 0319  WBC 6.0 5.7  HGB 15.7 14.6  HCT 47.5 44.0  MCV 89.6 89.6  PLT 202 188   Cardiac Enzymes: No results for input(s): CKTOTAL, CKMB, CKMBINDEX, TROPONINI in the last 168 hours. BNP: Invalid input(s): POCBNP CBG: Recent Labs  Lab 11/06/19 2003  GLUCAP 79   D-Dimer No results for input(s): DDIMER in the last 72 hours. Hgb A1c No results for input(s): HGBA1C in the last 72 hours. Lipid Profile No results for input(s): CHOL, HDL, LDLCALC, TRIG, CHOLHDL, LDLDIRECT in the last 72 hours. Thyroid function studies No results for input(s): TSH, T4TOTAL, T3FREE, THYROIDAB in the last 72 hours.  Invalid input(s): FREET3 Anemia work up No results for input(s): VITAMINB12, FOLATE, FERRITIN, TIBC, IRON, RETICCTPCT in the last 72 hours. Microbiology Recent Results (from the past 240 hour(s))  SARS CORONAVIRUS 2 (TAT 6-24 HRS) Nasopharyngeal Nasopharyngeal Swab     Status: None   Collection Time: 11/06/19  6:42 PM   Specimen: Nasopharyngeal Swab  Result Value Ref Range Status   SARS Coronavirus 2 NEGATIVE NEGATIVE Final    Comment: (NOTE) SARS-CoV-2 target nucleic acids are NOT DETECTED. The SARS-CoV-2 RNA is generally detectable in upper and lower respiratory specimens during the acute phase of infection. Negative results do not preclude  SARS-CoV-2 infection, do not rule out co-infections with other pathogens, and should not be used as the sole basis for treatment or other patient management decisions. Negative results must be combined with clinical observations, patient history, and epidemiological information. The expected result is Negative. Fact Sheet for Patients: SugarRoll.be Fact Sheet for Healthcare Providers: https://www.woods-mathews.com/ This test is not yet approved or cleared by the [place] FDA and  has been authorized for detection and/or diagnosis of SARS-CoV-2 by FDA under an Emergency Use Authorization (EUA). This EUA will remain  in effect (meaning this test can be used) for the duration of the COVID-19 declaration under Section 56 4(b)(1) of the Act, 21 U.S.C. section 360bbb-3(b)(1), unless the authorization is terminated or revoked sooner. Performed at [facility], [place] [address] [place] [employer] [place], [place] [address]      Discharge Instructions:  Discharge Instructions    Diet - low sodium heart healthy   Complete by: As directed    Increase activity slowly   Complete by: As directed      Allergies as of 11/07/2019      Reactions   Other Swelling   Antibiotic (name not recalled by the patient) = both legs became swollen      Medication List    STOP taking these medications   potassium chloride 10 MEQ tablet Commonly known as: KLOR-CON     TAKE these medications   albuterol 108 (90 Base) MCG/ACT inhaler Commonly known as: VENTOLIN HFA Inhale 2 puffs into the lungs every 6 (six) hours as needed for wheezing or shortness of breath.   aspirin 81 MG EC tablet Take 1 tablet (81 mg total) by mouth daily.   cephALEXin 500 MG capsule Commonly known as: KEFLEX Take 1 capsule (500 mg total) by mouth 2 (two) times daily for 14 doses.   furosemide 40 MG tablet Commonly known as: LASIX Take 1 tablet (40 mg total) by mouth daily.    isosorbide mononitrate 30 MG 24 hr tablet Commonly known as: IMDUR Take 0.5 tablets (15 mg total) by mouth daily.   lisinopril 2.5 MG tablet Commonly known as: ZESTRIL Take 1 tablet (2.5 mg total) by mouth daily.   multivitamin with minerals Tabs tablet Take 1 tablet by mouth daily.   potassium chloride 20 MEQ packet Commonly known as: KLOR-CON Take 20 mEq by mouth daily.      Follow-up Information    Nolene Ebbs, MD Follow up in 1 week(s).   Specialty: Internal Medicine Contact information: [place] [address] [phone]        Leonie Man, MD. Schedule an appointment as soon as possible for a visit.   Specialty: Cardiology Contact information: [address] [place] [address] [phone]            Time coordinating discharge: 25 min  Signed:  Geradine Girt DO  [facility] 11/07/2019, 4:10 PM

## 2020-01-21 ENCOUNTER — Ambulatory Visit: Payer: Medicare Other

## 2020-02-26 ENCOUNTER — Ambulatory Visit: Payer: Medicare Other | Attending: Internal Medicine

## 2020-03-03 ENCOUNTER — Ambulatory Visit: Payer: Medicare Other

## 2020-03-06 ENCOUNTER — Ambulatory Visit: Payer: Medicare Other

## 2020-03-14 ENCOUNTER — Ambulatory Visit: Payer: Medicare Other

## 2020-03-24 ENCOUNTER — Ambulatory Visit: Payer: Medicare Other | Attending: Internal Medicine

## 2020-03-24 DIAGNOSIS — Z23 Encounter for immunization: Secondary | ICD-10-CM

## 2020-03-24 NOTE — Progress Notes (Signed)
   Covid-19 Vaccination Clinic  Name:  Billy Parker    MRN: [id number] DOB: 08-22-1936  03/24/2020  Billy Parker was observed post Covid-19 immunization for 15 minutes without incident. He was provided with Vaccine Information Sheet and instruction to access the V-Safe system.   Billy Parker was instructed to call 911 with any severe reactions post vaccine: Marland Kitchen Difficulty breathing  . Swelling of face and throat  . A fast heartbeat  . A bad rash all over body  . Dizziness and weakness   Immunizations Administered    Name Date Dose VIS Date Route   Pfizer COVID-19 Vaccine 03/24/2020  3:24 PM 0.3 mL 12/11/2019 Intramuscular   Manufacturer: [place]   Lot: [id number]   [place]: [id number]

## 2020-04-18 ENCOUNTER — Ambulatory Visit: Payer: Medicare Other | Attending: Internal Medicine

## 2020-04-18 DIAGNOSIS — Z23 Encounter for immunization: Secondary | ICD-10-CM

## 2020-04-18 NOTE — Progress Notes (Signed)
   Covid-19 Vaccination Clinic  Name:  Holmes Pilotti    MRN: [id number] DOB: 10/19/1936  04/18/2020  Mr. Rootes was observed post Covid-19 immunization for 15 minutes without incident. He was provided with Vaccine Information Sheet and instruction to access the V-Safe system.   Mr. Remmick was instructed to call 911 with any severe reactions post vaccine: Marland Kitchen Difficulty breathing  . Swelling of face and throat  . A fast heartbeat  . A bad rash all over body  . Dizziness and weakness   Immunizations Administered    Name Date Dose VIS Date Route   Pfizer COVID-19 Vaccine 04/18/2020  2:51 PM 0.3 mL 02/24/2019 Intramuscular   Manufacturer: [place]   Lot: [id number]   [place]: [id number]

## 2020-08-12 ENCOUNTER — Encounter (HOSPITAL_COMMUNITY): Payer: Self-pay

## 2020-08-12 ENCOUNTER — Inpatient Hospital Stay (HOSPITAL_COMMUNITY)
Admission: EM | Admit: 2020-08-12 | Discharge: 2020-08-16 | DRG: 713 | Disposition: A | Discharge status: Home / Self Care | Payer: Medicare Other | Attending: Urology | Admitting: Urology

## 2020-08-12 DIAGNOSIS — I5032 Chronic diastolic (congestive) heart failure: Secondary | ICD-10-CM | POA: Diagnosis present

## 2020-08-12 DIAGNOSIS — N2889 Other specified disorders of kidney and ureter: Secondary | ICD-10-CM

## 2020-08-12 DIAGNOSIS — N410 Acute prostatitis: Secondary | ICD-10-CM | POA: Diagnosis not present

## 2020-08-12 DIAGNOSIS — Z7982 Long term (current) use of aspirin: Secondary | ICD-10-CM

## 2020-08-12 DIAGNOSIS — N412 Abscess of prostate: Secondary | ICD-10-CM | POA: Diagnosis not present

## 2020-08-12 DIAGNOSIS — J45909 Unspecified asthma, uncomplicated: Secondary | ICD-10-CM | POA: Diagnosis present

## 2020-08-12 DIAGNOSIS — Z79899 Other long term (current) drug therapy: Secondary | ICD-10-CM

## 2020-08-12 DIAGNOSIS — B9620 Unspecified Escherichia coli [E. coli] as the cause of diseases classified elsewhere: Secondary | ICD-10-CM | POA: Diagnosis present

## 2020-08-12 DIAGNOSIS — I2510 Atherosclerotic heart disease of native coronary artery without angina pectoris: Secondary | ICD-10-CM | POA: Diagnosis present

## 2020-08-12 DIAGNOSIS — I110 Hypertensive heart disease with heart failure: Secondary | ICD-10-CM | POA: Diagnosis present

## 2020-08-12 DIAGNOSIS — C641 Malignant neoplasm of right kidney, except renal pelvis: Secondary | ICD-10-CM | POA: Diagnosis present

## 2020-08-12 DIAGNOSIS — K644 Residual hemorrhoidal skin tags: Secondary | ICD-10-CM | POA: Diagnosis present

## 2020-08-12 DIAGNOSIS — I428 Other cardiomyopathies: Secondary | ICD-10-CM | POA: Diagnosis present

## 2020-08-12 DIAGNOSIS — Z20822 Contact with and (suspected) exposure to covid-19: Secondary | ICD-10-CM | POA: Diagnosis present

## 2020-08-12 LAB — COMPREHENSIVE METABOLIC PANEL
ALT: 15 U/L (ref 0–44)
AST: 27 U/L (ref 15–41)
Albumin: 2.6 g/dL — ABNORMAL LOW (ref 3.5–5.0)
Alkaline Phosphatase: 64 U/L (ref 38–126)
Anion gap: 12 (ref 5–15)
BUN: 20 mg/dL (ref 8–23)
CO2: 21 mmol/L — ABNORMAL LOW (ref 22–32)
Calcium: 8.9 mg/dL (ref 8.9–10.3)
Chloride: 102 mmol/L (ref 98–111)
Creatinine, Ser: 1.28 mg/dL — ABNORMAL HIGH (ref 0.61–1.24)
GFR calc Af Amer: >60 mL/min (ref >60)
GFR calc non Af Amer: 52 mL/min — ABNORMAL LOW (ref >60)
Glucose, Bld: 125 mg/dL — ABNORMAL HIGH (ref 70–99)
Potassium: 4 mmol/L (ref 3.5–5.1)
Sodium: 135 mmol/L (ref 135–145)
Total Bilirubin: 1 mg/dL (ref 0.3–1.2)
Total Protein: 7.6 g/dL (ref 6.5–8.1)

## 2020-08-12 LAB — LIPASE, BLOOD: Lipase: 64 U/L — ABNORMAL HIGH (ref 11–51)

## 2020-08-12 LAB — CBC
HCT: 45.2 % (ref 39.0–52.0)
Hemoglobin: 14.4 g/dL (ref 13.0–17.0)
MCH: 28.1 pg (ref 26.0–34.0)
MCHC: 31.9 g/dL (ref 30.0–36.0)
MCV: 88.3 fL (ref 80.0–100.0)
Platelets: 291 10*3/uL (ref 150–400)
RBC: 5.12 MIL/uL (ref 4.22–5.81)
RDW: 13.4 % (ref 11.5–15.5)
WBC: 10 10*3/uL (ref 4.0–10.5)
nRBC: 0 % (ref 0.0–0.2)

## 2020-08-12 NOTE — ED Triage Notes (Signed)
Pt arrives POV for eval of lower abd pain x 2 weeks. Reports dysuria and pain when having BMs. Denies N/V, states unsure if he has hematuria or melena "pays no attention"

## 2020-08-13 ENCOUNTER — Other Ambulatory Visit: Payer: Self-pay

## 2020-08-13 ENCOUNTER — Emergency Department (HOSPITAL_COMMUNITY): Payer: Medicare Other

## 2020-08-13 ENCOUNTER — Inpatient Hospital Stay (HOSPITAL_COMMUNITY): Payer: Medicare Other | Admitting: Anesthesiology

## 2020-08-13 ENCOUNTER — Encounter (HOSPITAL_COMMUNITY)
Admission: EM | Disposition: A | Discharge status: Home / Self Care | Payer: Self-pay | Source: Home / Self Care | Attending: Urology

## 2020-08-13 ENCOUNTER — Encounter (HOSPITAL_COMMUNITY): Payer: Self-pay | Admitting: Urology

## 2020-08-13 DIAGNOSIS — I5032 Chronic diastolic (congestive) heart failure: Secondary | ICD-10-CM | POA: Diagnosis present

## 2020-08-13 DIAGNOSIS — J45909 Unspecified asthma, uncomplicated: Secondary | ICD-10-CM | POA: Diagnosis present

## 2020-08-13 DIAGNOSIS — I110 Hypertensive heart disease with heart failure: Secondary | ICD-10-CM | POA: Diagnosis present

## 2020-08-13 DIAGNOSIS — Z7982 Long term (current) use of aspirin: Secondary | ICD-10-CM | POA: Diagnosis not present

## 2020-08-13 DIAGNOSIS — I428 Other cardiomyopathies: Secondary | ICD-10-CM | POA: Diagnosis present

## 2020-08-13 DIAGNOSIS — N412 Abscess of prostate: Secondary | ICD-10-CM | POA: Diagnosis present

## 2020-08-13 DIAGNOSIS — I2510 Atherosclerotic heart disease of native coronary artery without angina pectoris: Secondary | ICD-10-CM | POA: Diagnosis present

## 2020-08-13 DIAGNOSIS — C641 Malignant neoplasm of right kidney, except renal pelvis: Secondary | ICD-10-CM | POA: Diagnosis present

## 2020-08-13 DIAGNOSIS — B9620 Unspecified Escherichia coli [E. coli] as the cause of diseases classified elsewhere: Secondary | ICD-10-CM | POA: Diagnosis present

## 2020-08-13 DIAGNOSIS — Z20822 Contact with and (suspected) exposure to covid-19: Secondary | ICD-10-CM | POA: Diagnosis present

## 2020-08-13 DIAGNOSIS — Z79899 Other long term (current) drug therapy: Secondary | ICD-10-CM | POA: Diagnosis not present

## 2020-08-13 DIAGNOSIS — K644 Residual hemorrhoidal skin tags: Secondary | ICD-10-CM | POA: Diagnosis present

## 2020-08-13 DIAGNOSIS — N410 Acute prostatitis: Secondary | ICD-10-CM | POA: Diagnosis present

## 2020-08-13 HISTORY — PX: TRANSURETHRAL RESECTION OF PROSTATE: SHX73

## 2020-08-13 LAB — RAPID URINE DRUG SCREEN, HOSP PERFORMED
Amphetamines: NOT DETECTED
Barbiturates: NOT DETECTED
Benzodiazepines: NOT DETECTED
Cocaine: NOT DETECTED
Opiates: NOT DETECTED
Tetrahydrocannabinol: NOT DETECTED

## 2020-08-13 LAB — CBC WITH DIFFERENTIAL/PLATELET
Abs Immature Granulocytes: 0.1 10*3/uL — ABNORMAL HIGH (ref 0.00–0.07)
Basophils Absolute: 0 10*3/uL (ref 0.0–0.1)
Basophils Relative: 0 %
Eosinophils Absolute: 0 10*3/uL (ref 0.0–0.5)
Eosinophils Relative: 0 %
HCT: 41.3 % (ref 39.0–52.0)
Hemoglobin: 13.3 g/dL (ref 13.0–17.0)
Immature Granulocytes: 1 %
Lymphocytes Relative: 18 %
Lymphs Abs: 1.7 10*3/uL (ref 0.7–4.0)
MCH: 28.5 pg (ref 26.0–34.0)
MCHC: 32.2 g/dL (ref 30.0–36.0)
MCV: 88.4 fL (ref 80.0–100.0)
Monocytes Absolute: 1.1 10*3/uL — ABNORMAL HIGH (ref 0.1–1.0)
Monocytes Relative: 12 %
Neutro Abs: 6.4 10*3/uL (ref 1.7–7.7)
Neutrophils Relative %: 69 %
Platelets: 276 10*3/uL (ref 150–400)
RBC: 4.67 MIL/uL (ref 4.22–5.81)
RDW: 13.8 % (ref 11.5–15.5)
WBC: 9.4 10*3/uL (ref 4.0–10.5)
nRBC: 0 % (ref 0.0–0.2)

## 2020-08-13 LAB — URINALYSIS, ROUTINE W REFLEX MICROSCOPIC
Bilirubin Urine: NEGATIVE
Glucose, UA: NEGATIVE mg/dL
Hgb urine dipstick: NEGATIVE
Ketones, ur: NEGATIVE mg/dL
Nitrite: NEGATIVE
Protein, ur: 30 mg/dL — AB
Specific Gravity, Urine: 1.015 (ref 1.005–1.030)
WBC, UA: >50 WBC/hpf — ABNORMAL HIGH (ref 0–5)
pH: 5 (ref 5.0–8.0)

## 2020-08-13 LAB — BASIC METABOLIC PANEL
Anion gap: 10 (ref 5–15)
BUN: 21 mg/dL (ref 8–23)
CO2: 19 mmol/L — ABNORMAL LOW (ref 22–32)
Calcium: 7.6 mg/dL — ABNORMAL LOW (ref 8.9–10.3)
Chloride: 108 mmol/L (ref 98–111)
Creatinine, Ser: 1.09 mg/dL (ref 0.61–1.24)
GFR calc Af Amer: >60 mL/min (ref >60)
GFR calc non Af Amer: >60 mL/min (ref >60)
Glucose, Bld: 106 mg/dL — ABNORMAL HIGH (ref 70–99)
Potassium: 4.8 mmol/L (ref 3.5–5.1)
Sodium: 137 mmol/L (ref 135–145)

## 2020-08-13 LAB — SARS CORONAVIRUS 2 BY RT PCR (HOSPITAL ORDER, PERFORMED IN ~~LOC~~ HOSPITAL LAB): SARS Coronavirus 2: NEGATIVE

## 2020-08-13 LAB — MRSA PCR SCREENING: MRSA by PCR: POSITIVE — AB

## 2020-08-13 LAB — POC OCCULT BLOOD, ED: Fecal Occult Bld: NEGATIVE

## 2020-08-13 SURGERY — TURP (TRANSURETHRAL RESECTION OF PROSTATE)
Anesthesia: General | Site: Prostate

## 2020-08-13 MED ORDER — SODIUM CHLORIDE 0.9 % IV SOLN: INTRAVENOUS | Status: DC

## 2020-08-13 MED ORDER — LISINOPRIL 5 MG PO TABS
2.5000 mg | ORAL_TABLET | Freq: Every day | ORAL | Status: DC
  Administered 2020-08-13 – 2020-08-16 (×4): 2.5 mg via ORAL
  Filled 2020-08-13 (×4): qty 1

## 2020-08-13 MED ORDER — DEXAMETHASONE SODIUM PHOSPHATE 10 MG/ML IJ SOLN
INTRAMUSCULAR | Status: AC
  Filled 2020-08-13: qty 1

## 2020-08-13 MED ORDER — PHENYLEPHRINE 40 MCG/ML (10ML) SYRINGE FOR IV PUSH (FOR BLOOD PRESSURE SUPPORT)
PREFILLED_SYRINGE | INTRAVENOUS | Status: DC | PRN
  Administered 2020-08-13 (×6): 80 ug via INTRAVENOUS

## 2020-08-13 MED ORDER — ACETAMINOPHEN 10 MG/ML IV SOLN
1000.0000 mg | Freq: Once | INTRAVENOUS | Status: AC
  Administered 2020-08-13: 1000 mg via INTRAVENOUS

## 2020-08-13 MED ORDER — MUPIROCIN 2 % EX OINT
1.0000 "application " | TOPICAL_OINTMENT | Freq: Two times a day (BID) | CUTANEOUS | Status: DC
  Administered 2020-08-13 – 2020-08-16 (×6): 1 via NASAL
  Filled 2020-08-13: qty 22

## 2020-08-13 MED ORDER — SODIUM CHLORIDE 0.9 % IR SOLN
Status: DC | PRN
  Administered 2020-08-13: 6000 mL

## 2020-08-13 MED ORDER — SODIUM CHLORIDE 0.9 % IR SOLN
Status: DC | PRN
  Administered 2020-08-13: 1000 mL

## 2020-08-13 MED ORDER — PROPOFOL 10 MG/ML IV BOLUS
INTRAVENOUS | Status: AC
  Filled 2020-08-13: qty 20

## 2020-08-13 MED ORDER — ACETAMINOPHEN 325 MG PO TABS
650.0000 mg | ORAL_TABLET | ORAL | Status: DC | PRN
  Filled 2020-08-13: qty 2

## 2020-08-13 MED ORDER — LACTATED RINGERS IV SOLN
INTRAVENOUS | Status: DC | PRN
Start: 2020-08-13 — End: 2020-08-13

## 2020-08-13 MED ORDER — CHLORHEXIDINE GLUCONATE CLOTH 2 % EX PADS
6.0000 | MEDICATED_PAD | Freq: Every day | CUTANEOUS | Status: DC
  Administered 2020-08-14 – 2020-08-16 (×3): 6 via TOPICAL

## 2020-08-13 MED ORDER — DEXAMETHASONE SODIUM PHOSPHATE 10 MG/ML IJ SOLN
INTRAMUSCULAR | Status: DC | PRN
  Administered 2020-08-13: 5 mg via INTRAVENOUS

## 2020-08-13 MED ORDER — FUROSEMIDE 40 MG PO TABS
40.0000 mg | ORAL_TABLET | Freq: Every day | ORAL | Status: DC
  Administered 2020-08-13 – 2020-08-16 (×4): 40 mg via ORAL
  Filled 2020-08-13: qty 1
  Filled 2020-08-13: qty 2
  Filled 2020-08-13 (×2): qty 1

## 2020-08-13 MED ORDER — ZOLPIDEM TARTRATE 5 MG PO TABS: 5.0000 mg | ORAL_TABLET | Freq: Every evening | ORAL | Status: DC | PRN

## 2020-08-13 MED ORDER — LIDOCAINE 2% (20 MG/ML) 5 ML SYRINGE
INTRAMUSCULAR | Status: AC
  Filled 2020-08-13: qty 5

## 2020-08-13 MED ORDER — ONDANSETRON HCL 4 MG/2ML IJ SOLN
INTRAMUSCULAR | Status: AC
  Filled 2020-08-13: qty 2

## 2020-08-13 MED ORDER — PROPOFOL 10 MG/ML IV BOLUS
INTRAVENOUS | Status: DC | PRN
  Administered 2020-08-13: 70 mg via INTRAVENOUS
  Administered 2020-08-13: 130 mg via INTRAVENOUS

## 2020-08-13 MED ORDER — DOCUSATE SODIUM 100 MG PO CAPS
100.0000 mg | ORAL_CAPSULE | Freq: Two times a day (BID) | ORAL | Status: DC
  Administered 2020-08-13 – 2020-08-16 (×7): 100 mg via ORAL
  Filled 2020-08-13 (×7): qty 1

## 2020-08-13 MED ORDER — FENTANYL CITRATE (PF) 100 MCG/2ML IJ SOLN
INTRAMUSCULAR | Status: AC
  Filled 2020-08-13: qty 2

## 2020-08-13 MED ORDER — ALBUTEROL SULFATE HFA 108 (90 BASE) MCG/ACT IN AERS
2.0000 | INHALATION_SPRAY | Freq: Four times a day (QID) | RESPIRATORY_TRACT | Status: DC | PRN
  Filled 2020-08-13: qty 6.7

## 2020-08-13 MED ORDER — FENTANYL CITRATE (PF) 100 MCG/2ML IJ SOLN: 25.0000 ug | INTRAMUSCULAR | Status: DC | PRN

## 2020-08-13 MED ORDER — EPHEDRINE 5 MG/ML INJ
INTRAVENOUS | Status: AC
  Filled 2020-08-13: qty 10

## 2020-08-13 MED ORDER — LIDOCAINE 2% (20 MG/ML) 5 ML SYRINGE
INTRAMUSCULAR | Status: DC | PRN
  Administered 2020-08-13: 60 mg via INTRAVENOUS

## 2020-08-13 MED ORDER — FENTANYL CITRATE (PF) 100 MCG/2ML IJ SOLN
INTRAMUSCULAR | Status: DC | PRN
  Administered 2020-08-13 (×2): 25 ug via INTRAVENOUS

## 2020-08-13 MED ORDER — SODIUM CHLORIDE 0.9 % IV SOLN
1.0000 g | INTRAVENOUS | Status: DC
  Administered 2020-08-13 – 2020-08-15 (×3): 1 g via INTRAVENOUS
  Filled 2020-08-13: qty 10
  Filled 2020-08-13 (×2): qty 1

## 2020-08-13 MED ORDER — EPHEDRINE SULFATE-NACL 50-0.9 MG/10ML-% IV SOSY
PREFILLED_SYRINGE | INTRAVENOUS | Status: DC | PRN
  Administered 2020-08-13: 10 mg via INTRAVENOUS

## 2020-08-13 MED ORDER — PHENYLEPHRINE 40 MCG/ML (10ML) SYRINGE FOR IV PUSH (FOR BLOOD PRESSURE SUPPORT)
PREFILLED_SYRINGE | INTRAVENOUS | Status: AC
  Filled 2020-08-13: qty 20

## 2020-08-13 MED ORDER — ACETAMINOPHEN 10 MG/ML IV SOLN
INTRAVENOUS | Status: AC
  Filled 2020-08-13: qty 100

## 2020-08-13 MED ORDER — ONDANSETRON HCL 4 MG/2ML IJ SOLN
INTRAMUSCULAR | Status: DC | PRN
  Administered 2020-08-13: 4 mg via INTRAVENOUS

## 2020-08-13 MED ORDER — SODIUM CHLORIDE 0.9 % IV SOLN
1.0000 g | Freq: Once | INTRAVENOUS | Status: AC
  Administered 2020-08-13: 1 g via INTRAVENOUS
  Filled 2020-08-13: qty 10

## 2020-08-13 MED ORDER — ISOSORBIDE MONONITRATE ER 30 MG PO TB24
15.0000 mg | ORAL_TABLET | Freq: Every day | ORAL | Status: DC
  Administered 2020-08-13 – 2020-08-16 (×4): 15 mg via ORAL
  Filled 2020-08-13 (×4): qty 1

## 2020-08-13 MED ORDER — IOHEXOL 300 MG/ML  SOLN
100.0000 mL | Freq: Once | INTRAMUSCULAR | Status: AC | PRN
  Administered 2020-08-13: 100 mL via INTRAVENOUS

## 2020-08-13 MED ORDER — ALBUTEROL SULFATE (2.5 MG/3ML) 0.083% IN NEBU: 2.5000 mg | INHALATION_SOLUTION | Freq: Four times a day (QID) | RESPIRATORY_TRACT | Status: DC | PRN

## 2020-08-13 SURGICAL SUPPLY — 19 items
BAG URINE DRAIN 2000ML AR STRL (UROLOGICAL SUPPLIES) ×3 IMPLANT
BAG URO CATCHER STRL LF (MISCELLANEOUS) ×3 IMPLANT
CATH COUNCIL 22FR (CATHETERS) ×3 IMPLANT
CATH HEMA 3WAY 30CC 22FR COUDE (CATHETERS) IMPLANT
GLOVE BIOGEL M STRL SZ7.5 (GLOVE) ×3 IMPLANT
GOWN STRL REUS W/TWL LRG LVL3 (GOWN DISPOSABLE) ×3 IMPLANT
GUIDEWIRE STR DUAL SENSOR (WIRE) ×3 IMPLANT
HOLDER FOLEY CATH W/STRAP (MISCELLANEOUS) ×3 IMPLANT
KIT TURNOVER KIT A (KITS) IMPLANT
LOOP CUT BIPOLAR 24F LRG (ELECTROSURGICAL) ×3 IMPLANT
MANIFOLD NEPTUNE II (INSTRUMENTS) ×3 IMPLANT
PACK CYSTO (CUSTOM PROCEDURE TRAY) ×3 IMPLANT
PENCIL SMOKE EVACUATOR (MISCELLANEOUS) IMPLANT
SYR 30ML LL (SYRINGE) ×3 IMPLANT
SYR TOOMEY IRRIG 70ML (MISCELLANEOUS) ×3
SYRINGE TOOMEY IRRIG 70ML (MISCELLANEOUS) ×1 IMPLANT
TUBING CONNECTING 10 (TUBING) ×2 IMPLANT
TUBING CONNECTING 10' (TUBING) ×1
TUBING UROLOGY SET (TUBING) ×3 IMPLANT

## 2020-08-13 NOTE — ED Notes (Signed)
Pt with [facility] at this time to to [facility]

## 2020-08-13 NOTE — ED Notes (Signed)
Report called to [facility], [employer]. [facility] called by Network engineer

## 2020-08-13 NOTE — Op Note (Signed)
Preoperative diagnosis: Prostatic abscess (7 cm)  Postoperative diagnosis: Prostatic abscess  Procedures: 1. Cystoscopy 2. Transurethral resection of the prostate and unroofing of prostatic abscess  Surgeon: Pryor Curia MD  Anesthesia: General  Complications: None  EBL: Minimal  Specimens: 1. Prostate chips 2. Culture of prostatic abscess  Disposition of specimens to pathology and microbiology lab  Intraoperative findings: A large pus filled prostatic abscess was noted just beyond the verumontanum. This encompassed the majority of the prostate.  Description of procedure: The patient was taken the operating room and a general anesthetic was administered. He was given preoperative antibiotics earlier in the emergency department, prepped and draped in usual sterile fashion, and placed in dorsal lithotomy. A preoperative timeout was performed.  Cystourethroscopy was attempted but the urethra could not except the 26 French resectoscope with the obturator. As such, Leander Rams sounds were used to dilate the urethra up to 32 [place]. The resectoscope was then replaced and the patient was noted to have a short prostatic urethra but a very high bladder neck. The bladder was diffusely trabeculated but without tumors or other abnormalities. The ureteral orifice ease were identified. Using loop resection, the posterior prostate was incised along with some of the lateral lobes. These prostate chips were removed for final specimen. Eventually, the prostate abscess was entered posteriorly and a copious amount of purulent fluid was obtained. This was sent for culture. The prostatic abscess was irrigated so as to remove all of the pus within it. This was opened large enough to allow adequate drainage. A 0.38 sensor guidewire was then advanced through the resectoscope and left within the bladder. I attempted to pass a 39 [place] three-way hematuria catheter over the wire but it would not advance all the  way into the bladder. As such, I placed a 39 [place] council tip catheter which was able to be advanced into the bladder. This was irrigated carefully to ensure proper position and the catheter balloon was inflated to 25 cc. The patient tolerated the procedure without complications. He was able to be awakened and transferred to recovery unit in satisfactory condition.

## 2020-08-13 NOTE — Anesthesia Procedure Notes (Signed)
Procedure Name: LMA Insertion Date/Time: 08/13/2020 1:21 PM Performed by: Anne Fu, CRNA Pre-anesthesia Checklist: Patient identified, Emergency Drugs available, Suction available, Patient being monitored and Timeout performed Patient Re-evaluated:Patient Re-evaluated prior to induction Oxygen Delivery Method: Circle system utilized Preoxygenation: Pre-oxygenation with 100% oxygen Induction Type: IV induction Ventilation: Mask ventilation without difficulty LMA: LMA inserted LMA Size: 4.0 Number of attempts: 1 Placement Confirmation: positive ETCO2 and breath sounds checked- equal and bilateral Tube secured with: Tape

## 2020-08-13 NOTE — Transfer of Care (Signed)
Immediate Anesthesia Transfer of Care Note  Patient: Billy Parker  Procedure(s) Performed: Procedure(s): TRANSURETHRAL RESECTION OF THE PROSTATE (TURP) abcess (N/A)  Patient Location: PACU  Anesthesia Type:General  Level of Consciousness:  sedated, patient cooperative and responds to stimulation  Airway & Oxygen Therapy:Patient Spontanous Breathing and Patient connected to face mask oxgen  Post-op Assessment:  Report given to PACU RN and Post -op Vital signs reviewed and stable  Post vital signs:  Reviewed and stable  Last Vitals:  Vitals:   08/13/20 1131 08/13/20 1215  BP:  134/76  Pulse:  91  Resp:  18  Temp: 36.6 C 37.2 C  SpO2:  90%    Complications: No apparent anesthesia complications

## 2020-08-13 NOTE — Anesthesia Preprocedure Evaluation (Addendum)
Anesthesia Evaluation  Patient identified by MRN, date of birth, ID band Patient awake    Reviewed: Allergy & Precautions, NPO status , Patient's Chart, lab work & pertinent test results  Airway Mallampati: II  TM Distance: >3 FB Neck ROM: Full    Dental no notable dental hx. (+) Poor Dentition, Dental Advisory Given   Pulmonary shortness of breath and with exertion,    Pulmonary exam normal breath sounds clear to auscultation       Cardiovascular hypertension, Pt. on medications + CAD and +CHF (LVEF 45-50%)  Normal cardiovascular exam+ Valvular Problems/Murmurs (moderate MR) MR  Rhythm:Regular Rate:Normal  NICM- severe LV dysfunction in 2016-2018, ventricular dysfunction has since improved  Last echo 11/2019: 1. Left ventricular ejection fraction, by visual estimation, is 45 to 50%. The left ventricle has normal function. There is moderately increased left ventricular hypertrophy.  2. Mildly dilated left ventricular internal cavity size.  3. Mild diffuse hypokinesis worse in the septum.  4. Global right ventricle has normal systolic function.The right ventricular size is normal. No increase in right ventricular wall thickness.  5. Left atrial size was normal.  6. Right atrial size was normal.  7. Moderate calcification of the mitral valve leaflet(s).  8. Moderate mitral annular calcification.  9. Moderate thickening of the mitral valve leaflet(s).  10. The mitral valve is degenerative. Moderate mitral valve regurgitation.  No evidence of mitral stenosis.  11. The tricuspid valve is normal in structure. Tricuspid valve regurgitation is not demonstrated.  12. The aortic valve was not well visualized. Aortic valve regurgitation is not visualized. sclerosis without stenosis.  13. The pulmonic valve was normal in structure. Pulmonic valve regurgitation is not visualized.  14. The inferior vena cava is normal in size with  greater than 50% respiratory variability, suggesting right atrial pressure of 3 mmHg.    Neuro/Psych negative neurological ROS  negative psych ROS   GI/Hepatic negative GI ROS, (+)     substance abuse  alcohol use and cocaine use, Hx EtOH and cocaine abuse, not recently   Endo/Other  negative endocrine ROS  Renal/GU Renal diseaseincidental centrally located right renal mass concerning for renal cell carcinoma- to be worked up at later date    Prostate abscess on CT- gradual onset of LUTS with dysuria and urinary urgency over the past 2 weeks that progressed causing him to present to the ED this morning.    Musculoskeletal  (+) Arthritis , Osteoarthritis,    Abdominal   Peds  Hematology negative hematology ROS (+) hct 41.3   Anesthesia Other Findings   Reproductive/Obstetrics negative OB ROS                            Anesthesia Physical Anesthesia Plan  ASA: III  Anesthesia Plan: General   Post-op Pain Management:    Induction: Intravenous  PONV Risk Score and Plan: 3 and Ondansetron, Dexamethasone and Treatment may vary due to age or medical condition  Airway Management Planned: Oral ETT  Additional Equipment: None  Intra-op Plan:   Post-operative Plan: Extubation in OR  Informed Consent: I have reviewed the patients History and Physical, chart, labs and discussed the procedure including the risks, benefits and alternatives for the proposed anesthesia with the patient or authorized representative who has indicated his/her understanding and acceptance.     Dental advisory given  Plan Discussed with: CRNA  Anesthesia Plan Comments:        Anesthesia Quick Evaluation
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
## 2020-08-13 NOTE — ED Notes (Signed)
The pt is being admitted to [facility] long hospital

## 2020-08-13 NOTE — ED Notes (Signed)
Attempted to call report, nurse unavailable at this time.
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
## 2020-08-13 NOTE — Anesthesia Postprocedure Evaluation (Signed)
Anesthesia Post Note  Patient: Billy Parker  Procedure(s) Performed: TRANSURETHRAL RESECTION OF THE PROSTATE (TURP) abcess (N/A Prostate)     Patient location during evaluation: PACU Anesthesia Type: General Level of consciousness: awake and alert, oriented and patient cooperative Pain management: pain level controlled Vital Signs Assessment: post-procedure vital signs reviewed and stable Respiratory status: spontaneous breathing, nonlabored ventilation and respiratory function stable Cardiovascular status: blood pressure returned to baseline and stable Postop Assessment: no apparent nausea or vomiting Anesthetic complications: no   No complications documented.  Last Vitals:  Vitals:   08/13/20 1215 08/13/20 1415  BP: 134/76 123/66  Pulse: 91 84  Resp: 18 13  Temp: 37.2 C (!) 36.3 C  SpO2: 95% 95%    Last Pain:  Vitals:   08/13/20 1415  TempSrc:   PainSc: (P) 0-No pain                 Pervis Hocking

## 2020-08-14 ENCOUNTER — Inpatient Hospital Stay (HOSPITAL_COMMUNITY): Payer: Medicare Other

## 2020-08-14 ENCOUNTER — Encounter (HOSPITAL_COMMUNITY): Payer: Self-pay | Admitting: Urology

## 2020-08-14 LAB — COMPREHENSIVE METABOLIC PANEL
ALT: 12 U/L (ref 0–44)
AST: 22 U/L (ref 15–41)
Albumin: 2.4 g/dL — ABNORMAL LOW (ref 3.5–5.0)
Alkaline Phosphatase: 54 U/L (ref 38–126)
Anion gap: 7 (ref 5–15)
BUN: 25 mg/dL — ABNORMAL HIGH (ref 8–23)
CO2: 23 mmol/L (ref 22–32)
Calcium: 8 mg/dL — ABNORMAL LOW (ref 8.9–10.3)
Chloride: 106 mmol/L (ref 98–111)
Creatinine, Ser: 1.1 mg/dL (ref 0.61–1.24)
GFR calc Af Amer: >60 mL/min (ref >60)
GFR calc non Af Amer: >60 mL/min (ref >60)
Glucose, Bld: 104 mg/dL — ABNORMAL HIGH (ref 70–99)
Potassium: 4.2 mmol/L (ref 3.5–5.1)
Sodium: 136 mmol/L (ref 135–145)
Total Bilirubin: 0.3 mg/dL (ref 0.3–1.2)
Total Protein: 6.5 g/dL (ref 6.5–8.1)

## 2020-08-14 LAB — BASIC METABOLIC PANEL
Anion gap: 9 (ref 5–15)
BUN: 24 mg/dL — ABNORMAL HIGH (ref 8–23)
CO2: 21 mmol/L — ABNORMAL LOW (ref 22–32)
Calcium: 7.9 mg/dL — ABNORMAL LOW (ref 8.9–10.3)
Chloride: 105 mmol/L (ref 98–111)
Creatinine, Ser: 1.16 mg/dL (ref 0.61–1.24)
GFR calc Af Amer: >60 mL/min (ref >60)
GFR calc non Af Amer: 58 mL/min — ABNORMAL LOW (ref >60)
Glucose, Bld: 110 mg/dL — ABNORMAL HIGH (ref 70–99)
Potassium: 4 mmol/L (ref 3.5–5.1)
Sodium: 135 mmol/L (ref 135–145)

## 2020-08-14 LAB — CBC
HCT: 38.9 % — ABNORMAL LOW (ref 39.0–52.0)
Hemoglobin: 12.5 g/dL — ABNORMAL LOW (ref 13.0–17.0)
MCH: 28.6 pg (ref 26.0–34.0)
MCHC: 32.1 g/dL (ref 30.0–36.0)
MCV: 89 fL (ref 80.0–100.0)
Platelets: 250 10*3/uL (ref 150–400)
RBC: 4.37 MIL/uL (ref 4.22–5.81)
RDW: 13.6 % (ref 11.5–15.5)
WBC: 14.5 10*3/uL — ABNORMAL HIGH (ref 4.0–10.5)
nRBC: 0 % (ref 0.0–0.2)

## 2020-08-14 MED ORDER — SODIUM CHLORIDE (PF) 0.9 % IJ SOLN
INTRAMUSCULAR | Status: AC
  Filled 2020-08-14: qty 50

## 2020-08-14 MED ORDER — IOHEXOL 300 MG/ML  SOLN
75.0000 mL | Freq: Once | INTRAMUSCULAR | Status: AC | PRN
  Administered 2020-08-14: 75 mL via INTRAVENOUS

## 2020-08-14 NOTE — Progress Notes (Signed)
Patient ID: Billy Parker, male   DOB: 03-27-1936, 84 y.o.   MRN: [id number]  1 Day Post-Op Subjective: S/P TUR of large prostatic abscess yesterday.  He feels well and has no complaints today.  No fever.  Objective: Vital signs in last 24 hours: Temp:  [97.4 F (36.3 C)-98.9 F (37.2 C)] 97.9 F (36.6 C) (08/15 0600) Pulse Rate:  [61-91] 81 (08/15 0905) Resp:  [13-22] 20 (08/15 0600) BP: (94-134)/(56-76) 118/61 (08/15 0905) SpO2:  [92 %-100 %] 96 % (08/15 0600)  Intake/Output from previous day: 08/14 0701 - 08/15 0700 In: 2342.9 [I.V.:2142.9; IV Piggyback:200] Out: 2 [Blood:2] Intake/Output this shift: Total I/O In: -  Out: 800 [Urine:800]  Physical Exam:  General: Alert and oriented Abdomen: Soft, ND, NT GU: Urine draining clear and well Ext: NT, No erythema  Lab Results: Recent Labs    08/12/20 1258 08/13/20 0939 08/14/20 0500  HGB 14.4 13.3 12.5*  HCT 45.2 41.3 38.9*   CBC Latest Ref Rng & Units 08/14/2020 08/13/2020 08/12/2020  WBC 4.0 - 10.5 K/uL 14.5(H) 9.4 10.0  Hemoglobin 13.0 - 17.0 g/dL 12.5(L) 13.3 14.4  Hematocrit 39 - 52 % 38.9(L) 41.3 45.2  Platelets 150 - 400 K/uL 250 276 291     BMET Recent Labs    08/13/20 0939 08/14/20 0500  NA 137 135  K 4.8 4.0  CL 108 105  CO2 19* 21*  GLUCOSE 106* 110*  BUN 21 24*  CREATININE 1.09 1.16  CALCIUM 7.6* 7.9*     Studies/Results: Urine and abscess cultures pending. GNRs growing.  Assessment/Plan: 1) Prostatic abscess: Continue ceftriaxone pending cultures.  Continue urethral catheter to allow urethra to heal. 2) Right renal mass:  Will further evaluate and stage with MR of abdomen and CT chest.   LOS: 1 day   [name] 08/14/2020, 9:54 AM

## 2020-08-15 ENCOUNTER — Inpatient Hospital Stay (HOSPITAL_COMMUNITY): Payer: Medicare Other

## 2020-08-15 LAB — COMPREHENSIVE METABOLIC PANEL
ALT: 13 U/L (ref 0–44)
AST: 21 U/L (ref 15–41)
Albumin: 2.3 g/dL — ABNORMAL LOW (ref 3.5–5.0)
Alkaline Phosphatase: 51 U/L (ref 38–126)
Anion gap: 9 (ref 5–15)
BUN: 21 mg/dL (ref 8–23)
CO2: 22 mmol/L (ref 22–32)
Calcium: 7.9 mg/dL — ABNORMAL LOW (ref 8.9–10.3)
Chloride: 105 mmol/L (ref 98–111)
Creatinine, Ser: 1.11 mg/dL (ref 0.61–1.24)
GFR calc Af Amer: >60 mL/min (ref >60)
GFR calc non Af Amer: >60 mL/min (ref >60)
Glucose, Bld: 92 mg/dL (ref 70–99)
Potassium: 3.6 mmol/L (ref 3.5–5.1)
Sodium: 136 mmol/L (ref 135–145)
Total Bilirubin: 0.5 mg/dL (ref 0.3–1.2)
Total Protein: 6.2 g/dL — ABNORMAL LOW (ref 6.5–8.1)

## 2020-08-15 LAB — CBC
HCT: 39.1 % (ref 39.0–52.0)
Hemoglobin: 12.4 g/dL — ABNORMAL LOW (ref 13.0–17.0)
MCH: 28.5 pg (ref 26.0–34.0)
MCHC: 31.7 g/dL (ref 30.0–36.0)
MCV: 89.9 fL (ref 80.0–100.0)
Platelets: 298 10*3/uL (ref 150–400)
RBC: 4.35 MIL/uL (ref 4.22–5.81)
RDW: 13.6 % (ref 11.5–15.5)
WBC: 10 10*3/uL (ref 4.0–10.5)
nRBC: 0 % (ref 0.0–0.2)

## 2020-08-15 LAB — URINE CULTURE: Culture: >=100000 — AB

## 2020-08-15 MED ORDER — GADOBUTROL 1 MMOL/ML IV SOLN
7.0000 mL | Freq: Once | INTRAVENOUS | Status: AC | PRN
  Administered 2020-08-15: 7 mL via INTRAVENOUS

## 2020-08-15 MED ORDER — SULFAMETHOXAZOLE-TRIMETHOPRIM 800-160 MG PO TABS
1.0000 | ORAL_TABLET | Freq: Two times a day (BID) | ORAL | Status: DC
  Administered 2020-08-15 – 2020-08-16 (×2): 1 via ORAL
  Filled 2020-08-15 (×2): qty 1

## 2020-08-15 NOTE — Progress Notes (Signed)
Patient ID: Billy Parker, male   DOB: 02/08/1936, 84 y.o.   MRN: [id number]  2 Days Post-Op Subjective: Pt doing well.  No complaints.  Objective: Vital signs in last 24 hours: Temp:  [98.2 F (36.8 C)-98.9 F (37.2 C)] 98.5 F (36.9 C) (08/16 0526) Pulse Rate:  [71-81] 74 (08/16 0526) Resp:  [18] 18 (08/16 0526) BP: (102-118)/(52-71) 118/62 (08/16 0526) SpO2:  [95 %-98 %] 95 % (08/16 0526)  Intake/Output from previous day: 08/15 0701 - 08/16 0700 In: 1526.8 [P.O.:600; I.V.:826.8; IV Piggyback:100] Out: 2750 [Urine:2750] Intake/Output this shift: No intake/output data recorded.  Physical Exam:  General: Alert and oriented Abd: Soft, NT GU: Urine clear  Lab Results: Recent Labs    08/13/20 0939 08/14/20 0500 08/15/20 0519  HGB 13.3 12.5* 12.4*  HCT 41.3 38.9* 39.1   CBC Latest Ref Rng & Units 08/15/2020 08/14/2020 08/13/2020  WBC 4.0 - 10.5 K/uL 10.0 14.5(H) 9.4  Hemoglobin 13.0 - 17.0 g/dL 12.4(L) 12.5(L) 13.3  Hematocrit 39 - 52 % 39.1 38.9(L) 41.3  Platelets 150 - 400 K/uL 298 250 276     BMET Recent Labs    08/14/20 0545 08/15/20 0519  NA 136 136  K 4.2 3.6  CL 106 105  CO2 23 22  GLUCOSE 104* 92  BUN 25* 21  CREATININE 1.10 1.11  CALCIUM 8.0* 7.9*     Studies/Results: CT CHEST W CONTRAST  Result Date: 08/14/2020 CLINICAL DATA:  Staging. EXAM: CT CHEST WITH CONTRAST TECHNIQUE: Multidetector CT imaging of the chest was performed during intravenous contrast administration. CONTRAST:  79mL OMNIPAQUE IOHEXOL 300 MG/ML  SOLN COMPARISON:  None. FINDINGS: Cardiovascular: Heart is mildly enlarged. Trace fluid superior pericardial recess. Thoracic aortic and coronary arterial vascular calcifications. Mediastinum/Nodes: No enlarged axillary, mediastinal or hilar lymphadenopathy. Normal appearance of the esophagus. Lungs/Pleura: Central airways are patent. Linear bandlike scarring/atelectasis within the lower lobes bilaterally. Centrilobular and paraseptal  emphysematous change. No pleural effusion or pneumothorax. Upper Abdomen: Liver is nodular in contour. Subcentimeter too small to characterize low-attenuation lesion hepatic dome (image 138; series 2). 1.5 cm left adrenal adenoma. No acute process. Musculoskeletal: Thoracic spine degenerative changes. No aggressive or acute appearing osseous lesions. IMPRESSION: 1. No evidence for metastatic disease in the chest. 2. Morphologic changes to the liver suggestive of cirrhosis. Subcentimeter low-attenuation lesion within hepatic dome. Recommend attention on upcoming abdominal MRI. 3. Emphysema and aortic atherosclerosis. Electronically Signed   By: Lovey Newcomer M.D.   On: 08/14/2020 14:07   Urine and abscess culture: E coli with sensitivities pending.  Assessment/Plan: 1) Prostatic abscess: Continue ceftriaxone and await final sensitivities for continued oral therapy. Continue urethral catheter. 2) Right renal mass: CT chest with no evidence of metastatic disease.  MRI pending for today.   LOS: 2 days   [name] 08/15/2020, 7:27 AM

## 2020-08-16 LAB — SURGICAL PATHOLOGY

## 2020-08-16 MED ORDER — SULFAMETHOXAZOLE-TRIMETHOPRIM 800-160 MG PO TABS: 1.0000 | ORAL_TABLET | Freq: Two times a day (BID) | ORAL | 0 refills | Status: DC

## 2020-08-16 NOTE — Discharge Instructions (Addendum)
1. Please call if fever > 101 or difficulty with the catheter not draining. 2. Complete your antibiotic until it is completed.

## 2020-08-16 NOTE — Care Management Important Message (Signed)
Important Message  Patient Details IM Letter given to the Patient Name: Billy Parker MRN: [id number] Date of Birth: August 11, 1936   Medicare Important Message Given:  Yes     Kerin Salen 08/16/2020, 10:31 AM

## 2020-08-16 NOTE — Progress Notes (Signed)
Patient ID: Billy Parker, male   DOB: 04/24/1936, 84 y.o.   MRN: [id number]  3 Days Post-Op Subjective: Pt continues to do well without complaints.  Objective: Vital signs in last 24 hours: Temp:  [98.2 F (36.8 C)-98.6 F (37 C)] 98.6 F (37 C) (08/17 0357) Pulse Rate:  [77-80] 77 (08/17 0357) Resp:  [18-25] 18 (08/17 0357) BP: (107-124)/(69-73) 124/69 (08/17 0357) SpO2:  [95 %-96 %] 96 % (08/17 0357)  Intake/Output from previous day: 08/16 0701 - 08/17 0700 In: 1694.1 [P.O.:730; I.V.:864.1; IV Piggyback:100] Out: 2700 [Urine:2700] Intake/Output this shift: No intake/output data recorded.  Physical Exam:  General: Alert and oriented Abdomen: Soft, ND, NT GU: Urine clear  Lab Results: Recent Labs    08/13/20 0939 08/14/20 0500 08/15/20 0519  HGB 13.3 12.5* 12.4*  HCT 41.3 38.9* 39.1   BMET Recent Labs    08/14/20 0545 08/15/20 0519  NA 136 136  K 4.2 3.6  CL 106 105  CO2 23 22  GLUCOSE 104* 92  BUN 25* 21  CREATININE 1.10 1.11  CALCIUM 8.0* 7.9*     Studies/Results: CT CHEST W CONTRAST  Result Date: 08/14/2020 CLINICAL DATA:  Staging. EXAM: CT CHEST WITH CONTRAST TECHNIQUE: Multidetector CT imaging of the chest was performed during intravenous contrast administration. CONTRAST:  51mL OMNIPAQUE IOHEXOL 300 MG/ML  SOLN COMPARISON:  None. FINDINGS: Cardiovascular: Heart is mildly enlarged. Trace fluid superior pericardial recess. Thoracic aortic and coronary arterial vascular calcifications. Mediastinum/Nodes: No enlarged axillary, mediastinal or hilar lymphadenopathy. Normal appearance of the esophagus. Lungs/Pleura: Central airways are patent. Linear bandlike scarring/atelectasis within the lower lobes bilaterally. Centrilobular and paraseptal emphysematous change. No pleural effusion or pneumothorax. Upper Abdomen: Liver is nodular in contour. Subcentimeter too small to characterize low-attenuation lesion hepatic dome (image 138; series 2). 1.5 cm left adrenal  adenoma. No acute process. Musculoskeletal: Thoracic spine degenerative changes. No aggressive or acute appearing osseous lesions. IMPRESSION: 1. No evidence for metastatic disease in the chest. 2. Morphologic changes to the liver suggestive of cirrhosis. Subcentimeter low-attenuation lesion within hepatic dome. Recommend attention on upcoming abdominal MRI. 3. Emphysema and aortic atherosclerosis. Electronically Signed   By: Lovey Newcomer M.D.   On: 08/14/2020 14:07   MR ABDOMEN W WO CONTRAST  Result Date: 08/15/2020 CLINICAL DATA:  Right renal mass on CT. EXAM: MRI ABDOMEN WITHOUT AND WITH CONTRAST TECHNIQUE: Multiplanar multisequence MR imaging of the abdomen was performed both before and after the administration of intravenous contrast. CONTRAST:  90mL GADAVIST GADOBUTROL 1 MMOL/ML IV SOLN COMPARISON:  Abdominal CT 08/13/2020. FINDINGS: Lower chest: Normal heart size.  Trace bilateral pleural fluid. Hepatobiliary: Moderate cirrhosis, as evidenced by irregular hepatic capsule. High right hepatic subcentimeter cysts or minimally complex cysts. No suspicious liver lesion. Normal gallbladder, without biliary ductal dilatation. Pancreas:  Normal, without mass or ductal dilatation. Spleen:  Normal in size, without focal abnormality. Adrenals/Urinary Tract: A left adrenal nodule measures 1.7 cm on 37/5. Although this is poorly evaluated on the in phase series secondary to motion and other artifact, it is low-density on the chest CT of same date, consistent with an adenoma. Bilateral renal cysts. Tiny bilateral renal lesions which demonstrate precontrast T1 hyperintensity on series 11. Neither of these are enhancing, including on subtracted images. Corresponding to the CT abnormality is a 4.9 x 3.4 cm hyperenhancing mass within the inter and lower pole right kidney. Example 23/4, 56/16. Measures maximally 3.7 cm craniocaudal on 35/20. Centered in the posterior cortex, but extends into the right renal pelvis  anteriorly.  Suspect secondary mild caliectasis within the interpolar right kidney on 54/16 and 15/8 of the recent abdominal CT. Stomach/Bowel: Normal stomach and abdominal bowel loops. Vascular/Lymphatic: Aortic atherosclerosis. Patent renal veins. Patent portal and hepatic veins. No specific evidence of portal venous hypertension. No retroperitoneal or retrocrural adenopathy. Other:  Trace perihepatic ascites. Musculoskeletal: No acute osseous abnormality. IMPRESSION: 1. Right inter/lower pole renal mass, consistent with renal cell carcinoma. No renal vein involvement or abdominal metastatic disease. 2. Cirrhosis, without hepatocellular carcinoma. 3. Trace bilateral pleural fluid and perihepatic ascites. 4.  Aortic Atherosclerosis (ICD10-I70.0). 5. Left adrenal adenoma Electronically Signed   By: Abigail Miyamoto M.D.   On: 08/15/2020 12:27    Urine and abscess cultures with pansensitive E coli.  Assessment/Plan: 1) Prostatic abscess: Will discharge on oral TMP/SMX for one month.  Continue urethral catheter to allow urethral healing.  Will schedule for outpatient follow up with voiding trial in the future.  2) Right renal mass:  Imaging consistent with localized renal cell carcinoma.  Results reviewed with patient. Will discuss treatment at follow up visit.   LOS: 3 days   [name] 08/16/2020, 7:36 AM

## 2020-08-16 NOTE — Plan of Care (Signed)
  Problem: Education: Goal: Knowledge of General Education information will improve Description: Including pain rating scale, medication(s)/side effects and non-pharmacologic comfort measures Outcome: Progressing   Problem: Health Behavior/Discharge Planning: Goal: Ability to manage health-related needs will improve Outcome: Progressing   Problem: Clinical Measurements: Goal: Ability to maintain clinical measurements within normal limits will improve Outcome: Progressing Goal: Will remain free from infection Outcome: Progressing Goal: Diagnostic test results will improve Outcome: Progressing Goal: Respiratory complications will improve Outcome: Progressing Goal: Cardiovascular complication will be avoided Outcome: Progressing   Problem: Activity: Goal: Risk for activity intolerance will decrease Outcome: Progressing   Problem: Nutrition: Goal: Adequate nutrition will be maintained Outcome: Progressing   Problem: Coping: Goal: Level of anxiety will decrease Outcome: Progressing   Problem: Elimination: Goal: Will not experience complications related to bowel motility Outcome: Progressing Goal: Will not experience complications related to urinary retention Outcome: Progressing   Problem: Pain Managment: Goal: General experience of comfort will improve Outcome: Progressing   Problem: Safety: Goal: Ability to remain free from injury will improve Outcome: Progressing   Problem: Skin Integrity: Goal: Risk for impaired skin integrity will decrease Outcome: Progressing   Problem: Education: Goal: Knowledge of the prescribed therapeutic regimen will improve Outcome: Progressing   Problem: Bowel/Gastric: Goal: Gastrointestinal status for postoperative course will improve Outcome: Progressing   Problem: Health Behavior/Discharge Planning: Goal: Identification of resources available to assist in meeting health care needs will improve Outcome: Progressing   Problem:  Skin Integrity: Goal: Demonstration of wound healing without infection will improve Outcome: Progressing   Problem: Urinary Elimination: Goal: Ability to avoid or minimize complications of infection will improve Outcome: Progressing   

## 2020-08-16 NOTE — Discharge Summary (Signed)
Date of admission: 08/12/2020  Date of discharge: 08/16/2020  Admission diagnosis: Prostatic abscess, right renal mass  Discharge diagnosis: Prostatic abscess, right renal mass  Secondary diagnoses: Hypertension, asthma, CAD  History and Physical: For full details, please see admission history and physical. Briefly, Billy Parker is a 84 y.o. year old patient who presented to the ED on 08/12/20 with dysuria and nausea/anorexia.  CT imaging revealed a large prostatic abscess and an incidental right renal mass.   Hospital Course: He was admitted for IV antibiotic therapy and went to the OR on 08/12/20 and underwent transurethral unroofing of a large prostatic abscess.  Cultures eventually returned positive for pansensitive E coli and he was transitioned to oral TMP/SMX.  He remained hemodynamically stable during his hospitalization.  Due to the large cavity defect in the prostate, he will keep an indwelling urethral catheter to allow for urethral healing.  He was also incidentally noted to have a 4.5 cm right renal mass.  MR imaging during his hospitalization confirmed an enhancing mass consistent with renal cell carcinoma.  CT imaging of the chest was negative for metastatic disease.  Laboratory values:  Recent Labs    08/13/20 0939 08/14/20 0500 08/15/20 0519  HGB 13.3 12.5* 12.4*  HCT 41.3 38.9* 39.1   Recent Labs    08/14/20 0545 08/15/20 0519  CREATININE 1.10 1.11    Disposition: Home  Discharge instruction: The patient was instructed to be ambulatory but told to refrain from heavy lifting, strenuous activity, or driving. He was instructed on routine catheter care.  He will be scheduled to follow up for a voiding trial and to further discuss management of his right renal mass.  Discharge medications:  Allergies as of 08/16/2020      Reactions   Other Swelling   Antibiotic (name not recalled by the patient) = both legs became swollen      Medication List    TAKE these  medications   albuterol 108 (90 Base) MCG/ACT inhaler Commonly known as: VENTOLIN HFA Inhale 2 puffs into the lungs every 6 (six) hours as needed for wheezing or shortness of breath.   aspirin 81 MG EC tablet Take 1 tablet (81 mg total) by mouth daily.   furosemide 40 MG tablet Commonly known as: LASIX Take 1 tablet (40 mg total) by mouth daily.   isosorbide mononitrate 30 MG 24 hr tablet Commonly known as: IMDUR Take 0.5 tablets (15 mg total) by mouth daily.   lisinopril 2.5 MG tablet Commonly known as: ZESTRIL Take 1 tablet (2.5 mg total) by mouth daily.   multivitamin with minerals Tabs tablet Take 1 tablet by mouth daily.   potassium chloride 20 MEQ packet Commonly known as: KLOR-CON Take 20 mEq by mouth daily.   sulfamethoxazole-trimethoprim 800-160 MG tablet Commonly known as: BACTRIM DS Take 1 tablet by mouth 2 (two) times daily.       Followup:   Follow-up Information    Raynelle Bring, MD Follow up.   Specialty: Urology Why: Office will call to schedule for about 3 weeks. Contact information: [place] [address] [phone]

## 2020-08-18 LAB — AEROBIC/ANAEROBIC CULTURE W GRAM STAIN (SURGICAL/DEEP WOUND)

## 2020-10-21 ENCOUNTER — Ambulatory Visit: Payer: Medicare Other | Admitting: Cardiology

## 2020-11-11 ENCOUNTER — Other Ambulatory Visit: Payer: Self-pay

## 2020-11-11 ENCOUNTER — Encounter: Payer: Self-pay | Admitting: Cardiology

## 2020-11-11 ENCOUNTER — Ambulatory Visit (INDEPENDENT_AMBULATORY_CARE_PROVIDER_SITE_OTHER): Payer: Medicare Other | Admitting: Cardiology

## 2020-11-11 VITALS — BP 140/54 | HR 75 | Ht 65.0 in | Wt 160.0 lb

## 2020-11-11 DIAGNOSIS — I10 Essential (primary) hypertension: Secondary | ICD-10-CM

## 2020-11-11 DIAGNOSIS — I2510 Atherosclerotic heart disease of native coronary artery without angina pectoris: Secondary | ICD-10-CM

## 2020-11-11 DIAGNOSIS — I428 Other cardiomyopathies: Secondary | ICD-10-CM

## 2020-11-11 DIAGNOSIS — I340 Nonrheumatic mitral (valve) insufficiency: Secondary | ICD-10-CM

## 2020-11-11 DIAGNOSIS — F1410 Cocaine abuse, uncomplicated: Secondary | ICD-10-CM | POA: Diagnosis not present

## 2020-11-11 DIAGNOSIS — I493 Ventricular premature depolarization: Secondary | ICD-10-CM

## 2020-11-11 DIAGNOSIS — Z01810 Encounter for preprocedural cardiovascular examination: Secondary | ICD-10-CM | POA: Insufficient documentation

## 2020-11-11 DIAGNOSIS — I5022 Chronic systolic (congestive) heart failure: Secondary | ICD-10-CM | POA: Diagnosis not present

## 2020-11-11 MED ORDER — LISINOPRIL 5 MG PO TABS: 5.0000 mg | ORAL_TABLET | Freq: Every day | ORAL | 2 refills | Status: AC

## 2020-11-11 NOTE — Patient Instructions (Signed)
Medication Instructions:   Increase Lisinopril to 5 mg daily   *If you need a refill on your cardiac medications before your next appointment, please call your pharmacy*   Lab Work: NOT NEEDED.   Testing/Procedures: WILL BE SCHEDULE AT [place] Your physician has requested that you have an echocardiogram. Echocardiography is a painless test that uses sound waves to create images of your heart. It provides your doctor with information about the size and shape of your heart and how well your heart's chambers and valves are working. This procedure takes approximately one hour. There are no restrictions for this procedure.    Follow-Up: At [facility], you and your health needs are our priority.  As part of our continuing mission to provide you with exceptional heart care, we have created designated Provider Care Teams.  These Care Teams include your primary Cardiologist (physician) and Advanced Practice Providers (APPs -  Physician Assistants and Nurse Practitioners) who all work together to provide you with the care you need, when you need it.  We recommend signing up for the patient portal called "MyChart".  Sign up information is provided on this After Visit Summary.  MyChart is used to connect with patients for Virtual Visits (Telemedicine).  Patients are able to view lab/test results, encounter notes, upcoming appointments, etc.  Non-urgent messages can be sent to your provider as well.   To learn more about what you can do with MyChart, go to [email or web address].    Your next appointment:   6 month(s)  The format for your next appointment:   In Person  Provider:   Glenetta Hew, MD   Other Instructions   Depending on the outcome of Echo  - whether you have clearance for  Surgery

## 2020-11-11 NOTE — Progress Notes (Signed)
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 [place] Your physician has requested that you have an echocardiogram. Echocardiography is a painless test that uses sound waves to create images of your heart. It provides your doctor with information about the size and shape of your heart and how well your heart's chambers and valves are working. This procedure takes approximately one hour. There are no restrictions for this procedure.    Follow-Up: At [facility], you and your health needs are our priority.  As part of our continuing mission to provide you with exceptional heart care, we have created designated Provider Care Teams.  These Care Teams include your primary Cardiologist (physician) and Advanced Practice Providers (APPs -  Physician Assistants and Nurse Practitioners) who all work together to provide you with the care you need, when you need it.  We recommend signing up for the patient portal called "MyChart".  Sign up information is provided on this After Visit Summary.  MyChart  is used to connect with patients for Virtual Visits (Telemedicine).  Patients are able to view lab/test results, encounter notes, upcoming appointments, etc.  Non-urgent messages can be sent to your provider as well.   To learn more about what you can do with MyChart, go to [email or web address].    Your next appointment:   6 month(s)  The format for your next appointment:   In Person  Provider:   Glenetta Hew, MD   Other Instructions   Depending on the outcome of Echo  - whether you have clearance for  Surgery     Studies Ordered:   Orders Placed This Encounter  Procedures  . EKG 12-Lead  . ECHOCARDIOGRAM COMPLETE     Glenetta Hew, M.D., M.S. Interventional Cardiologist   Pager # [phone] Phone # [phone] [address] [place], [place] [address]   Thank you for choosing Heartcare at [facility]!!

## 2020-11-14 ENCOUNTER — Encounter: Payer: Self-pay | Admitting: Cardiology

## 2020-11-14 ENCOUNTER — Other Ambulatory Visit (HOSPITAL_COMMUNITY): Payer: Medicare Other

## 2020-11-14 NOTE — Assessment & Plan Note (Signed)
Billy Parker is an 84 year old gentleman with chronic combined heart failure (however notably improved with no active symptoms).  He does not have coronary disease or history of stroke.  He is not diabetic, but does not creatinine 2.1 on most recent check, but has not been that high.  He is able to exercise at least 6 METS - "walking to the store" without significant dyspnea or chest pain.  Planned surgery is laparoscopic, -> which would be intermediate not high risk  PREOPERATIVE CARDIAC RISK ASSESSMENT   Revised Cardiac Risk Index:  High Risk Surgery: no; ~ INTERMEDIATE CARDIAC RISK  Defined as Intraperitoneal, intrathoracic or suprainguinal vascular  `Active CAD: no; no CAD on CATH  CHF: yes; but NOT ACTIVE --> will recheck Echo for new baseline EF.   Cerebrovascular Disease: no; none documented.   Diabetes: no; On Insulin: no  CKD (Cr >~ 2): yes; current level is 2.1, but not chronically.   Total: ~1-2 Estimated Risk of Adverse Outcome: CLASS II RISK   Estimated Risk of MI, PE, VF/VT (Cardiac Arrest), Complete Heart Block: ~10 %   ACC/AHA Guidelines for "Clearance":  Step 1 - Need for Emergency Surgery: No: Elective (but relatively Urgent).  If Yes - go straight to OR with perioperative surveillance  Step 2 - Active Cardiac Conditions (Unstable Angina, Decompensated HF, Significant  Arrhytmias - Complete HB, Mobitz II, Symptomatic VT or SVT, Severe Aortic Stenosis - mean gradient > 40 mmHg, Valve area < 1.0 cm2):   No: NOT ACTIVE   If Yes - Evaluate & Treat per ACC/AHA Guidelines  Step 3 -  Low Risk Surgery: No: AT LEAST INTERMEDIATE  If Yes --> proceed to OR  If No --> Step 4  Step 4 - Functional Capacity >= 4 METS without symptoms: Yes  If Yes --> proceed to OR  If No --> Step 5  Step 5 --  Clinical Risk Factors (CRF)   3 or more: No:     1-2 or more CRFs: Yes  If Yes -- assess Surgical Risk, --   (High Risk Non-cardiac), Intraabdominal or thoracic  vascular surgery --> Proceed to OR, or consider testing if it will change management.  Intermediate Risk: Proceed to OR with HR control, or consider testing if it will change management.  We will check a 2D echocardiogram to see a new baseline, but this should not preclude him going to surgery.  Would not check ischemic evaluation as he is asymptomatic, and has had no evidence of CAD on cardiac catheterization.

## 2020-11-14 NOTE — Assessment & Plan Note (Signed)
Not currently on beta-blocker, however would be beneficial to consider carvedilol 3.125 mg twice daily as a nonselective beta-blocker. ->  Concern because of cocaine use.

## 2020-11-14 NOTE — Assessment & Plan Note (Addendum)
He has known cardiomyopathy, but has never followed up with cardiology since 2016.  He amazingly has continued to do well and has not had another heart failure exacerbation since November 2020.  Interestingly, despite having recurrent exacerbations, his EF is now improved from what has been 20 to 25% up to 45% average on the last 2 echoes.  He is not on beta-blocker because of this tendency to use cocaine.  This is unfortunate, because he does have frequent PVCs and beta-blocker would be helpful.  Also beta-blocker would be protective perioperatively. ->  However, we cannot take that chance.  Right now NYHA Class I-II CHF symptoms close to Class I.  Plan: Recheck 2D echo to get new baseline EF preoperatively.  Continue lisinopril and furosemide, but increase lisinopril to 5 mg daily with blood pressure 140/54 mmHg  Standing dose of furosemide, we discussed sliding scale use.  See above-not on beta-blocker -> would like to consider restarting carvedilol if he does continue to follow-up.

## 2020-11-14 NOTE — Assessment & Plan Note (Signed)
Blood pressure is actually little higher today than mom would expect.    Increase lisinopril to 5 mg.

## 2020-11-14 NOTE — Assessment & Plan Note (Signed)
He really did not have any coronary disease on cardiac catheterization 7 years ago.  No anginal symptoms.

## 2020-11-14 NOTE — Assessment & Plan Note (Signed)
He absolutely cannot continue to do cocaine at age 84 with his heart.  It is remarkable that he has lived this long with his lifestyle.  I instructed him the use of cocaine precludes [place] from using a very important medication for him.  He sheepishly says he does not mean to do it, and that he gets bullied into doing it by his friends -> he indicates whenever his friends are around, before he knows it they are doing drugs-(alcohol, marijuana or cocaine), and he falls into his routine and joints in.

## 2020-11-14 NOTE — Assessment & Plan Note (Signed)
Still has mildly reduced ejection fraction, but notably improved from 2014.  Plan: Recheck 2D echo.  Essentially on Imdur plus lisinopril for afterload reduction  Not on beta-blocker-see elsewhere  On stable dose of furosemide-have been discussed sliding scale

## 2020-11-14 NOTE — Assessment & Plan Note (Signed)
Moderate on previous echoes as well as on exam.  Plan is to recheck echocardiogram

## 2020-12-01 ENCOUNTER — Other Ambulatory Visit: Payer: Self-pay | Admitting: Urology

## 2020-12-05 ENCOUNTER — Other Ambulatory Visit (HOSPITAL_COMMUNITY): Payer: Medicare Other

## 2020-12-07 ENCOUNTER — Encounter (HOSPITAL_COMMUNITY): Payer: Self-pay | Admitting: Cardiology

## 2020-12-07 ENCOUNTER — Ambulatory Visit (HOSPITAL_COMMUNITY): Admission: RE | Admit: 2020-12-07 | Payer: Medicare Other | Source: Ambulatory Visit

## 2020-12-12 NOTE — Progress Notes (Signed)
DUE TO COVID-19 ONLY ONE VISITOR IS ALLOWED TO COME WITH YOU AND STAY IN THE WAITING ROOM ONLY DURING PRE OP AND PROCEDURE DAY OF SURGERY. THE 1 VISITOR  MAY VISIT WITH YOU AFTER SURGERY IN YOUR PRIVATE ROOM DURING VISITING HOURS ONLY!  YOU NEED TO HAVE A COVID 19 TEST ON____12/16/2021 ___ @_______ , THIS TEST MUST BE DONE BEFORE SURGERY,  COVID TESTING SITE [address] [place] [address], IT IS ON THE RIGHT GOING OUT [address] APPROXIMATELY  2 MINUTES PAST [employer] ON THE RIGHT. ONCE YOUR COVID TEST IS COMPLETED,  PLEASE BEGIN THE QUARANTINE INSTRUCTIONS AS OUTLINED IN YOUR HANDOUT.                Billy Parker  12/12/2020   Your procedure is scheduled on: 12/19/2020    Report to [facility] Main  Entrance   Report to admitting at     1045 AM     Call this number if you have problems the morning of surgery [phone]    Remember: Do not eat food , candy gum or mints :After Midnight. You may have clear liquids from midnight until 0945am   1/2 bottle of magnesium citrate at 12 noon day before surgery.   CLEAR LIQUID DIET   Foods Allowed                                                                       Coffee and tea, regular and decaf                              Plain Jell-O any favor except red or purple                                            Fruit ices (not with fruit pulp)                                      Iced Popsicles                                     Carbonated beverages, regular and diet                                    Cranberry, grape and apple juices Sports drinks like Gatorade Lightly seasoned clear broth or consume(fat free) Sugar, honey syrup   _____________________________________________________________________    BRUSH YOUR TEETH MORNING OF SURGERY AND RINSE YOUR MOUTH OUT, NO CHEWING GUM CANDY OR MINTS.     Take these medicines the morning of surgery with A SIP OF WATER: Inhalers as usual and bring,  Imdur   DO NOT TAKE ANY DIABETIC MEDICATIONS DAY OF YOUR SURGERY  You may not have any metal on your body including hair pins and              piercings  Do not wear jewelry, make-up, lotions, powders or perfumes, deodorant             Do not wear nail polish on your fingernails.  Do not shave  48 hours prior to surgery.              Men may shave face and neck.   Do not bring valuables to the hospital. [place].  Contacts, dentures or bridgework may not be worn into surgery.  Leave suitcase in the car. After surgery it may be brought to your room.     Patients discharged the day of surgery will not be allowed to drive home. IF YOU ARE HAVING SURGERY AND GOING HOME THE SAME DAY, YOU MUST HAVE AN ADULT TO DRIVE YOU HOME AND BE WITH YOU FOR 24 HOURS. YOU MAY GO HOME BY TAXI OR UBER OR ORTHERWISE, BUT AN ADULT MUST ACCOMPANY YOU HOME AND STAY WITH YOU FOR 24 HOURS.  Name and phone number of your driver:  Special Instructions: N/A              Please read over the following fact sheets you were given: _____________________________________________________________________  [facility] - Preparing for Surgery Before surgery, you can play an important role.  Because skin is not sterile, your skin needs to be as free of germs as possible.  You can reduce the number of germs on your skin by washing with CHG (chlorahexidine gluconate) soap before surgery.  CHG is an antiseptic cleaner which kills germs and bonds with the skin to continue killing germs even after washing. Please DO NOT use if you have an allergy to CHG or antibacterial soaps.  If your skin becomes reddened/irritated stop using the CHG and inform your nurse when you arrive at Short Stay. Do not shave (including legs and underarms) for at least 48 hours prior to the first CHG shower.  You may shave your face/neck. Please follow these instructions  carefully:  1.  Shower with CHG Soap the night before surgery and the  morning of Surgery.  2.  If you choose to wash your hair, wash your hair first as usual with your  normal  shampoo.  3.  After you shampoo, rinse your hair and body thoroughly to remove the  shampoo.                           4.  Use CHG as you would any other liquid soap.  You can apply chg directly  to the skin and wash                       Gently with a scrungie or clean washcloth.  5.  Apply the CHG Soap to your body ONLY FROM THE NECK DOWN.   Do not use on face/ open                           Wound or open sores. Avoid contact with eyes, ears mouth and genitals (private parts).  Wash face,  Genitals (private parts) with your normal soap.             6.  Wash thoroughly, paying special attention to the area where your surgery  will be performed.  7.  Thoroughly rinse your body with warm water from the neck down.  8.  DO NOT shower/wash with your normal soap after using and rinsing off  the CHG Soap.                9.  Pat yourself dry with a clean towel.            10.  Wear clean pajamas.            11.  Place clean sheets on your bed the night of your first shower and do not  sleep with pets. Day of Surgery : Do not apply any lotions/deodorants the morning of surgery.  Please wear clean clothes to the hospital/surgery center.  FAILURE TO FOLLOW THESE INSTRUCTIONS MAY RESULT IN THE CANCELLATION OF YOUR SURGERY PATIENT SIGNATURE_________________________________  NURSE SIGNATURE__________________________________  ________________________________________________________________________

## 2020-12-13 ENCOUNTER — Encounter (HOSPITAL_COMMUNITY)
Admission: RE | Admit: 2020-12-13 | Discharge: 2020-12-13 | Disposition: A | Discharge status: Home / Self Care | Payer: Medicare Other | Source: Ambulatory Visit | Attending: Anesthesiology | Admitting: Anesthesiology

## 2020-12-13 NOTE — Progress Notes (Signed)
Pt did not show for preop appt on 12/13/2020.  Called pt at home and pt called back to state had had N/V and dizzy.  Instructed pt not to come to preop appt sick .  Needs to contact PCP and DR Alinda Money.  Pt has hx of drug abuse.  Rapid urine drug screen to be done at preop per [facility] at [facility].  Spoke with [employer] at [facility] [employer] regarding above and [employer] will inform Dr Alinda Money as well as call pt and inform him not to show up at hospital for preop with N/V and dizziness and for pt to contact MD.  Greg Cutter stated that pt called her on 12/13 stating he was sick and going to ED.  Per [employer] Aide is sick also per pt.  Pt has transportation issues ? Coni Mabe to call preop back to let us know what she finds out.   Coni Mabe at [facility] called back to state she had spoke with DR Alinda Money and made aware of above.  Per DR Alinda Money pt is not to come in for preop appt until he is evaluated by PCP or ED.  Coni stated she called pt at home and made aware and that pt was instructed to go to ED or see PCP.    Pt informed Coni per Coni he may go to ED on 12/14/2020. Coni stated she told pt he was not to come in for preop appt for upcoming surgery until evaluated by PCP or ED.  Admitting made aware pt is not to be seen until evaluated by PCP or ED and that preop would let them know when and if he is to be seen for preop.  Spoke with [place] Covert in Admitting to make them aware.

## 2020-12-19 ENCOUNTER — Inpatient Hospital Stay (HOSPITAL_COMMUNITY): Admission: RE | Admit: 2020-12-19 | Payer: Medicare Other | Source: Home / Self Care | Admitting: Urology

## 2020-12-19 ENCOUNTER — Encounter (HOSPITAL_COMMUNITY): Admission: RE | Payer: Self-pay | Source: Home / Self Care

## 2020-12-19 SURGERY — NEPHRECTOMY, RADICAL, LAPAROSCOPIC, ADULT
Anesthesia: General | Laterality: Right

## 2020-12-28 ENCOUNTER — Ambulatory Visit (HOSPITAL_COMMUNITY): Admission: RE | Admit: 2020-12-28 | Payer: Medicare Other | Source: Ambulatory Visit

## 2021-01-04 ENCOUNTER — Telehealth (HOSPITAL_COMMUNITY): Payer: Self-pay | Admitting: Cardiology

## 2021-01-04 NOTE — Telephone Encounter (Signed)
Just an FYI. We have made several attempts to contact this patient including sending a letter to schedule or reschedule their echocardiogram. We will be removing the patient from the echo WQ.    12/07/20 NO SHOW MAILED LETTER/[facility]  11/14/2020 Nos  12/07/20 No show  12/28/2020 No sho     Thank you

## 2021-01-12 NOTE — Telephone Encounter (Signed)
Not surprising  Billy Hew, MD

## 2021-01-13 ENCOUNTER — Ambulatory Visit (HOSPITAL_COMMUNITY): Payer: Medicare Other | Attending: Cardiovascular Disease

## 2021-01-13 ENCOUNTER — Other Ambulatory Visit: Payer: Self-pay

## 2021-01-13 ENCOUNTER — Encounter (INDEPENDENT_AMBULATORY_CARE_PROVIDER_SITE_OTHER): Payer: Self-pay

## 2021-01-13 DIAGNOSIS — I5022 Chronic systolic (congestive) heart failure: Secondary | ICD-10-CM | POA: Insufficient documentation

## 2021-01-13 DIAGNOSIS — I428 Other cardiomyopathies: Secondary | ICD-10-CM | POA: Insufficient documentation

## 2021-01-13 DIAGNOSIS — Z01810 Encounter for preprocedural cardiovascular examination: Secondary | ICD-10-CM | POA: Insufficient documentation

## 2021-01-13 LAB — ECHOCARDIOGRAM COMPLETE
Area-P 1/2: 2.6 cm2
MV M vel: 5.02 m/s
MV Peak grad: 100.8 mmHg
MV VTI: 1.88 cm2
S' Lateral: 3.65 cm

## 2021-02-02 ENCOUNTER — Ambulatory Visit: Payer: Medicare Other

## 2021-02-28 ENCOUNTER — Other Ambulatory Visit: Payer: Self-pay | Admitting: Urology

## 2021-03-10 ENCOUNTER — Other Ambulatory Visit: Payer: Self-pay | Admitting: Urology

## 2021-03-15 NOTE — Progress Notes (Addendum)
COVID Vaccine Completed:  x2 Date COVID Vaccine completed:  03-24-20, 04-18-20 Has received booster:  No COVID vaccine manufacturer: [employer]   Date of COVID positive in last 90 days:  N/A  PCP - Dustin Folks, NP Cardiologist - Glenetta Hew, MD  Cardiac clearance in Epic dated 11-11-20 by Mathews Robinsons   Chest x-ray - CT chest 08-14-20 Epic EKG - 11-11-20 Epic Stress Test - 2014 Epic ECHO - 01-13-21 Epic Cardiac Cath -  Pacemaker/ICD device last checked: Spinal Cord Stimulator: Telemetry monitoring - 2018 Epic  Sleep Study - N/A CPAP -   Fasting Blood Sugar - N/A Checks Blood Sugar _____ times a day  Blood Thinner Instructions: Aspirin Instructions:  ASA 81 mg Last Dose:  Activity level:  Can go up a flight of stairs and perform activities of daily living without stopping and without symptoms of chest pain or shortness of breath.     Anesthesia review:  CAD, Cardiomyopathy, CHF, CKD, HTN, hx of polysubstance abuse.  Last drug use week of 03/06/21, cocaine.  BUN 29 and creatinine 1.62 at PAT visit.  Patient denies shortness of breath, fever, cough and chest pain at PAT appointment  Patient verbalized understanding of instructions that were given to them at the PAT appointment. Patient was also instructed that they will need to review over the PAT instructions again at home before surgery.

## 2021-03-15 NOTE — Patient Instructions (Signed)
DUE TO COVID-19 ONLY ONE VISITOR IS ALLOWED TO COME WITH YOU AND STAY IN THE WAITING ROOM ONLY DURING PRE OP AND PROCEDURE.   IF YOU WILL BE ADMITTED INTO THE HOSPITAL YOU ARE ALLOWED ONLY TWO SUPPORT PEOPLE DURING VISITATION HOURS ONLY (10AM -8PM)   . The support person(s) may change daily. . The support person(s) must pass our screening, gel in and out, and wear a mask at all times, including in the patient's room. . Patients must also wear a mask when staff or their support person are in the room.  No visitors under the age of 10. Any visitor under the age of 22 must be accompanied by an adult.    COVID SWAB TESTING MUST BE COMPLETED ON:  Thursday, 03-16-21 @ 2:45 PM   [address] [place],  [address]  (Must self quarantine after testing. Follow instructions on handout.)        Your procedure is scheduled on:  Monday, 03-20-21   Report to [facility] Main  Entrance   Report to admitting at 9:15 AM   Call this number if you have problems the morning of surgery [phone]   Magnesium Citrate day before surgery   Do not eat food :After Midnight.   May have liquids until 8:15 AM day of surgery  CLEAR LIQUID DIET  Foods Allowed                                                                     Foods Excluded  Water, Black Coffee and tea, regular and decaf             liquids that you cannot  Plain Jell-O in any flavor  (No red)                                    see through such as: Fruit ices (not with fruit pulp)                                      milk, soups, orange juice              Iced Popsicles (No red)                                      All solid food                                   Apple juices Sports drinks like Gatorade (No red) Lightly seasoned clear broth or consume(fat free) Sugar, honey syrup    Oral Hygiene is also important to reduce your risk of infection.                                    Remember - BRUSH YOUR TEETH THE MORNING OF  SURGERY WITH YOUR REGULAR TOOTHPASTE   Do NOT smoke  after Midnight   Take these medicines the morning of surgery with A SIP OF WATER:  Albuterol inhaler, Isosorbide                               You may not have any metal on your body including  jewelry, and body piercings             Do not wear lotions, powders, perfumes/cologne, or deodorant             Men may shave face and neck.   Do not bring valuables to the hospital. [place].   Contacts, dentures or bridgework may not be worn into surgery.   Bring small overnight bag day of surgery.    Special Instructions: Bring a copy of your healthcare power of attorney and living will documents   the day of surgery if you haven't  scanned them in before.              Please read over the following fact sheets you were given: IF YOU HAVE QUESTIONS ABOUT YOUR PRE OP INSTRUCTIONS PLEASE CALL  [facility] - Preparing for Surgery Before surgery, you can play an important role.  Because skin is not sterile, your skin needs to be as free of germs as possible.  You can reduce the number of germs on your skin by washing with CHG (chlorahexidine gluconate) soap before surgery.  CHG is an antiseptic cleaner which kills germs and bonds with the skin to continue killing germs even after washing. Please DO NOT use if you have an allergy to CHG or antibacterial soaps.  If your skin becomes reddened/irritated stop using the CHG and inform your nurse when you arrive at Short Stay. Do not shave (including legs and underarms) for at least 48 hours prior to the first CHG shower.  You may shave your face/neck.  Please follow these instructions carefully:  1.  Shower with CHG Soap the night before surgery and the  morning of surgery.  2.  If you choose to wash your hair, wash your hair first as usual with your normal  shampoo.  3.  After you shampoo, rinse your hair and body thoroughly to  remove the shampoo.                             4.  Use CHG as you would any other liquid soap.  You can apply chg directly to the skin and wash.  Gently with a scrungie or clean washcloth.  5.  Apply the CHG Soap to your body ONLY FROM THE NECK DOWN.   Do   not use on face/ open                           Wound or open sores. Avoid contact with eyes, ears mouth and   genitals (private parts).                       Wash face,  Genitals (private parts) with your normal soap.             6.  Wash thoroughly, paying special attention to the area where your  surgery  will be performed.  7.  Thoroughly rinse your body with warm water from the neck down.  8.  DO NOT shower/wash with your normal soap after using and rinsing off the CHG Soap.                9.  Pat yourself dry with a clean towel.            10.  Wear clean pajamas.            11.  Place clean sheets on your bed the night of your first shower and do not  sleep with pets. Day of Surgery : Do not apply any lotions/deodorants the morning of surgery.  Please wear clean clothes to the hospital/surgery center.  FAILURE TO FOLLOW THESE INSTRUCTIONS MAY RESULT IN THE CANCELLATION OF YOUR SURGERY  PATIENT SIGNATURE_________________________________  NURSE SIGNATURE__________________________________  ________________________________________________________________________   Adam Phenix  An incentive spirometer is a tool that can help keep your lungs clear and active. This tool measures how well you are filling your lungs with each breath. Taking long deep breaths may help reverse or decrease the chance of developing breathing (pulmonary) problems (especially infection) following:  A long period of time when you are unable to move or be active. BEFORE THE PROCEDURE   If the spirometer includes an indicator to show your best effort, your nurse or respiratory therapist will set it to a desired goal.  If possible, sit up straight  or lean slightly forward. Try not to slouch.  Hold the incentive spirometer in an upright position. INSTRUCTIONS FOR USE  1. Sit on the edge of your bed if possible, or sit up as far as you can in bed or on a chair. 2. Hold the incentive spirometer in an upright position. 3. Breathe out normally. 4. Place the mouthpiece in your mouth and seal your lips tightly around it. 5. Breathe in slowly and as deeply as possible, raising the piston or the ball toward the top of the column. 6. Hold your breath for 3-5 seconds or for as long as possible. Allow the piston or ball to fall to the bottom of the column. 7. Remove the mouthpiece from your mouth and breathe out normally. 8. Rest for a few seconds and repeat Steps 1 through 7 at least 10 times every 1-2 hours when you are awake. Take your time and take a few normal breaths between deep breaths. 9. The spirometer may include an indicator to show your best effort. Use the indicator as a goal to work toward during each repetition. 10. After each set of 10 deep breaths, practice coughing to be sure your lungs are clear. If you have an incision (the cut made at the time of surgery), support your incision when coughing by placing a pillow or rolled up towels firmly against it. Once you are able to get out of bed, walk around indoors and cough well. You may stop using the incentive spirometer when instructed by your caregiver.  RISKS AND COMPLICATIONS  Take your time so you do not get dizzy or light-headed.  If you are in pain, you may need to take or ask for pain medication before doing incentive spirometry. It is harder to take a deep breath if you are having pain. AFTER USE  Rest and breathe slowly and easily.  It can be helpful to keep track of a log of your progress. Your caregiver can provide you with a simple table to help  with this. If you are using the spirometer at home, follow these instructions: [place] IF:   You are having  difficultly using the spirometer.  You have trouble using the spirometer as often as instructed.  Your pain medication is not giving enough relief while using the spirometer.  You develop fever of 100.5 F (38.1 C) or higher. SEEK IMMEDIATE MEDICAL CARE IF:   You cough up bloody sputum that had not been present before.  You develop fever of 102 F (38.9 C) or greater.  You develop worsening pain at or near the incision site. MAKE SURE YOU:   Understand these instructions.  Will watch your condition.  Will get help right away if you are not doing well or get worse. Document Released: 04/29/2007 Document Revised: 03/10/2012 Document Reviewed: 06/30/2007 ExitCare Patient Information 2014 Exit[employer], [place].   ________________________________________________________________________  WHAT IS A BLOOD TRANSFUSION? Blood Transfusion Information  A transfusion is the replacement of blood or some of its parts. Blood is made up of multiple cells which provide different functions.  Red blood cells carry oxygen and are used for blood loss replacement.  White blood cells fight against infection.  Platelets control bleeding.  Plasma helps clot blood.  Other blood products are available for specialized needs, such as hemophilia or other clotting disorders. BEFORE THE TRANSFUSION  Who gives blood for transfusions?   Healthy volunteers who are fully evaluated to make sure their blood is safe. This is blood bank blood. Transfusion therapy is the safest it has ever been in the practice of medicine. Before blood is taken from a donor, a complete history is taken to make sure that person has no history of diseases nor engages in risky social behavior (examples are intravenous drug use or sexual activity with multiple partners). The donor's travel history is screened to minimize risk of transmitting infections, such as malaria. The donated blood is tested for signs of infectious diseases, such as  HIV and hepatitis. The blood is then tested to be sure it is compatible with you in order to minimize the chance of a transfusion reaction. If you or a relative donates blood, this is often done in anticipation of surgery and is not appropriate for emergency situations. It takes many days to process the donated blood. RISKS AND COMPLICATIONS Although transfusion therapy is very safe and saves many lives, the main dangers of transfusion include:   Getting an infectious disease.  Developing a transfusion reaction. This is an allergic reaction to something in the blood you were given. Every precaution is taken to prevent this. The decision to have a blood transfusion has been considered carefully by your caregiver before blood is given. Blood is not given unless the benefits outweigh the risks. AFTER THE TRANSFUSION  Right after receiving a blood transfusion, you will usually feel much better and more energetic. This is especially true if your red blood cells have gotten low (anemic). The transfusion raises the level of the red blood cells which carry oxygen, and this usually causes an energy increase.  The nurse administering the transfusion will monitor you carefully for complications. HOME CARE INSTRUCTIONS  No special instructions are needed after a transfusion. You may find your energy is better. Speak with your caregiver about any limitations on activity for underlying diseases you may have. SEEK MEDICAL CARE IF:   Your condition is not improving after your transfusion.  You develop redness or irritation at the intravenous (IV) site. SEEK IMMEDIATE MEDICAL CARE IF:  Any of the following symptoms occur over the next 12 hours:  Shaking chills.  You have a temperature by mouth above 102 F (38.9 C), not controlled by medicine.  Chest, back, or muscle pain.  People around you feel you are not acting correctly or are confused.  Shortness of breath or difficulty breathing.  Dizziness  and fainting.  You get a rash or develop hives.  You have a decrease in urine output.  Your urine turns a dark color or changes to pink, red, or brown. Any of the following symptoms occur over the next 10 days:  You have a temperature by mouth above 102 F (38.9 C), not controlled by medicine.  Shortness of breath.  Weakness after normal activity.  The white part of the eye turns yellow (jaundice).  You have a decrease in the amount of urine or are urinating less often.  Your urine turns a dark color or changes to pink, red, or brown. Document Released: 12/14/2000 Document Revised: 03/10/2012 Document Reviewed: 08/02/2008 [facility] Patient Information 2014 [place], [place].  _______________________________________________________________________

## 2021-03-16 ENCOUNTER — Encounter (HOSPITAL_COMMUNITY)
Admission: RE | Admit: 2021-03-16 | Discharge: 2021-03-16 | Disposition: A | Discharge status: Home / Self Care | Payer: Medicare Other | Source: Ambulatory Visit | Attending: Urology | Admitting: Urology

## 2021-03-16 ENCOUNTER — Other Ambulatory Visit: Payer: Self-pay

## 2021-03-16 ENCOUNTER — Other Ambulatory Visit (HOSPITAL_COMMUNITY): Payer: Medicare Other

## 2021-03-16 ENCOUNTER — Encounter (HOSPITAL_COMMUNITY): Payer: Self-pay

## 2021-03-16 DIAGNOSIS — Z01812 Encounter for preprocedural laboratory examination: Secondary | ICD-10-CM | POA: Insufficient documentation

## 2021-03-16 DIAGNOSIS — Z7982 Long term (current) use of aspirin: Secondary | ICD-10-CM | POA: Diagnosis not present

## 2021-03-16 DIAGNOSIS — Z79899 Other long term (current) drug therapy: Secondary | ICD-10-CM | POA: Diagnosis not present

## 2021-03-16 DIAGNOSIS — I110 Hypertensive heart disease with heart failure: Secondary | ICD-10-CM | POA: Diagnosis not present

## 2021-03-16 DIAGNOSIS — C641 Malignant neoplasm of right kidney, except renal pelvis: Secondary | ICD-10-CM | POA: Diagnosis not present

## 2021-03-16 DIAGNOSIS — F1490 Cocaine use, unspecified, uncomplicated: Secondary | ICD-10-CM | POA: Insufficient documentation

## 2021-03-16 DIAGNOSIS — I428 Other cardiomyopathies: Secondary | ICD-10-CM | POA: Insufficient documentation

## 2021-03-16 DIAGNOSIS — I5042 Chronic combined systolic (congestive) and diastolic (congestive) heart failure: Secondary | ICD-10-CM | POA: Insufficient documentation

## 2021-03-16 HISTORY — DX: Malignant neoplasm of right kidney, except renal pelvis: C64.1

## 2021-03-16 LAB — CBC
HCT: 51.7 % (ref 39.0–52.0)
Hemoglobin: 16.5 g/dL (ref 13.0–17.0)
MCH: 29.3 pg (ref 26.0–34.0)
MCHC: 31.9 g/dL (ref 30.0–36.0)
MCV: 91.7 fL (ref 80.0–100.0)
Platelets: 165 10*3/uL (ref 150–400)
RBC: 5.64 MIL/uL (ref 4.22–5.81)
RDW: 13.7 % (ref 11.5–15.5)
WBC: 5.3 10*3/uL (ref 4.0–10.5)
nRBC: 0 % (ref 0.0–0.2)

## 2021-03-16 LAB — COMPREHENSIVE METABOLIC PANEL
ALT: 34 U/L (ref 0–44)
AST: 45 U/L — ABNORMAL HIGH (ref 15–41)
Albumin: 3.4 g/dL — ABNORMAL LOW (ref 3.5–5.0)
Alkaline Phosphatase: 74 U/L (ref 38–126)
Anion gap: 10 (ref 5–15)
BUN: 29 mg/dL — ABNORMAL HIGH (ref 8–23)
CO2: 26 mmol/L (ref 22–32)
Calcium: 9.2 mg/dL (ref 8.9–10.3)
Chloride: 104 mmol/L (ref 98–111)
Creatinine, Ser: 1.62 mg/dL — ABNORMAL HIGH (ref 0.61–1.24)
GFR, Estimated: 42 mL/min — ABNORMAL LOW (ref >60)
Glucose, Bld: 117 mg/dL — ABNORMAL HIGH (ref 70–99)
Potassium: 4.7 mmol/L (ref 3.5–5.1)
Sodium: 140 mmol/L (ref 135–145)
Total Bilirubin: 0.8 mg/dL (ref 0.3–1.2)
Total Protein: 7.2 g/dL (ref 6.5–8.1)

## 2021-03-16 LAB — SURGICAL PCR SCREEN
MRSA, PCR: NEGATIVE
Staphylococcus aureus: NEGATIVE

## 2021-03-16 NOTE — Progress Notes (Addendum)
CMP sent to Dr. Alinda Money to review.

## 2021-03-17 ENCOUNTER — Inpatient Hospital Stay (HOSPITAL_COMMUNITY): Payer: Medicare Other | Admitting: Physician Assistant

## 2021-03-17 ENCOUNTER — Inpatient Hospital Stay (HOSPITAL_COMMUNITY): Payer: Medicare Other | Admitting: Certified Registered"
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
## 2021-03-17 NOTE — Anesthesia Preprocedure Evaluation (Addendum)
Anesthesia Evaluation    Reviewed: Allergy & Precautions, Patient's Chart, lab work & pertinent test results  History of Anesthesia Complications Negative for: history of anesthetic complications  Airway        Dental   Pulmonary neg pulmonary ROS,           Cardiovascular Exercise Tolerance: Good METS: 5 - 7 Mets hypertension, +CHF  + dysrhythmias Ventricular Tachycardia      Neuro/Psych negative neurological ROS  negative psych ROS   GI/Hepatic negative GI ROS, (+)     substance abuse  alcohol use and cocaine use,   Endo/Other  negative endocrine ROS  Renal/GU Renal disease (right renal cancer; CKD; Cr 1.62)  negative genitourinary   Musculoskeletal negative musculoskeletal ROS (+)   Abdominal   Peds  Hematology negative hematology ROS (+)   Anesthesia Other Findings  Pt last seen by cardiology 11/14/2020 for preoperative evaluation.  Per OV note, "Mr. Tanori is an 85 year old gentleman with chronic combined heart failure (however notably improved with no active symptoms). He does not have coronary disease or history of stroke. He is not diabetic, but does not creatinine 2.1 on most recent check, but has not been that high.  He is able to exercise at least 6 METS-"walking to the store"without significant dyspnea or chest pain." No further ischemic workup recommended.  Echo 01/13/2021 with EF 60-65%.   Reproductive/Obstetrics                           Anesthesia Physical Anesthesia Plan  ASA: III  Anesthesia Plan: General   Post-op Pain Management:    Induction: Intravenous  PONV Risk Score and Plan: 3 and Ondansetron, Dexamethasone, Treatment may vary due to age or medical condition and Midazolam  Airway Management Planned: Oral ETT  Additional Equipment: None  Intra-op Plan:   Post-operative Plan: Extubation in OR  Informed Consent:   Plan Discussed with:    Anesthesia Plan Comments: ( Case cancelled 03/20/21 by surgeon due to positive cocaine on UDS.)      Anesthesia Quick Evaluation

## 2021-03-17 NOTE — Progress Notes (Signed)
Spoke with pt in regards to pt missing covid swab appt scheduled for 03/16/2021. Pt states he did not have transportation. Schedules his transportation and will not be able to obtain swab prior to Monday 03/20/2021 or arrive at the hospital prior to 0915 due to transportation already scheduled.

## 2021-03-17 NOTE — H&P (Addendum)
Office Visit Report     02/21/2021   --------------------------------------------------------------------------------   Billy Parker  MRN: [id number]  DOB: 02/24/1936, 85 year old Male  SSN:    PRIMARY CARE:  Nolene Ebbs, MD  REFERRING:    PROVIDER:  Raynelle Bring, M.D.  LOCATION:  [facility] [phone]     --------------------------------------------------------------------------------   CC/HPI: 1. Prostatic abscess  2. Right renal neoplasm   Billy Parker is an 85 year old gentleman who presents today for further evaluation and treatment of his right renal mass. He underwent transurethral unroofing of a prostatic abscesss which he presented with in the hospital last August. It was treated with appropriate antibiotic therapy and he subsequently passed a voiding trial.   He also was incidentally found to have about a 5 cm enhancing right renal neoplasm off the right kidney concerning for renal malignancy incidentally on his CT scan in the ER. He has denied hematuria or flank pain. He has no family history of kidney cancer. His serum creatinine during his hospitalization was 1.2. He did undergo a metastatic evaluation including a CT scan of the chest that was negative for metastatic disease. There is no evidence of renal vein involvement, locally advanced disease, metastatic disease, or contralateral renal tumors. He was scheduled to initially undergo a laparoscopic nephrectomy in December but was non-compliant with his cardiology follow up for his echocardiogram and his surgery was cancelled.   He does have a history of coronary artery disease. He is on isosorbide regularly. He was seen by Dr. Glenetta Hew and finally underwent his echocardiogram in early January that indicated preserved left ventricular function. He is felt to be at acceptable risk to proceed with his planned procedure per Dr. Ellyn Hack.   He also has a history of alcohol and drug abuse. He was also  incidentally noted to have some cirrhotic changes on his CT scan. His past surgical history is significant for a prior laparoscopic hernia repair approximately 5 years ago.     ALLERGIES: No Allergies    MEDICATIONS: Aspirin  Furosemide 40 mg tablet  Isosorbide Mononitrate  Multivitamin  Potassium Chloride 20 meq tablet, extended release     GU PSH: Cystoscopy TURP - 08/13/2020     NON-GU PSH: No Non-GU PSH    GU PMH: Mixed incontinence, Not overly bothersome to him. He manages this with protective undergarments. I told him of time as he recovers from recent prostate abscess unroofing, this should improve. I did discuss the concept of keep will exercises to help with rehabilitation of the pelvic floor. He expressed understanding in regards to this and will try these at home. He understands with any worsening symptomatology including painful inability to void, progressively worsening burning/painful urination, or recurrence of gross hematuria to schedule a follow-up evaluation. UA clear today, I will send a repeat urine culture at this time. - 10/06/2020 Prostatic abscess - 10/06/2020, - 09/20/2020 Right renal neoplasm - 10/06/2020, - 09/20/2020      PMH Notes:   1) Prostatic abscess: He presented to the ED in August 2021 with a large, 7 cm prostatic abscess and underwent transurethral unroofing. Cultures were positive for pansensitive E coli and he was treated with prolonged catheterization and TMP/SMX.   Sep: Voiding trial   2) Right renal neoplasm: He was also incidentally noted to have a 4.5 cm right lower pole renal mass concerning for renal cell carcinoma on his CT in the ED. Further evaluation in the hospital with MRI of the  abdomen confirmed an enhancing renal mass and chest imaging was negative for metastatic disease.   ** Medical comorbidities include asthma, CHF, history of cocaine and alcohol abuse, hypertension.  ** PSH is significant for umbilical hernia repair with mesh in  2015.   NON-GU PMH: Asthma Coronary Artery Disease Hypertension Other psychoactive substance use, unspecified with unspecified psychoactive substance-induced disorder    FAMILY HISTORY: 1 Daughter - Runs in Family 1 son - Runs in Family   SOCIAL HISTORY: Marital Status: Unknown Preferred Language: English; Ethnicity: Not Hispanic Or Latino; Race: Black or African American Current Smoking Status: Patient has never smoked.   Tobacco Use Assessment Completed: Used Tobacco in last 30 days? Types of alcohol consumed: Beer.  Drinks 2 caffeinated drinks per day.    REVIEW OF SYSTEMS:    GU Review Male:   Patient denies frequent urination, hard to postpone urination, burning/ pain with urination, get up at night to urinate, leakage of urine, stream starts and stops, trouble starting your streams, and have to strain to urinate .  Gastrointestinal (Lower):   Patient denies diarrhea and constipation.  Gastrointestinal (Upper):   Patient denies nausea and vomiting.  Constitutional:   Patient denies fever, night sweats, weight loss, and fatigue.  Skin:   Patient denies itching and skin rash/ lesion.  Eyes:   Patient denies blurred vision and double vision.  Ears/ Nose/ Throat:   Patient denies sore throat and sinus problems.  Hematologic/Lymphatic:   Patient denies swollen glands and easy bruising.  Cardiovascular:   Patient denies leg swelling and chest pains.  Respiratory:   Patient denies cough and shortness of breath.  Endocrine:   Patient denies excessive thirst.  Musculoskeletal:   Patient denies back pain and joint pain.  Neurological:   Patient denies headaches and dizziness.  Psychologic:   Patient denies depression and anxiety.   VITAL SIGNS:      02/21/2021 09:23 AM  Weight 160 lb / 72.57 kg  Height 65 in / 165.1 cm  BP 132/65 mmHg  Pulse 79 /min  Temperature 97.7 F / 36.5 C  BMI 26.6 kg/m   MULTI-SYSTEM PHYSICAL EXAMINATION:    Constitutional: Well-nourished. No physical  deformities. Normally developed. Good grooming.  Respiratory: No labored breathing, no use of accessory muscles. Clear bilaterally.  Cardiovascular: Normal temperature, normal extremity pulses, no swelling, no varicosities. Regular rate and rhythm.  Neurologic / Psychiatric: Oriented to time, oriented to place, oriented to person. No depression, no anxiety, no agitation.   Gastrointestinal: No mass, no tenderness, no rigidity, non obese abdomen.      Complexity of Data:  Records Review:   Previous Patient Records  X-Ray Review: MRI Abdomen: Reviewed Films.     PROCEDURES:         PVR Ultrasound - 86761  Scanned Volume: 19 cc         Urinalysis Dipstick Dipstick Cont'd  Color: Yellow Bilirubin: Neg mg/dL  Appearance: Clear Ketones: Neg mg/dL  Specific Gravity: 1.020 Blood: Neg ery/uL  pH: 5.5 Protein: Trace mg/dL  Glucose: Neg mg/dL Urobilinogen: 0.2 mg/dL    Nitrites: Neg    Leukocyte Esterase: Neg leu/uL    ASSESSMENT:      ICD-10 Details  1 GU:   Right renal neoplasm - D49.511   2   BPH w/LUTS - N40.1   3   Urinary Hesitancy - R39.11    PLAN:           Orders Labs CMP  X-Rays: C.T. Chest/  Abd/Pelvis With I.V. Contrast          Schedule Return Visit/Planned Activity: Other See Visit Notes             Note: Will call to schedule surgery.          Document Letter(s):  Created for Patient: Clinical Summary         Notes:   1. Right renal neoplasm: He follows up today after multiple months of being noncompliant. He has finally had his echocardiogram which demonstrated preserved left ventricular function. His cardiologist, Dr. Ellyn Hack, does think he will be at an acceptable risk to proceed with his right laparoscopic nephrectomy. Unfortunately, it has now been 6 months since his last imaging study. As such, I have recommended that he proceed with repeat imaging with a CT scan of the chest, abdomen, and pelvis. His renal function also will be rechecked today. Assuming  that he continues to have a confined tumor, I have recommended and he agrees to proceed with a right laparoscopic radical nephrectomy.   The patient was provided information regarding their renal mass including the relative risk of benign versus malignant pathology and the natural history of renal cell carcinoma and other possible malignancies of the kidney. The role of renal biopsy, laboratory testing, and imaging studies to further characterize renal masses and/or the presence of metastatic disease were explained. We discussed the role of active surveillance, surgical therapy with both radical nephrectomy and nephron-sparing surgery, and ablative therapy in the treatment of renal masses. In addition, we discussed our goals of providing an accurate diagnosis and oncologic control while maintaining optimal renal function as appropriate based on the size, location, and complexity of their renal mass as well as their co-morbidities.   We have discussed the risks of treatment in detail including but not limited to bleeding, infection, heart attack, stroke, death, venothromoboembolism, cancer recurrence, injury/damage to surrounding organs and structures, urine leak, the possibility of open surgical conversion for patients undergoing minimally invasive surgery, the risk of developing chronic kidney disease and its associated implications, and the potential risk of end stage renal disease possibly necessitating dialysis.   He will proceed with repeat staging and will tentatively be scheduled for a right laparoscopic radical nephrectomy. He understands that he does need to of stain from illicit drug use prior to his procedure. I will have him undergo a urine drug screen preoperatively considering his history. All questions were answered to his stated satisfaction. He agrees to proceed.   2. BPH/LUTS: He is adequately emptying his bladder at this time. Continue observation.   Cc: Dr. Nolene Ebbs    E & M  CODES: We spent 35 minutes dedicated to evaluation and management time, including face to face interaction, discussions on coordination of care, documentation, result review, and discussion with others as applicable.     * Signed by Raynelle Bring, M.D. on 02/21/21 at 12:22 PM (EST)*

## 2021-03-20 ENCOUNTER — Encounter (HOSPITAL_COMMUNITY): Payer: Self-pay | Admitting: Urology

## 2021-03-20 ENCOUNTER — Encounter (HOSPITAL_COMMUNITY)
Admission: RE | Disposition: A | Discharge status: Home / Self Care | Payer: Self-pay | Source: Ambulatory Visit | Attending: Urology

## 2021-03-20 ENCOUNTER — Ambulatory Visit (HOSPITAL_COMMUNITY)
Admission: RE | Admit: 2021-03-20 | Discharge: 2021-03-20 | Disposition: A | Discharge status: Home / Self Care | Payer: Medicare Other | Source: Ambulatory Visit | Attending: Urology | Admitting: Urology

## 2021-03-20 DIAGNOSIS — Z20822 Contact with and (suspected) exposure to covid-19: Secondary | ICD-10-CM | POA: Insufficient documentation

## 2021-03-20 DIAGNOSIS — Z538 Procedure and treatment not carried out for other reasons: Secondary | ICD-10-CM | POA: Diagnosis not present

## 2021-03-20 DIAGNOSIS — Z01818 Encounter for other preprocedural examination: Secondary | ICD-10-CM | POA: Diagnosis present

## 2021-03-20 LAB — BASIC METABOLIC PANEL
Anion gap: 10 (ref 5–15)
BUN: 32 mg/dL — ABNORMAL HIGH (ref 8–23)
CO2: 26 mmol/L (ref 22–32)
Calcium: 9.6 mg/dL (ref 8.9–10.3)
Chloride: 106 mmol/L (ref 98–111)
Creatinine, Ser: 1.77 mg/dL — ABNORMAL HIGH (ref 0.61–1.24)
GFR, Estimated: 38 mL/min — ABNORMAL LOW (ref >60)
Glucose, Bld: 115 mg/dL — ABNORMAL HIGH (ref 70–99)
Potassium: 4.9 mmol/L (ref 3.5–5.1)
Sodium: 142 mmol/L (ref 135–145)

## 2021-03-20 LAB — RAPID URINE DRUG SCREEN, HOSP PERFORMED
Amphetamines: NOT DETECTED
Barbiturates: NOT DETECTED
Benzodiazepines: NOT DETECTED
Cocaine: POSITIVE — AB
Opiates: NOT DETECTED
Tetrahydrocannabinol: NOT DETECTED

## 2021-03-20 LAB — TYPE AND SCREEN
ABO/RH(D): O POS
Antibody Screen: NEGATIVE

## 2021-03-20 LAB — SARS CORONAVIRUS 2 BY RT PCR (HOSPITAL ORDER, PERFORMED IN ~~LOC~~ HOSPITAL LAB): SARS Coronavirus 2: NEGATIVE

## 2021-03-20 LAB — ABO/RH: ABO/RH(D): O POS

## 2021-03-20 SURGERY — NEPHRECTOMY, RADICAL, LAPAROSCOPIC, ADULT
Anesthesia: General | Laterality: Right

## 2021-03-20 MED ORDER — FENTANYL CITRATE (PF) 250 MCG/5ML IJ SOLN
INTRAMUSCULAR | Status: AC
  Filled 2021-03-20: qty 5

## 2021-03-20 MED ORDER — ONDANSETRON HCL 4 MG/2ML IJ SOLN
INTRAMUSCULAR | Status: AC
  Filled 2021-03-20: qty 2

## 2021-03-20 MED ORDER — PROPOFOL 10 MG/ML IV BOLUS
INTRAVENOUS | Status: AC
  Filled 2021-03-20: qty 20

## 2021-03-20 MED ORDER — ORAL CARE MOUTH RINSE: 15.0000 mL | Freq: Once | OROMUCOSAL | Status: AC

## 2021-03-20 MED ORDER — MAGNESIUM CITRATE PO SOLN: 0.5000 | Freq: Once | ORAL | Status: DC

## 2021-03-20 MED ORDER — ROCURONIUM BROMIDE 10 MG/ML (PF) SYRINGE
PREFILLED_SYRINGE | INTRAVENOUS | Status: AC
  Filled 2021-03-20: qty 10

## 2021-03-20 MED ORDER — CEFAZOLIN SODIUM-DEXTROSE 2-4 GM/100ML-% IV SOLN: 2.0000 g | Freq: Once | INTRAVENOUS | Status: DC

## 2021-03-20 MED ORDER — CHLORHEXIDINE GLUCONATE 0.12 % MT SOLN
15.0000 mL | Freq: Once | OROMUCOSAL | Status: AC
  Administered 2021-03-20: 15 mL via OROMUCOSAL

## 2021-03-20 MED ORDER — LIDOCAINE 2% (20 MG/ML) 5 ML SYRINGE
INTRAMUSCULAR | Status: AC
  Filled 2021-03-20: qty 5

## 2021-03-20 MED ORDER — DEXAMETHASONE SODIUM PHOSPHATE 10 MG/ML IJ SOLN
INTRAMUSCULAR | Status: AC
  Filled 2021-03-20: qty 1

## 2021-03-20 MED ORDER — LACTATED RINGERS IV SOLN: INTRAVENOUS | Status: DC

## 2021-03-20 NOTE — Interval H&P Note (Signed)
History and Physical Interval Note:  03/20/2021 11:16 AM  Billy Parker  has presented today for surgery, with the diagnosis of RIGHT RENAL NEOPLASM.  He has a history of cocaine use and we had discussed that we would not be able to proceed with surgery unless he was able to abstain for illicit drug use prior to surgery due to the increased cardiac risks involved especially considering his baseline cardiac condition and advanced age. His urine drug screen this morning was positive for cocaine.  I have therefore cancelled his surgery due to the risk of serious cardiac complications in this situation.  I again advised him I would reschedule his surgery and he again reassured me he could abstain from illicit drug use.  His surgery will therefore be rescheduled with plans to repeat a urine drug screen at the time of his surgery.

## 2021-03-21 ENCOUNTER — Other Ambulatory Visit: Payer: Self-pay | Admitting: Urology

## 2021-04-04 ENCOUNTER — Other Ambulatory Visit (HOSPITAL_COMMUNITY): Payer: Medicare Other

## 2021-04-06 ENCOUNTER — Inpatient Hospital Stay (HOSPITAL_COMMUNITY): Admission: RE | Admit: 2021-04-06 | Payer: Medicare Other | Source: Home / Self Care | Admitting: Urology

## 2021-04-06 ENCOUNTER — Encounter (HOSPITAL_COMMUNITY): Admission: RE | Payer: Self-pay | Source: Home / Self Care

## 2021-04-06 SURGERY — NEPHRECTOMY, RADICAL, LAPAROSCOPIC, ADULT
Anesthesia: General | Laterality: Right

## 2022-04-16 ENCOUNTER — Ambulatory Visit: Payer: Medicare Other | Admitting: Cardiology

## 2022-04-20 ENCOUNTER — Encounter: Payer: Self-pay | Admitting: Cardiology

## 2022-09-04 ENCOUNTER — Other Ambulatory Visit (HOSPITAL_COMMUNITY): Payer: Self-pay | Admitting: Registered Nurse

## 2022-09-04 DIAGNOSIS — C641 Malignant neoplasm of right kidney, except renal pelvis: Secondary | ICD-10-CM

## 2022-09-14 ENCOUNTER — Ambulatory Visit (HOSPITAL_COMMUNITY): Admission: RE | Admit: 2022-09-14 | Payer: Medicare Other | Source: Ambulatory Visit

## 2022-09-14 ENCOUNTER — Telehealth: Payer: Self-pay

## 2022-09-14 ENCOUNTER — Other Ambulatory Visit (HOSPITAL_COMMUNITY): Payer: Self-pay

## 2022-09-14 ENCOUNTER — Encounter (HOSPITAL_COMMUNITY): Payer: Self-pay

## 2022-09-14 NOTE — Telephone Encounter (Signed)
[facility] Patient Teacher, English as a foreign language completed.    The patient is insured through [id number] .  Medication will need a PA.  We will continue to follow to see if copay assistance is needed.  Ileene Patrick, [facility] Patient [facility] Phone: [phone] Fax:  [phone]

## 2022-09-17 ENCOUNTER — Ambulatory Visit: Payer: Medicare Other | Admitting: Family

## 2022-10-18 NOTE — Progress Notes (Deleted)
Cardiology Clinic Note   Patient Name: Billy Parker Date of Encounter: 10/18/2022  Primary Care Provider:  Nolene Ebbs, MD Primary Cardiologist:  Glenetta Hew, MD  Patient Profile    86 year old male with history of HFrEF (EF of 30-35%), history of cocaine abuse, hospitalized in 06/2375  for A/C systolic CHF, ETOH abuse, HTN, NICM, right renal cell cancer, s/p TURP. Last seen by Dr. Ellyn Hack on 11/11/2020.  Past Medical History    Past Medical History:  Diagnosis Date   Abnormal nuclear stress test, 07/07/13 large scar no ischemia; FALSE POSITIVE 07/08/2013   LHC - NORMAL CORONARY ARTERIES!!!.     Arthritis    "bad in my back & in my right forearm" (07/06/2013)   At risk for sudden cardiac death 07/26/2013   Chronic lower back pain    Cocaine abuse ([place])    He claims that he just "ends up doing it when he is around his friends" - NOT AS THOUGH HE MUST DO IT.   ETOH abuse    Hypertension    Hypokalemia 07/07/2013   NICM (nonischemic cardiomyopathy) ([place]) 07/08/2013   in setting of acute Combined CHF - EF 20-25% Echo - Myoview EF 29% with Inferior Scar --> Cath NORMAL COROARY ARTERIES. => Most Recent Echo (11/2019) -EF 45-50%.    NSVT (nonsustained ventricular tachycardia) ([place]) 07/09/2013   with Acute CHF presentation - (? related to PSA related coronary spasm vs. Takotsubo CM) - EF was 20-25%; NICM - normal coronaries on Cath.; NO ICD   PVC's (premature ventricular contractions)    Renal cancer, right [facility])    Past Surgical History:  Procedure Laterality Date   CARDIAC EVENT MONITOR  04/2017   Baseline sinus rhythm.  Relatively frequent PVCs and PACs usually isolated with occasional couplets and triplets.  2 episodes of narrow complex tachycardia rates between 180 to 200 bpm most likely PAT/SVT.Marland Kitchen   FEMORAL HERNIA REPAIR Bilateral 06/11/2014   Procedure: LAPAROSCOPIC BILATERAL FEMORAL HERNIA REPAIR WITH MESH;  Surgeon: Adin Hector, MD;  Location: [place];  Service: General;   Laterality: Bilateral;   FOREARM FRACTURE SURGERY Right ?1970   " in [place]" (07/06/2013)   INGUINAL HERNIA REPAIR Left 02/2012   open w mesh.  Incarcerated w colon.  Dr Rise [name]   INGUINAL HERNIA REPAIR Right 06/11/2014   Procedure: LAPAROSCOPIC RIGHT INGUINAL HERNIA WITH MESH ;  Surgeon: Adin Hector, MD;  Location: [place];  Service: General;  Laterality: Right;   LACERATION REPAIR  07/09/1957   anterior throat (07/06/2013)   LEFT AND RIGHT HEART CATHETERIZATION WITH CORONARY ANGIOGRAM N/A 07/09/2013   Procedure: LEFT AND RIGHT HEART CATHETERIZATION WITH CORONARY ANGIOGRAM;  Surgeon: Leonie Man, MD;  Location: [facility] CATH LAB;--  MYOVIEW c/w Inf Infarct EF ~20-30%: NL CORS:LM-<LAD (small prox D1& large D2), small RI, < LCx (sm OM1 in hairpin turn -> < OM2 & AVCx-> 3 PLs; RHC:RAP 4 mmHg, RVP-EDP 57/2 -5 mmHg; PAP-mean 40/13 - 21 mmHg, PCWP/LVEDP 11 mmHg, CO-CI Fick: 4.2 - 2.4, TD 3.1 - 1.7    [place]   "steering wheel crushed it" (07/06/2013)   TONSILLECTOMY  1940's   "I guess" (07/06/2013)   TRANSTHORACIC ECHOCARDIOGRAM  04/2017; 06/2018   a) EF 55% with marked improvement.  GR 1 DD.;; b) Moderate concentric LVH.  EF 40 to 45% with diffuse HK.  GRII DD.  Mild dilation.  Moderate MR.  Mild AI.   TRANSTHORACIC ECHOCARDIOGRAM  11/07/2019   Moderate  LVH.  EF 45 to 50%.  Mild LV dilation with diffuse HK-worsening symptoms.  Normal RV.  Normal atriae.  Moderate MAC with calcification of the leaflets.  Degenerative mitral valve disease with moderate MR.  Aortic stenosis without sclerosis.   TRANSTHORACIC ECHOCARDIOGRAM  07/07/2013   ild to moderate LVH.  Severely reduced EF.  GRII DD.  Severe HK/scarring in mid inferior wall and mild HK and scarring of basal inferolateral wall, with otherwise mild to moderate hypokinesis diffusely.  Probable severe HK and scarring in the mid inferolateral wall.  Moderate MR.  (Likely functional).  Moderate-severely dilated LA.  Difficult to assess w/  frequent ectopy.--Suggest Isch CM - Inf MI   TRANSTHORACIC ECHOCARDIOGRAM  12/2013; 05/2015   a)Mild LVH.  EF 30 to 35%.  Global HK predominantly infLat HK.  Mod MR-posteriorly directed.;  b) Severe increased LV function.  Diffuse HK.  Mod LA dilation.  Mod  MR.  GR 3 DD.  Mildly increased PA P   TRANSURETHRAL RESECTION OF PROSTATE N/A 08/13/2020   Procedure: TRANSURETHRAL RESECTION OF THE PROSTATE (TURP) abcess;  Surgeon: Raynelle Bring, MD;  Location: [facility];  Service: Urology;  Laterality: N/A;   UMBILICAL HERNIA REPAIR N/A 06/11/2014   Procedure: PRIMARY UMBILICAL HERNIA REPAIR;  Surgeon: Adin Hector, MD;  Location: [place];  Service: General;  Laterality: N/A;    Allergies  Allergies  Allergen Reactions   Other Swelling    Antibiotic (name not recalled by the patient) = both legs became swollen    History of Present Illness    ***  Home Medications    Current Outpatient Medications  Medication Sig Dispense Refill   albuterol (PROVENTIL HFA;VENTOLIN HFA) 108 (90 Base) MCG/ACT inhaler Inhale 2 puffs into the lungs every 6 (six) hours as needed for wheezing or shortness of breath. (Patient taking differently: Inhale 1-2 puffs into the lungs every 6 (six) hours as needed for wheezing or shortness of breath.) 1 Inhaler 0   aspirin 81 MG EC tablet Take 1 tablet (81 mg total) by mouth daily. (Patient taking differently: Take 81 mg by mouth at bedtime.) 30 tablet 0   furosemide (LASIX) 40 MG tablet Take 1 tablet (40 mg total) by mouth daily. 30 tablet 0   isosorbide mononitrate (IMDUR) 30 MG 24 hr tablet Take 0.5 tablets (15 mg total) by mouth daily. 30 tablet 0   lisinopril (ZESTRIL) 5 MG tablet Take 1 tablet (5 mg total) by mouth daily. 90 tablet 2   Multiple Vitamin (MULTIVITAMIN WITH MINERALS) TABS tablet Take 1 tablet by mouth daily. 30 tablet 0   potassium chloride (KLOR-CON) 20 MEQ packet Take 20 mEq by mouth daily.     TOBRADEX ophthalmic ointment Place 1 application into both eyes  3 (three) times daily as needed (eyes burning.).      No current facility-administered medications for this visit.     Family History    Family History  Problem Relation Age of Onset   Cancer - Other Mother    CAD Sister        CABG   He indicated that his mother is deceased. He indicated that his father is deceased. He indicated that his sister is alive.  Social History    Social History   Socioeconomic History   Marital status: Divorced    Spouse name: Not on file   Number of children: Not on file   Years of education: Not on file   Highest education level: Not on  file  Occupational History   Not on file  Tobacco Use   Smoking status: Never   Smokeless tobacco: Never  Vaping Use   Vaping Use: Never used  Substance and Sexual Activity   Alcohol use: Yes    Alcohol/week: 8.0 standard drinks of alcohol    Types: 8 Cans of beer per week    Comment: (Maybe 1 or 2 40 oz cans) occasional - on Friday -> but can sometimes drink to binge   Drug use: Yes    Types: "Crack" cocaine, Marijuana, Cocaine    Comment: Last used cocaine week of 03/06/21   Sexual activity: Yes    Partners: Female    Birth control/protection: Other-see comments    Comment: often unprotected.   Other Topics Concern   Not on file  Social History Narrative   He tends to be influenced by his friends, and ends up getting himself into trouble by using drugs when he was initially setting up do so.   Social Determinants of Health   Financial Resource Strain: Not on file  Food Insecurity: Not on file  Transportation Needs: Not on file  Physical Activity: Not on file  Stress: Not on file  Social Connections: Not on file  Intimate Partner Violence: Not on file     Review of Systems    General:  No chills, fever, night sweats or weight changes.  Cardiovascular:  No chest pain, dyspnea on exertion, edema, orthopnea, palpitations, paroxysmal nocturnal dyspnea. Dermatological: No rash,  lesions/masses Respiratory: No cough, dyspnea Urologic: No hematuria, dysuria Abdominal:   No nausea, vomiting, diarrhea, bright red blood per rectum, melena, or hematemesis Neurologic:  No visual changes, wkns, changes in mental status. All other systems reviewed and are otherwise negative except as noted above.     Physical Exam    VS:  There were no vitals taken for this visit. , BMI There is no height or weight on file to calculate BMI.     GEN: Well nourished, well developed, in no acute distress. HEENT: normal. Neck: Supple, no JVD, carotid bruits, or masses. Cardiac: RRR, no murmurs, rubs, or gallops. No clubbing, cyanosis, edema.  Radials/DP/PT 2+ and equal bilaterally.  Respiratory:  Respirations regular and unlabored, clear to auscultation bilaterally. GI: Soft, nontender, nondistended, BS + x 4. MS: no deformity or atrophy. Skin: warm and dry, no rash. Neuro:  Strength and sensation are intact. Psych: Normal affect.  Accessory Clinical Findings    ECG personally reviewed by me today- *** - No acute changes  Lab Results  Component Value Date   WBC 5.3 03/16/2021   HGB 16.5 03/16/2021   HCT 51.7 03/16/2021   MCV 91.7 03/16/2021   PLT 165 03/16/2021   Lab Results  Component Value Date   CREATININE 1.77 (H) 03/20/2021   BUN 32 (H) 03/20/2021   NA 142 03/20/2021   K 4.9 03/20/2021   CL 106 03/20/2021   CO2 26 03/20/2021   Lab Results  Component Value Date   ALT 34 03/16/2021   AST 45 (H) 03/16/2021   ALKPHOS 74 03/16/2021   BILITOT 0.8 03/16/2021   Lab Results  Component Value Date   CHOL 142 07/02/2018   HDL 52 07/02/2018   LDLCALC 75 07/02/2018   TRIG 77 07/02/2018   CHOLHDL 2.7 07/02/2018    Lab Results  Component Value Date   HGBA1C 6.6 (H) 07/09/2018    Review of Prior Studies:(Copied from Dr. Allison Quarry last office note). TTE 07/07/2013: EF  25-30%.  Mild to moderate LVH.  Severely reduced EF.  GRII DD.  Severe HK/scarring in mid inferior wall  and mild HK and scarring of basal inferolateral wall, with otherwise mild to moderate hypokinesis diffusely.  Probable severe HK and scarring in the mid inferolateral wall.  Moderate MR.  (Likely functional).  Moderate-severely dilated LA.  Difficult to assess due to frequent ectopy.--Suggests ischemic cardiomyopathy due to prior inferior infarction. Right and Left Heart Cath 07/06/2013: (Admitted with sudden onset chest pain pressure with dyspnea.  Elevated proBNP-CHF.  EF 20 to 25% with inferior HK and consistent with infarction-Myoview indicated EF 29% with inferior scar; also had frequent episodes of nonsustained VT);  LM-LAD (small prox D1& large D2), small RI, LCx (small OM1 in hairpin - then bifurcates to OM2 & AVGCx-> 3 PL branches. RHC: RAP 3-5 mmHg, RVP 57/2 mmHg - EDP 5 mmHg. PAP-Mean 40/13 - 21 mmHg  & PCWP 11 mmHg; LVEDP 11 mmHg. FICK CO-CI 4.2 - 2.4; TD 3.1 - 1.7. TTE 01/04/2014: Mild LVH.  EF 30 to 35%.  Global HK predominantly inferolateral HK.  Moderate MR-posteriorly directed. TTE May 2016: Severe increased LV function.  Diffuse HK.  Moderately dilation.  Moderate MR.  GR 3 DD.  Mildly increased PA pressures Event Monitor 05/15/2017: Baseline sinus rhythm.  Relatively frequent PVCs and PACs usually isolated with occasional couplets and triplets.  2 episodes of narrow complex tachycardia rates between 180 to 200 bpm most likely PAT/SVT.. TTE 05/12/2017: EF 55% with marked improvement.  GR 1 DD.Marland Kitchen TTE 07/02/2018: Moderate concentric LVH.  EF 40 to 45% with diffuse HK.  GRII DD.  Mild dilation.  Moderate MR.  Mild AI. TTE 11/07/2019: Moderate LVH.  EF 45 to 50%.  Mild LV dilation with diffuse HK-worsening symptoms.  Normal RV.  Normal atriae.  Moderate MAC with calcification of the leaflets.  Degenerative mitral valve disease with moderate MR.  Aortic stenosis without sclerosis.  Assessment & Plan   1.  ***    Current medicines are reviewed at length with the patient today.  I have spent ***  min's  dedicated to the care of this patient on the date of this encounter to include pre-visit review of records, assessment, management and diagnostic testing,with shared decision making. Signed, Phill Myron. West Pugh, ANP, AACC   10/18/2022 1:11 PM      Office [phone] Fax [phone]  Notice: This dictation was prepared with Dragon dictation along with smaller phrase technology. Any transcriptional errors that result from this process are unintentional and may not be corrected upon review.

## 2022-10-19 ENCOUNTER — Ambulatory Visit: Payer: Medicare Other | Attending: Cardiology | Admitting: Adult Health

## 2022-11-29 ENCOUNTER — Ambulatory Visit: Payer: Medicare Other | Attending: Cardiology | Admitting: Nurse Practitioner

## 2022-11-29 NOTE — Progress Notes (Deleted)
Office Visit    Patient Name: Billy Parker Date of Encounter: 11/29/2022  Primary Care Provider:  Nolene Ebbs, MD Primary Cardiologist:  Glenetta Hew, MD  Chief Complaint    86 year old male with a history of minimal nonobstructive CAD, chronic systolic heart failure with improved EF, NICM, NSVT, frequent PVCs, mitral valve regurgitation, hypertension, renal cancer, and cocaine abuse who presents for follow-up related to heart failure.  Past Medical History    Past Medical History:  Diagnosis Date   Abnormal nuclear stress test, 07/07/13 large scar no ischemia; FALSE POSITIVE 07/08/2013   LHC - NORMAL CORONARY ARTERIES!!!.     Arthritis    "bad in my back & in my right forearm" (07/06/2013)   At risk for sudden cardiac death 2013/07/15   Chronic lower back pain    Cocaine abuse ([place])    He claims that he just "ends up doing it when he is around his friends" - NOT AS THOUGH HE MUST DO IT.   ETOH abuse    Hypertension    Hypokalemia 07/07/2013   NICM (nonischemic cardiomyopathy) ([place]) 07/08/2013   in setting of acute Combined CHF - EF 20-25% Echo - Myoview EF 29% with Inferior Scar --> Cath NORMAL COROARY ARTERIES. => Most Recent Echo (11/2019) -EF 45-50%.    NSVT (nonsustained ventricular tachycardia) ([place]) 07/09/2013   with Acute CHF presentation - (? related to PSA related coronary spasm vs. Takotsubo CM) - EF was 20-25%; NICM - normal coronaries on Cath.; NO ICD   PVC's (premature ventricular contractions)    Renal cancer, right [facility])    Past Surgical History:  Procedure Laterality Date   CARDIAC EVENT MONITOR  04/2017   Baseline sinus rhythm.  Relatively frequent PVCs and PACs usually isolated with occasional couplets and triplets.  2 episodes of narrow complex tachycardia rates between 180 to 200 bpm most likely PAT/SVT.Marland Kitchen   FEMORAL HERNIA REPAIR Bilateral 06/11/2014   Procedure: LAPAROSCOPIC BILATERAL FEMORAL HERNIA REPAIR WITH MESH;  Surgeon: Adin Hector, MD;   Location: [place];  Service: General;  Laterality: Bilateral;   FOREARM FRACTURE SURGERY Right ?1970   " in [place]" (07/06/2013)   INGUINAL HERNIA REPAIR Left 02/2012   open w mesh.  Incarcerated w colon.  Dr Rise [name]   INGUINAL HERNIA REPAIR Right 06/11/2014   Procedure: LAPAROSCOPIC RIGHT INGUINAL HERNIA WITH MESH ;  Surgeon: Adin Hector, MD;  Location: [place];  Service: General;  Laterality: Right;   LACERATION REPAIR  07/09/1957   anterior throat (07/06/2013)   LEFT AND RIGHT HEART CATHETERIZATION WITH CORONARY ANGIOGRAM N/A 07/09/2013   Procedure: LEFT AND RIGHT HEART CATHETERIZATION WITH CORONARY ANGIOGRAM;  Surgeon: Leonie Man, MD;  Location: [facility] CATH LAB;--  MYOVIEW c/w Inf Infarct EF ~20-30%: NL CORS:LM-<LAD (small prox D1& large D2), small RI, < LCx (sm OM1 in hairpin turn -> < OM2 & AVCx-> 3 PLs; RHC:RAP 4 mmHg, RVP-EDP 57/2 -5 mmHg; PAP-mean 40/13 - 21 mmHg, PCWP/LVEDP 11 mmHg, CO-CI Fick: 4.2 - 2.4, TD 3.1 - 1.7    [place]   "steering wheel crushed it" (07/06/2013)   TONSILLECTOMY  1940's   "I guess" (07/06/2013)   TRANSTHORACIC ECHOCARDIOGRAM  04/2017; 06/2018   a) EF 55% with marked improvement.  GR 1 DD.;; b) Moderate concentric LVH.  EF 40 to 45% with diffuse HK.  GRII DD.  Mild dilation.  Moderate MR.  Mild AI.   TRANSTHORACIC ECHOCARDIOGRAM  11/07/2019   Moderate LVH.  EF 45 to 50%.  Mild LV dilation with diffuse HK-worsening symptoms.  Normal RV.  Normal atriae.  Moderate MAC with calcification of the leaflets.  Degenerative mitral valve disease with moderate MR.  Aortic stenosis without sclerosis.   TRANSTHORACIC ECHOCARDIOGRAM  07/07/2013   ild to moderate LVH.  Severely reduced EF.  GRII DD.  Severe HK/scarring in mid inferior wall and mild HK and scarring of basal inferolateral wall, with otherwise mild to moderate hypokinesis diffusely.  Probable severe HK and scarring in the mid inferolateral wall.  Moderate MR.  (Likely functional).  Moderate-severely  dilated LA.  Difficult to assess w/ frequent ectopy.--Suggest Isch CM - Inf MI   TRANSTHORACIC ECHOCARDIOGRAM  12/2013; 05/2015   a)Mild LVH.  EF 30 to 35%.  Global HK predominantly infLat HK.  Mod MR-posteriorly directed.;  b) Severe increased LV function.  Diffuse HK.  Mod LA dilation.  Mod  MR.  GR 3 DD.  Mildly increased PA P   TRANSURETHRAL RESECTION OF PROSTATE N/A 08/13/2020   Procedure: TRANSURETHRAL RESECTION OF THE PROSTATE (TURP) abcess;  Surgeon: Raynelle Bring, MD;  Location: [facility];  Service: Urology;  Laterality: N/A;   UMBILICAL HERNIA REPAIR N/A 06/11/2014   Procedure: PRIMARY UMBILICAL HERNIA REPAIR;  Surgeon: Adin Hector, MD;  Location: [place];  Service: General;  Laterality: N/A;    Allergies  Allergies  Allergen Reactions   Other Swelling    Antibiotic (name not recalled by the patient) = both legs became swollen    History of Present Illness    86 year old male with the above past medical history including minimal nonobstructive CAD, chronic systolic heart failure with improved EF, NICM, NSVT, frequent PVCs, mitral valve regurgitation, hypertension, renal cancer, and cocaine abuse.  He was hospitalized in 2014 in the setting of acute systolic heart failure with EF 25 to 30%.  Myoview suggested inferior scar, however, cardiac catheterization revealed essentially normal coronary arteries.  His hospitalization was complicated by nonsustained V. tach and frequent PVCs.  He was discharged with a LifeVest.  He was not considered an ICD candidate due to history of cocaine use.  Echocardiogram in January 2015 showed EF 30 to 35%, moderate MR.  Cardiac event monitor in 2018 showed frequent PVCs and PACs, 2 episodes of narrow complex tachycardia with rates between 180 to 200 bpm (likely PAT vs SVT).  He had several admissions for CHF from 2019-2021.  He was last seen in the office on 11/11/2020 and was stable from a cardiac standpoint.  He was cleared for nephrectomy procedure at that  time.  Pete echocardiogram in 12/2020 showed EF improved to 60 to 65%, normal LV function, no RWMA, G1 DD, normal RV systolic function, mild mitral valve regurgitation.  He presents today for follow-up.  Since his last visit  Chronic systolic heart failure with improved EF/NICM: Nonobstructive CAD: NSVT/frequent PVCs: Mitral valve regurgitation: Hypertension: Cocaine abuse: Disposition:  Home Medications    Current Outpatient Medications  Medication Sig Dispense Refill   albuterol (PROVENTIL HFA;VENTOLIN HFA) 108 (90 Base) MCG/ACT inhaler Inhale 2 puffs into the lungs every 6 (six) hours as needed for wheezing or shortness of breath. (Patient taking differently: Inhale 1-2 puffs into the lungs every 6 (six) hours as needed for wheezing or shortness of breath.) 1 Inhaler 0   aspirin 81 MG EC tablet Take 1 tablet (81 mg total) by mouth daily. (Patient taking differently: Take 81 mg by mouth at bedtime.) 30 tablet 0   furosemide (LASIX) 40  MG tablet Take 1 tablet (40 mg total) by mouth daily. 30 tablet 0   isosorbide mononitrate (IMDUR) 30 MG 24 hr tablet Take 0.5 tablets (15 mg total) by mouth daily. 30 tablet 0   lisinopril (ZESTRIL) 5 MG tablet Take 1 tablet (5 mg total) by mouth daily. 90 tablet 2   Multiple Vitamin (MULTIVITAMIN WITH MINERALS) TABS tablet Take 1 tablet by mouth daily. 30 tablet 0   potassium chloride (KLOR-CON) 20 MEQ packet Take 20 mEq by mouth daily.     TOBRADEX ophthalmic ointment Place 1 application into both eyes 3 (three) times daily as needed (eyes burning.).      No current facility-administered medications for this visit.     Review of Systems    ***.  All other systems reviewed and are otherwise negative except as noted above.    Physical Exam    VS:  There were no vitals taken for this visit. , BMI There is no height or weight on file to calculate BMI.     GEN: Well nourished, well developed, in no acute distress. HEENT: normal. Neck: Supple, no  JVD, carotid bruits, or masses. Cardiac: RRR, no murmurs, rubs, or gallops. No clubbing, cyanosis, edema.  Radials/DP/PT 2+ and equal bilaterally.  Respiratory:  Respirations regular and unlabored, clear to auscultation bilaterally. GI: Soft, nontender, nondistended, BS + x 4. MS: no deformity or atrophy. Skin: warm and dry, no rash. Neuro:  Strength and sensation are intact. Psych: Normal affect.  Accessory Clinical Findings    ECG personally reviewed by me today - *** - no acute changes.   Lab Results  Component Value Date   WBC 5.3 03/16/2021   HGB 16.5 03/16/2021   HCT 51.7 03/16/2021   MCV 91.7 03/16/2021   PLT 165 03/16/2021   Lab Results  Component Value Date   CREATININE 1.77 (H) 03/20/2021   BUN 32 (H) 03/20/2021   NA 142 03/20/2021   K 4.9 03/20/2021   CL 106 03/20/2021   CO2 26 03/20/2021   Lab Results  Component Value Date   ALT 34 03/16/2021   AST 45 (H) 03/16/2021   ALKPHOS 74 03/16/2021   BILITOT 0.8 03/16/2021   Lab Results  Component Value Date   CHOL 142 07/02/2018   HDL 52 07/02/2018   LDLCALC 75 07/02/2018   TRIG 77 07/02/2018   CHOLHDL 2.7 07/02/2018    Lab Results  Component Value Date   HGBA1C 6.6 (H) 07/09/2018    Assessment & Plan    1.  ***  No BP recorded.  {Refresh Note OR Click here to enter BP  :1}***   Lenna Sciara, NP 11/29/2022, 5:25 AM

## 2022-12-06 ENCOUNTER — Encounter: Payer: Self-pay | Admitting: Nurse Practitioner

## 2022-12-17 ENCOUNTER — Ambulatory Visit: Payer: Medicare Other | Attending: Cardiology | Admitting: Physician Assistant

## 2022-12-17 ENCOUNTER — Encounter: Payer: Self-pay | Admitting: Physician Assistant

## 2022-12-17 VITALS — BP 122/64 | HR 91 | Ht 65.0 in | Wt 150.2 lb

## 2022-12-17 DIAGNOSIS — I10 Essential (primary) hypertension: Secondary | ICD-10-CM | POA: Diagnosis not present

## 2022-12-17 DIAGNOSIS — N2889 Other specified disorders of kidney and ureter: Secondary | ICD-10-CM

## 2022-12-17 DIAGNOSIS — F1910 Other psychoactive substance abuse, uncomplicated: Secondary | ICD-10-CM | POA: Diagnosis not present

## 2022-12-17 DIAGNOSIS — R7303 Prediabetes: Secondary | ICD-10-CM

## 2022-12-17 DIAGNOSIS — I428 Other cardiomyopathies: Secondary | ICD-10-CM

## 2022-12-17 NOTE — Progress Notes (Unsigned)
Cardiology Office Note:    Date:  12/19/2022   ID:  [name], DOB [birth year], MRN [id number]  PCP:  Nolene Ebbs, MD   Man Providers Cardiologist:  Glenetta Hew, MD     Referring MD: Nolene Ebbs, MD   Chief Complaint  Patient presents with   Follow-up    Seen for Dr. Ellyn Hack    History of Present Illness:    Billy Parker is a 86 y.o. male with a hx of nonischemic cardiomyopathy, polysubstance abuse (EtOH and cocaine), HTN, prediabetes, cirrhosis, chronic hep C, NSVT, PVCs and history of renal cancer s/p right nephrectomy.  Patient was seen in the hospital in July 2014 with acute combined systolic and diastolic heart failure, EF 25 to 30% with regional wall motion abnormalities suggesting inferior scar.  Subsequent cardiac cath revealed minimal disease.  He was cocaine positive.  Hospital course complicated by nonsustained VT and PVCs and he was discharged on LifeVest.  He was seen in January 2015 for acute on chronic CHF exacerbation.  EF showed 30 to 35% with moderate MR.  He was found to be not a ICD candidate due to active cocaine use.  Patient was on carvedilol, lisinopril and furosemide at the time.  He was seen for posthospital follow-up in March 2015, however did not show back up again for 8 months for preop clearance prior to right inguinal hernia repair.  He had repeat admission in April 2015 for acute CHF exacerbation.  He was tested positive again for cocaine.  He was readmitted in 2019 for acute on chronic combined CHF after stopping his cardiac medication for 2 weeks.  EF 40 to 45% the time.  He underwent IV diuresis.  He was admitted with dysuria, nausea Anam and is [place] in August 2021.  A CT image at the time revealed right renal mass and large abscess.  He underwent TURP with unroofing of large abscess.  He was not placed on beta-blocker due to tendency to use to cocaine.  Dr. Ellyn Hack recommended repeat echocardiogram.  Lisinopril was increased to  5 mg daily.  He had multiple cancellations of his scheduled echocardiogram.  Eventually he underwent echocardiogram on 01/13/2021 which showed EF 50 to 65%, no regional wall motion abnormality, grade 1 DD, mild MR, severe LAE.  Patient was felt to be acceptable risk to proceed with right nephrectomy from the cardiac perspective.  Unfortunately, according to the last note by Dr. Alinda Money, it has been 6 months since his last imaging study due to repeatedly delayed echocardiogram.  Therefore Dr. Regenia Skeeter recommended he proceed with repeat imaging of CT scan of the chest abdomen and pelvis and has his renal function checked again before considering proceeding with surgery.  He returned on 03/20/2021 for the planned surgery, however his urine drug test was positive for cocaine.  Therefore his surgery was canceled.  He was informed that no surgery can be proceeded unless he stopped cocaine.  Patient presents today for delayed 2-year follow-up.  He appears to be euvolemic on exam.  He denies any recent chest pain or shortness of breath.  EKG showed normal sinus rhythm with right atrial enlargement.  Note from outside facility reviewed, which suggest patient likely has cirrhosis and chronic hepatitis C.  He did not follow-up with Dr. Alinda Money after his surgery was canceled.  I urged him to follow-up with Dr. Alinda Money of urology service to rediscuss the timing of nephrectomy surgery given renal mass.  I will review outside lab work,  I may decide to reduce his dose of diuretic if he is renal function is down.  Overall, he appears to be stable from the cardiac perspective.  Past Medical History:  Diagnosis Date   Abnormal nuclear stress test, 07/07/13 large scar no ischemia; FALSE POSITIVE 07/08/2013   LHC - NORMAL CORONARY ARTERIES!!!.     Arthritis    "bad in my back & in my right forearm" (07/06/2013)   At risk for sudden cardiac death 05-Aug-2013   Chronic lower back pain    Cocaine abuse ([place])    He claims that he just  "ends up doing it when he is around his friends" - NOT AS THOUGH HE MUST DO IT.   ETOH abuse    Hypertension    Hypokalemia 07/07/2013   NICM (nonischemic cardiomyopathy) ([place]) 07/08/2013   in setting of acute Combined CHF - EF 20-25% Echo - Myoview EF 29% with Inferior Scar --> Cath NORMAL COROARY ARTERIES. => Most Recent Echo (11/2019) -EF 45-50%.    NSVT (nonsustained ventricular tachycardia) ([place]) 07/09/2013   with Acute CHF presentation - (? related to PSA related coronary spasm vs. Takotsubo CM) - EF was 20-25%; NICM - normal coronaries on Cath.; NO ICD   PVC's (premature ventricular contractions)    Renal cancer, right [facility])     Past Surgical History:  Procedure Laterality Date   CARDIAC EVENT MONITOR  04/2017   Baseline sinus rhythm.  Relatively frequent PVCs and PACs usually isolated with occasional couplets and triplets.  2 episodes of narrow complex tachycardia rates between 180 to 200 bpm most likely PAT/SVT.Marland Kitchen   FEMORAL HERNIA REPAIR Bilateral 06/11/2014   Procedure: LAPAROSCOPIC BILATERAL FEMORAL HERNIA REPAIR WITH MESH;  Surgeon: Adin Hector, MD;  Location: [facility];  Service: General;  Laterality: Bilateral;   FOREARM FRACTURE SURGERY Right ?1970   " in [place]" (07/06/2013)   INGUINAL HERNIA REPAIR Left 02/2012   open w mesh.  Incarcerated w colon.  Dr Rise [name]   INGUINAL HERNIA REPAIR Right 06/11/2014   Procedure: LAPAROSCOPIC RIGHT INGUINAL HERNIA WITH MESH ;  Surgeon: Adin Hector, MD;  Location: [facility];  Service: General;  Laterality: Right;   LACERATION REPAIR  07/09/1957   anterior throat (07/06/2013)   LEFT AND RIGHT HEART CATHETERIZATION WITH CORONARY ANGIOGRAM N/A 07/09/2013   Procedure: LEFT AND RIGHT HEART CATHETERIZATION WITH CORONARY ANGIOGRAM;  Surgeon: Leonie Man, MD;  Location: [facility] CATH LAB;--  MYOVIEW c/w Inf Infarct EF ~20-30%: NL CORS:LM-<LAD (small prox D1& large D2), small RI, < LCx (sm OM1 in hairpin turn -> < OM2 & AVCx-> 3 PLs; RHC:RAP 4 mmHg, RVP-EDP 57/2  -5 mmHg; PAP-mean 40/13 - 21 mmHg, PCWP/LVEDP 11 mmHg, CO-CI Fick: 4.2 - 2.4, TD 3.1 - 1.7    [place]   "steering wheel crushed it" (07/06/2013)   TONSILLECTOMY  1940's   "I guess" (07/06/2013)   TRANSTHORACIC ECHOCARDIOGRAM  04/2017; 08/05/2018   a) EF 55% with marked improvement.  GR 1 DD.;; b) Moderate concentric LVH.  EF 40 to 45% with diffuse HK.  GRII DD.  Mild dilation.  Moderate MR.  Mild AI.   TRANSTHORACIC ECHOCARDIOGRAM  11/07/2019   Moderate LVH.  EF 45 to 50%.  Mild LV dilation with diffuse HK-worsening symptoms.  Normal RV.  Normal atriae.  Moderate MAC with calcification of the leaflets.  Degenerative mitral valve disease with moderate MR.  Aortic stenosis without sclerosis.   TRANSTHORACIC ECHOCARDIOGRAM  07/07/2013   ild  to moderate LVH.  Severely reduced EF.  GRII DD.  Severe HK/scarring in mid inferior wall and mild HK and scarring of basal inferolateral wall, with otherwise mild to moderate hypokinesis diffusely.  Probable severe HK and scarring in the mid inferolateral wall.  Moderate MR.  (Likely functional).  Moderate-severely dilated LA.  Difficult to assess w/ frequent ectopy.--Suggest Isch CM - Inf MI   TRANSTHORACIC ECHOCARDIOGRAM  12/2013; 05/2015   a)Mild LVH.  EF 30 to 35%.  Global HK predominantly infLat HK.  Mod MR-posteriorly directed.;  b) Severe increased LV function.  Diffuse HK.  Mod LA dilation.  Mod  MR.  GR 3 DD.  Mildly increased PA P   TRANSURETHRAL RESECTION OF PROSTATE N/A 08/13/2020   Procedure: TRANSURETHRAL RESECTION OF THE PROSTATE (TURP) abcess;  Surgeon: Raynelle Bring, MD;  Location: [facility];  Service: Urology;  Laterality: N/A;   UMBILICAL HERNIA REPAIR N/A 06/11/2014   Procedure: PRIMARY UMBILICAL HERNIA REPAIR;  Surgeon: Adin Hector, MD;  Location: [facility];  Service: General;  Laterality: N/A;    Current Medications: Current Meds  Medication Sig   aspirin 81 MG EC tablet Take 1 tablet (81 mg total) by mouth daily. (Patient taking  differently: Take 81 mg by mouth at bedtime.)   furosemide (LASIX) 40 MG tablet Take 1 tablet (40 mg total) by mouth daily.   isosorbide mononitrate (IMDUR) 30 MG 24 hr tablet Take 0.5 tablets (15 mg total) by mouth daily.   Multiple Vitamin (MULTIVITAMIN WITH MINERALS) TABS tablet Take 1 tablet by mouth daily.   potassium chloride (KLOR-CON) 20 MEQ packet Take 20 mEq by mouth daily.   TOBRADEX ophthalmic ointment Place 1 application into both eyes 3 (three) times daily as needed (eyes burning.).      Allergies:   Other   Social History   Socioeconomic History   Marital status: Divorced    Spouse name: Not on file   Number of children: Not on file   Years of education: Not on file   Highest education level: Not on file  Occupational History   Not on file  Tobacco Use   Smoking status: Never   Smokeless tobacco: Never  Vaping Use   Vaping Use: Never used  Substance and Sexual Activity   Alcohol use: Yes    Alcohol/week: 8.0 standard drinks of alcohol    Types: 8 Cans of beer per week    Comment: (Maybe 1 or 2 40 oz cans) occasional - on Friday -> but can sometimes drink to binge   Drug use: Yes    Types: "Crack" cocaine, Marijuana, Cocaine    Comment: Last used cocaine week of 03/06/21   Sexual activity: Yes    Partners: Female    Birth control/protection: Other-see comments    Comment: often unprotected.   Other Topics Concern   Not on file  Social History Narrative   He tends to be influenced by his friends, and ends up getting himself into trouble by using drugs when he was initially setting up do so.   Social Determinants of Health   Financial Resource Strain: Not on file  Food Insecurity: Not on file  Transportation Needs: Not on file  Physical Activity: Not on file  Stress: Not on file  Social Connections: Not on file     Family History: The patient's family history includes CAD in his sister; Cancer - Other in his mother.  ROS:   Please see the history of  present illness.  All other systems reviewed and are negative.  EKGs/Labs/Other Studies Reviewed:    The following studies were reviewed today:  Echo 01/13/2021  1. Left ventricular ejection fraction, by estimation, is 60 to 65%. The  left ventricle has normal function. The left ventricle has no regional  wall motion abnormalities. There is moderate concentric left ventricular  hypertrophy. Left ventricular  diastolic parameters are consistent with Grade I diastolic dysfunction  (impaired relaxation).   2. Right ventricular systolic function is normal. The right ventricular  size is normal.   3. Left atrial size was severely dilated.   4. The mitral valve is normal in structure. Mild mitral valve  regurgitation. No evidence of mitral stenosis.   5. The aortic valve is tricuspid. There is mild calcification of the  aortic valve. There is mild thickening of the aortic valve. Aortic valve  regurgitation is not visualized. No aortic stenosis is present.   6. The inferior vena cava is normal in size with greater than 50%  respiratory variability, suggesting right atrial pressure of 3 mmHg.   EKG:  EKG is ordered today.  The ekg ordered today demonstrates normal sinus rhythm, right atrial enlargement, occasional PAC and PVCs.  Recent Labs: No results found for requested labs within last 365 days.  Recent Lipid Panel    Component Value Date/Time   CHOL 142 07/02/2018 0541   TRIG 77 07/02/2018 0541   HDL 52 07/02/2018 0541   CHOLHDL 2.7 07/02/2018 0541   VLDL 15 07/02/2018 0541   LDLCALC 75 07/02/2018 0541     Risk Assessment/Calculations:           Physical Exam:    VS:  BP 122/64   Pulse 91   Ht _0  (1.651 m)   Wt 150 lb 3.2 oz (68.1 kg)   SpO2 96%   BMI 24.99 kg/m        Wt Readings from Last 3 Encounters:  12/17/22 150 lb 3.2 oz (68.1 kg)  03/16/21 162 lb (73.5 kg)  11/11/20 160 lb (72.6 kg)     GEN:  Well nourished, well developed in no acute  distress HEENT: Normal NECK: No JVD; No carotid bruits LYMPHATICS: No lymphadenopathy CARDIAC: RRR, no murmurs, rubs, gallops RESPIRATORY:  Clear to auscultation without rales, wheezing or rhonchi  ABDOMEN: Soft, non-tender, non-distended MUSCULOSKELETAL:  No edema; No deformity  SKIN: Warm and dry NEUROLOGIC:  Alert and oriented x 3 PSYCHIATRIC:  Normal affect   ASSESSMENT:    1. Non-ischemic cardiomyopathy ([place])   2. Essential hypertension   3. Prediabetes   4. Polysubstance abuse ([place])   5. Renal mass, right    PLAN:    In order of problems listed above:  Nonischemic cardiomyopathy: EF improved to 60 to 65% by January 2022.  He is still on 40 mg daily of Lasix, it appears his creatinine significantly worsened from the previous 1.1 in August 2021 to 1.6 in March 2022 and has since stayed around 1.6-1.7 range.  Will reduce Lasix to 20 mg daily with instruction of taking additional 20 mg as needed. Most recent KCl was 5.2, given reduced diuretic, will stop the 20 meq KCl dosing. Repeat BMET in 2-4 weeks  Hypertension: Blood pressure stable  Prediabetes: Followed by PCP  History of polysubstance abuse: Continue to use cocaine and alcohol.  Right renal mass: He was supposed to undergo right nephrectomy surgery, however due to positive urine drug test, surgery was canceled.  I urged him to stop using cocaine  and follow-up with Dr. Alinda Money to rediscuss future surgery to remove the right renal mass.           Medication Adjustments/Labs and Tests Ordered: Current medicines are reviewed at length with the patient today.  Concerns regarding medicines are outlined above.  Orders Placed This Encounter  Procedures   EKG 12-Lead   No orders of the defined types were placed in this encounter.   Patient Instructions  Medication Instructions:  No Changes *If you need a refill on your cardiac medications before your next appointment, please call your pharmacy*   Lab Work: No  Labs If you have labs (blood work) drawn today and your tests are completely normal, you will receive your results only by: [place] (if you have MyChart) OR A paper copy in the mail If you have any lab test that is abnormal or we need to change your treatment, we will call you to review the results.   Testing/Procedures: No Testing   Follow-Up: At [facility], you and your health needs are our priority.  As part of our continuing mission to provide you with exceptional heart care, we have created designated Provider Care Teams.  These Care Teams include your primary Cardiologist (physician) and Advanced Practice Providers (APPs -  Physician Assistants and Nurse Practitioners) who all work together to provide you with the care you need, when you need it.  We recommend signing up for the patient portal called "MyChart".  Sign up information is provided on this After Visit Summary.  MyChart is used to connect with patients for Virtual Visits (Telemedicine).  Patients are able to view lab/test results, encounter notes, upcoming appointments, etc.  Non-urgent messages can be sent to your provider as well.   To learn more about what you can do with MyChart, go to [email or web address].    Your next appointment:   6 month(s)  The format for your next appointment:   In Person  Provider:   Glenetta Hew, MD     Signed, Almyra Deforest, [place]  12/19/2022 3:49 PM    [place]

## 2022-12-17 NOTE — Patient Instructions (Signed)
Medication Instructions:  No Changes *If you need a refill on your cardiac medications before your next appointment, please call your pharmacy*   Lab Work: No Labs If you have labs (blood work) drawn today and your tests are completely normal, you will receive your results only by: [place] (if you have MyChart) OR A paper copy in the mail If you have any lab test that is abnormal or we need to change your treatment, we will call you to review the results.   Testing/Procedures: No Testing   Follow-Up: At [facility], you and your health needs are our priority.  As part of our continuing mission to provide you with exceptional heart care, we have created designated Provider Care Teams.  These Care Teams include your primary Cardiologist (physician) and Advanced Practice Providers (APPs -  Physician Assistants and Nurse Practitioners) who all work together to provide you with the care you need, when you need it.  We recommend signing up for the patient portal called "MyChart".  Sign up information is provided on this After Visit Summary.  MyChart is used to connect with patients for Virtual Visits (Telemedicine).  Patients are able to view lab/test results, encounter notes, upcoming appointments, etc.  Non-urgent messages can be sent to your provider as well.   To learn more about what you can do with MyChart, go to [email or web address].    Your next appointment:   6 month(s)  The format for your next appointment:   In Person  Provider:   Glenetta Hew, MD

## 2022-12-19 ENCOUNTER — Encounter: Payer: Self-pay | Admitting: Physician Assistant

## 2023-12-13 ENCOUNTER — Encounter: Payer: Self-pay | Admitting: Cardiology

## 2024-12-31 DEATH — deceased
